# Patient Record
Sex: Female | Born: 1935 | Race: White | Hispanic: No | Smoking: Never smoker
Health system: Southern US, Community
[De-identification: ages and names within clinical notes are randomized; demographics above are authoritative.]

## PROBLEM LIST (undated history)

## (undated) DIAGNOSIS — N3020 Other chronic cystitis without hematuria: Secondary | ICD-10-CM

## (undated) DIAGNOSIS — R339 Retention of urine, unspecified: Secondary | ICD-10-CM

## (undated) DIAGNOSIS — F0390 Unspecified dementia without behavioral disturbance: Secondary | ICD-10-CM

## (undated) DIAGNOSIS — I10 Essential (primary) hypertension: Secondary | ICD-10-CM

## (undated) DIAGNOSIS — N3941 Urge incontinence: Secondary | ICD-10-CM

## (undated) DIAGNOSIS — N952 Postmenopausal atrophic vaginitis: Secondary | ICD-10-CM

## (undated) DIAGNOSIS — L89309 Pressure ulcer of unspecified buttock, unspecified stage: Secondary | ICD-10-CM

## (undated) DIAGNOSIS — I4891 Unspecified atrial fibrillation: Secondary | ICD-10-CM

## (undated) DIAGNOSIS — K219 Gastro-esophageal reflux disease without esophagitis: Secondary | ICD-10-CM

## (undated) DIAGNOSIS — R001 Bradycardia, unspecified: Secondary | ICD-10-CM

## (undated) DIAGNOSIS — E785 Hyperlipidemia, unspecified: Secondary | ICD-10-CM

## (undated) DIAGNOSIS — N390 Urinary tract infection, site not specified: Secondary | ICD-10-CM

## (undated) DIAGNOSIS — R3916 Straining to void: Secondary | ICD-10-CM

## (undated) DIAGNOSIS — Z950 Presence of cardiac pacemaker: Secondary | ICD-10-CM

## (undated) DIAGNOSIS — R3129 Other microscopic hematuria: Secondary | ICD-10-CM

## (undated) HISTORY — PX: PACEMAKER INSERTION: SHX728

---

## 1998-07-15 ENCOUNTER — Inpatient Hospital Stay (HOSPITAL_COMMUNITY): Admission: EM | Admit: 1998-07-15 | Discharge: 1998-07-22 | Payer: Self-pay | Admitting: Internal Medicine

## 2002-04-26 ENCOUNTER — Ambulatory Visit (HOSPITAL_COMMUNITY): Admission: RE | Admit: 2002-04-26 | Discharge: 2002-04-26 | Payer: Self-pay | Admitting: Internal Medicine

## 2002-04-26 ENCOUNTER — Encounter (INDEPENDENT_AMBULATORY_CARE_PROVIDER_SITE_OTHER): Payer: Self-pay | Admitting: Internal Medicine

## 2003-05-28 ENCOUNTER — Ambulatory Visit (HOSPITAL_COMMUNITY): Admission: RE | Admit: 2003-05-28 | Discharge: 2003-05-28 | Payer: Self-pay | Admitting: Family Medicine

## 2003-05-28 ENCOUNTER — Encounter: Payer: Self-pay | Admitting: Family Medicine

## 2003-10-13 ENCOUNTER — Inpatient Hospital Stay (HOSPITAL_COMMUNITY): Admission: EM | Admit: 2003-10-13 | Discharge: 2003-10-22 | Payer: Self-pay | Admitting: Emergency Medicine

## 2003-10-22 ENCOUNTER — Inpatient Hospital Stay: Admission: AD | Admit: 2003-10-22 | Discharge: 2003-11-07 | Payer: Self-pay | Admitting: Internal Medicine

## 2004-04-28 HISTORY — PX: CARDIAC CATHETERIZATION: SHX172

## 2004-04-30 ENCOUNTER — Ambulatory Visit (HOSPITAL_COMMUNITY): Admission: RE | Admit: 2004-04-30 | Discharge: 2004-04-30 | Payer: Self-pay | Admitting: *Deleted

## 2004-05-03 ENCOUNTER — Inpatient Hospital Stay (HOSPITAL_BASED_OUTPATIENT_CLINIC_OR_DEPARTMENT_OTHER): Admission: RE | Admit: 2004-05-03 | Discharge: 2004-05-03 | Payer: Self-pay | Admitting: Cardiology

## 2004-05-20 ENCOUNTER — Ambulatory Visit (HOSPITAL_COMMUNITY): Admission: RE | Admit: 2004-05-20 | Discharge: 2004-05-20 | Payer: Self-pay | Admitting: *Deleted

## 2005-03-09 ENCOUNTER — Ambulatory Visit (HOSPITAL_COMMUNITY): Admission: RE | Admit: 2005-03-09 | Discharge: 2005-03-09 | Payer: Self-pay | Admitting: *Deleted

## 2005-04-12 ENCOUNTER — Ambulatory Visit (HOSPITAL_COMMUNITY): Admission: RE | Admit: 2005-04-12 | Discharge: 2005-04-12 | Payer: Self-pay | Admitting: *Deleted

## 2005-06-14 ENCOUNTER — Ambulatory Visit (HOSPITAL_COMMUNITY): Admission: RE | Admit: 2005-06-14 | Discharge: 2005-06-14 | Payer: Self-pay | Admitting: Cardiovascular Disease

## 2005-08-04 ENCOUNTER — Emergency Department (HOSPITAL_COMMUNITY): Admission: EM | Admit: 2005-08-04 | Discharge: 2005-08-04 | Payer: Self-pay | Admitting: Emergency Medicine

## 2006-04-26 ENCOUNTER — Emergency Department (HOSPITAL_COMMUNITY): Admission: EM | Admit: 2006-04-26 | Discharge: 2006-04-26 | Payer: Self-pay | Admitting: Emergency Medicine

## 2006-04-27 ENCOUNTER — Ambulatory Visit: Payer: Self-pay | Admitting: Orthopedic Surgery

## 2006-06-08 ENCOUNTER — Ambulatory Visit: Payer: Self-pay | Admitting: Orthopedic Surgery

## 2006-09-16 ENCOUNTER — Inpatient Hospital Stay (HOSPITAL_COMMUNITY): Admission: EM | Admit: 2006-09-16 | Discharge: 2006-09-21 | Payer: Self-pay | Admitting: Emergency Medicine

## 2006-09-26 ENCOUNTER — Inpatient Hospital Stay (HOSPITAL_COMMUNITY): Admission: AD | Admit: 2006-09-26 | Discharge: 2006-09-30 | Payer: Self-pay | Admitting: Cardiovascular Disease

## 2007-01-03 ENCOUNTER — Ambulatory Visit (HOSPITAL_COMMUNITY): Admission: RE | Admit: 2007-01-03 | Discharge: 2007-01-03 | Payer: Self-pay | Admitting: Cardiovascular Disease

## 2007-12-13 ENCOUNTER — Ambulatory Visit (HOSPITAL_COMMUNITY): Admission: RE | Admit: 2007-12-13 | Discharge: 2007-12-13 | Payer: Self-pay | Admitting: Internal Medicine

## 2008-07-07 ENCOUNTER — Emergency Department (HOSPITAL_COMMUNITY): Admission: EM | Admit: 2008-07-07 | Discharge: 2008-07-07 | Payer: Self-pay | Admitting: Emergency Medicine

## 2008-12-25 ENCOUNTER — Ambulatory Visit (HOSPITAL_COMMUNITY): Admission: RE | Admit: 2008-12-25 | Discharge: 2008-12-25 | Payer: Self-pay | Admitting: Internal Medicine

## 2010-10-25 ENCOUNTER — Emergency Department (HOSPITAL_COMMUNITY)
Admission: EM | Admit: 2010-10-25 | Discharge: 2010-10-25 | Payer: Self-pay | Source: Home / Self Care | Admitting: Emergency Medicine

## 2011-04-15 NOTE — Discharge Summary (Signed)
[name], [name]                ACCOUNT NO.:  [id number]   MEDICAL RECORD NO.:  [id number]          PATIENT TYPE:  INP   LOCATION:  6524                         FACILITY:  [facility]   PHYSICIAN:  Raymon Mutton, P.A. DATE OF BIRTH:  [birth year]   DATE OF ADMISSION:  09/16/2006  DATE OF DISCHARGE:  09/21/2006                                 DISCHARGE SUMMARY   DISCHARGE DIAGNOSIS:  1. Sick sinus syndrome.  2. Paroxysmal atrial fibrillation.  3. Status post permanent transvenous pacemaker that is a dual-chambered      device implanted by Dr. Jenne Campus on September 19, 2006.  4. Peripheral vascular disease with a history of left internal carotid      artery stenosis.  5. Depression.  6. Anticoagulation therapy with Coumadin.   HOSPITAL COURSE:  This is a 77 year old Caucasian female who was seen in the  office with complaints of weakness and dizziness.  She was wearing an event  monitor revealing 5- to 6-second pauses with intermittent episodes of  tachycardia.  Based on that finding, patient was scheduled for the pacer  implant.  She actually was requested to come to [facility] after  her event monitor showed  a systolic pauses.  Because patient was on  Coumadin, she was admitted for the Coumadin Heparin bridge.  Her INR drifted  to 1.5 on September 18, 2006, and then into the next day, Dr. Jenne Campus  performed a pacer placement.  It was the mid joining device serial number  vagal lead is [id number] and ventricular lead serial number [id number].  Generator was in the rhythm device with serial number [id number].  Patient  tolerated the procedure well.  The next morning, the pacer was interrogated  and showed all normal parameters, no changes were made.  Patient underwent  chest x-ray on the morning of September 20, 2006, and x-ray showed a possible  small pneumothorax in the upper left lobe.  Patient remained asymptomatic.  She did not have any shortness of breath, or any chest pain.   We held her in  the hospital for observation 24 extra hours and the next morning on September 21, 2006, she underwent another x-ray that showed no changes in appearance  of the left apical area.  Patient remained stable.  Her pacer site did not  have any signs of inflammation or infection and Steri-Strips remained  intact.   DISCHARGE MEDICATIONS:  1. Lisinopril 5 mg every day.  2. Lexapro 10 mg every day.  3. Omeprazole 20 mg every day.  4. Cardizem 240 mg every day.  5. Lopressor 25 mg a day.  6. Coumadin as before, home dose.   DISCHARGE DIET:  Low-salt, low-fat diet.   DISCHARGE INSTRUCTIONS:  Patient was instructed to keep incision dry and do  not remove Steri-Strips unless they roll off themselves.  Avoid any overhead  reaching for a week and do not wet incision site until seen in the office.   FOLLOWUP:  Followup appointment for pacer site check up scheduled on September 27, 2006, at 2:30 and she will be seen by  a PA, [facility].   Patient will have her INR checked in a week prior to coming to our office.      Raymon Mutton, P.A.     MK/MEDQ  D:  09/21/2006  T:  09/22/2006  Job:  [id number]   cc:   [facility]  Darlin Priestly, MD  Dani Gobble, MD

## 2011-04-15 NOTE — Cardiovascular Report (Signed)
NAME:  [name], [name]                          ACCOUNT NO.:  [id number]   MEDICAL RECORD NO.:  [id number]                   PATIENT TYPE:  OIB   LOCATION:  6501                                 FACILITY:  [facility]   PHYSICIAN:  Salvadore Farber, M.D. [facility]         DATE OF BIRTH:  07/11/1927   DATE OF PROCEDURE:  05/03/2004  DATE OF DISCHARGE:  05/03/2004                              CARDIAC CATHETERIZATION   PROCEDURE:  Left and right heart catheterization, left ventriculography,  coronary angiography.   INDICATION:  [name] is a 77 year old lady who presented in heart failure  in the setting of paroxysmal atrial fibrillation in November.  Ejection  fraction was demonstrated to be approximately 25% by echocardiography.  She  has continued to suffer profound fatigue.  She has been referred for right  and left heart catheterization to assess pressures and exclude an etiology  to her heart failure.   PROCEDURAL TECHNIQUE:  Informed consent was obtained.  Under 1% lidocaine  local anesthesia, a 4 French sheath was placed in the right common femoral  artery and a 7 French sheath in the right common femoral vein using the  modified Seldinger technique.  Right heart catheterization was performed  using a balloon tipped catheter.  Pressures were measured and cardiac output  was calculated using a thermodilution method.  Coronary angiography was then  performed using JL-4 and JR-4 catheters.  Finally, left heart  catheterization and ventriculography was performed using a pigtail catheter.  The patient tolerated the procedure well and was transferred to the holding  room in stable condition.  Sheaths will be removed there.   COMPLICATIONS:  None.   FINDINGS:   HEMODYNAMICS:  RA 5/3/2, RV 30/0/4, PA 28/7/15, PCW 11/8/8, LV 145/2/12,  aorta 136/46/77, cardiac output/index 3.7/2.4.   VENTRICULOGRAPHY:  EF 50% without regional wall motion abnormality.  No  mitral regurgitation.   CORONARY  ANGIOGRAPHY:  Fluoroscopy demonstrates the proximal portions of the  LAD and circumflex to be heavily calcified.  1. Left main:  Angiographically normal.  2. LAD:  Heavily calcified proximally, but only a 20% stenosis of the mid     vessel.  The LAD is a large vessel wrapping the apex of the heart to     supply much of the inferior wall.  It gives rise to a single diagonal     branch.  3. Circumflex:  Moderate size vessel giving rise to two obtuse marginals.     It is heavily calcified proximally, but angiographically normal.  4. RCA:  Moderate size dominant vessel.  It is angiographically normal.   IMPRESSION/RECOMMENDATIONS:  There is no significant coronary disease.  Left  ventricular systolic function is improved markedly compared with  echocardiogram November 2004.  Her filling pressures are markedly low which  may be contributing to her fatigue.  After discussion with Dr. Dorethea Clan, I  will discontinue her Lasix and K-Dur.  She will follow  up with Dr. Dorethea Clan in  approximately two weeks.                                               Salvadore Farber, M.D. [facility]    WED/MEDQ  D:  05/03/2004  T:  05/03/2004  Job:  [id number]   cc:   Tesfaye D. Felecia Shelling, M.D.  [address]  [place]  [place] [address]  Fax: [phone]   Vida Roller, M.D.  Fax: [phone]

## 2011-04-15 NOTE — Procedures (Signed)
NAME:  [name], [name]                          ACCOUNT NO.:  [id number]   MEDICAL RECORD NO.:  [id number]                   PATIENT TYPE:  INP   LOCATION:  IC04                                 FACILITY:  [facility]   PHYSICIAN:  Vida Roller, M.D.                DATE OF BIRTH:  Dec 25, 1926   DATE OF PROCEDURE:  DATE OF DISCHARGE:                                  ECHOCARDIOGRAM   PRIMARY CARE Breana Litts:  Tesfaye D. Felecia Shelling, M.D.   TAPE NUMBER:  [id number].   TAPE COUNT:  [id number] through [phone].   This is a 77 year old woman with atrial fibrillation and congestive heart  failure.  The technical quality of the study is adequate.  The patient is in  atrial fibrillation.   M-MODE TRACINGS:  The aorta is 31 mm.   The left atrium is 44 mm.   The septum is 10 mm.   The posterior wall is 10 mm.   The left ventricular diastolic dimension is 48 mm.   The left ventricular systolic dimension is 41 mm.   2-D AND DOPPLER IMAGING:  The left ventricle is mildly dilated with evidence  of severely depressed left ventricular ejection fraction.  The overall  ejection fraction is estimated at 25-30%.  There is global hypokinesis with  no areas of akinesis.  Diastolic function is not assessed due to the  severity of the systolic function.   The right ventricle appears to be normal size with normal systolic function.   Both atriae appear to be moderately enlarged.  There is no atrioseptal  defect.   The aortic valve is mildly sclerotic with evidence of mild insufficiency and  no stenosis is seen.   The mitral valve shows mild annular calcification with evidence of mild to  moderate mitral regurgitation.   The tricuspid valve is morphologically unremarkable with moderate  insufficiency.  No stenosis is seen.   The pulmonic valve was not well seen.   The ascending aorta was not well seen.   The pericardial structures appear normal.  There is a very small  circumferential pericardial effusion of no  significance.  There is, however,  a small pericardial effusion and two very large pleural effusions.   The inferior vena cava appears to be dilated and mildly plethoric.      ___________________________________________                                            Vida Roller, M.D.   JH/MEDQ  D:  10/14/2003  T:  10/14/2003  Job:  [id number]

## 2011-04-15 NOTE — H&P (Signed)
NAMEBARBARAANN, AVANS                ACCOUNT NO.:  [id number]   MEDICAL RECORD NO.:  [id number]          PATIENT TYPE:  INP   LOCATION:  2916                         FACILITY:  [facility]   PHYSICIAN:  Nicki Guadalajara, M.D.     DATE OF BIRTH:  05/05/1927   DATE OF ADMISSION:  09/16/2006  DATE OF DISCHARGE:                                HISTORY & PHYSICAL   HISTORY OF PRESENT ILLNESS:  Mrs. [name] is a 77 year old white  widowed female patient who came to the emergency room by our request, after  we were called by [facility] because of a rhythm strip showing PAF with rapid  ventricular response and sick sinus syndrome with 5-6 second pauses.  She  was recently seen by Dr. Domingo Sep in the office on September 04, 2006, at which  time she complained of dizziness, tachy palpitations and pre-syncope.  I  believe her Cardizem at that time was decreased.  She does have a history of  mild CAD and ASPVD, with left internal carotid artery stenosis, 50-70%.  She  was ordered as an outpatient to have an echo and stress Myoview; I do not  believe these have been done yet.  She states she does feel like she may  pass out at times.  She is not specifically having any chest pain or  shortness of breath at this time.  Her strip shows that she had her pauses  during the night, at approximately 12:30 and 3 a.m. in the morning.  Currently here in the emergency room she is in sinus rhythm to sinus brady,  rate of 50-60.   Her labs show her hemoglobin is 12, hematocrit is 36.4 and platelets are 217  with WBC of 6.  Her INR is 1.9.  BNP was 303.  Magnesium was 2.3.  BMET is  not back yet.   PAST MEDICAL HISTORY:  1. ASCVD/atherosclerotic coronary vascular disease with a history of      cardiac catheterization in 2005 with heavily calcified proximal      portions of the left anterior descending and circumflex, where the left      main was angiographically normal.  The LAD had a 20% mid vessel      stenosis.  She  had no significant disease in the circumflex or right      coronary artery.  A 2-D echocardiogram done in April of 2006 showed      mild to moderate LAE, as well as mild aortic sclerosis without      stenosis.  She had a normal left ventricular size and vigorous LV      systolic function.  She had mild aortic insufficiency, mild mitral      regurgitation and tricuspid regurgitation.  2. ASPVD/atherosclerotic peripheral vascular disease with history of left      internal carotid stenosis, 50-70%.  3. History of depression.  4. History of paroxysmal atrial fibrillation.   MEDICATIONS:  Lisinopril 5 mg daily, diltiazem 120 mg daily, benazepril 20  mg daily, Remeron 15 mg at bedtime, warfarin 4 mg daily, and Lexapro 10 mg  daily.   ALLERGIES:  NKDA.   FAMILY HISTORY:  She, herself, has coronary disease and is 77 years old.   SOCIAL HISTORY:  She is widowed.  She has 4 children; 2 children she does  not see.  She lives alone in an apartment complex for the elderly.  She does  do housework.  She cleans and cooks, and drives around [place].   REVIEW OF SYSTEMS:  Positive for exertional dyspnea at times.  Positive for  lightheadedness.  Positive for pre-syncope.  No chest pain.  Positive for  depression.  No fever.  No cough or cold.  No indigestion or heartburn.  No  black, tarry stools.   PHYSICAL EXAMINATION:  VITAL SIGNS:  Blood pressure is 98/50, pulse is 76,  and respirations are 18.  GENERAL:  She has a flat affect.  No chest pain.  HEENT:  Pupils equal, round and reactive to light.  NECK:  Left carotid bruit.  No JVD.  RESPIRATORY:  Respirations are without rales.  HEART:  Sounds are regular.  1/6 systolic ejection murmur without S3.  ABDOMEN:  Bowel sounds are present times four.  No mass.  No tenderness.  EXTREMITIES:  Moves all extremities times four.  NEUROLOGIC:  Alert and oriented times three.  LYMPHATICS:  No lymphadenopathy.   ASSESSMENT:  1. Sick sinus syndrome  with paroxysmal atrial fibrillation, rapid      ventricular response and 5-6 second pauses.  2. Mild coronary artery disease without chest pain at this time.  3. Anticoagulation, on Coumadin with an INR of 1.9.  4. Normal left ventricular function.  5. Depression.  6. History of atherosclerotic peripheral vascular disease with history of      left internal carotid stenosis.   PLAN:  She was seen and evaluated by Dr. Nicki Guadalajara.  She will be  admitted.  She will need to undergo pacemaker implantation; this will be  scheduled for Tuesday.  Her INR now is 1.9; thus, we will place her on IV  heparin and hold her Coumadin for her PTVDP placement.  We will also hold  her Cardizem-CD and will put her on low-dose Cardizem, short-acting, 30 mg  every 8 hours; only hold if her heart rate is less than 60.      Lezlie Octave, N.P.    ______________________________  Nicki Guadalajara, M.D.    BB/MEDQ  D:  09/16/2006  T:  09/16/2006  Job:  [id number]   cc:   Tesfaye D. Felecia Shelling, MD  Dani Gobble, MD

## 2011-04-15 NOTE — Group Therapy Note (Signed)
NAME:  JAHNI, PAUL                          ACCOUNT NO.:  [id number]   MEDICAL RECORD NO.:  [id number]                   PATIENT TYPE:  INP   LOCATION:  IC04                                 FACILITY:  [facility]   PHYSICIAN:  Edward L. Juanetta Gosling, M.D.             DATE OF BIRTH:  1927/06/21   DATE OF PROCEDURE:  10/16/2003  DATE OF DISCHARGE:                                   PROGRESS NOTE   SUMMARY:  Ms. Marmo says she is feeling okay and has no complaints.  She  remains sluggish and just does not feel well but she does not have any  specific complaints at this time.  Her thoracentesis was done last night and  the fluid looks mostly transudative.  This would be consistent with her  congestive heart failure.  Otherwise, she is doing okay and has no new  complaints.  At this point I will plan to sign off unless something comes up  on the pleural fluid that is unexpected.  Thank you for allowing me to see  her with you.      ___________________________________________                                            Oneal Deputy. Juanetta Gosling, M.D.   ELH/MEDQ  D:  10/16/2003  T:  10/16/2003  Job:  [id number]

## 2011-04-15 NOTE — Op Note (Signed)
[name], DOSHIER                ACCOUNT NO.:  [id number]   MEDICAL RECORD NO.:  [id number]          PATIENT TYPE:  INP   LOCATION:  6524                         FACILITY:  [facility]   PHYSICIAN:  Darlin Priestly, MD  DATE OF BIRTH:  December 08, 1926   DATE OF PROCEDURE:  09/19/2006  DATE OF DISCHARGE:                                 OPERATIVE REPORT   PROCEDURE:  Insertion of a Medtronic EnRhythm generator, model M8124565,  serial [id number], with passive atrial and ventricular leads.   ATTENDING SURGEON:  Darlin Priestly, MD.   COMPLICATIONS:  None.   INDICATIONS:  Miss [name] is a 77 year old female patient of Dr. Reita Chard with a history of PVD, history of noncritical CAD, history of  paroxysmal atrial fibrillation, who recently wore an event monitor revealing  5 to 6-second pauses with intermittent episodes of tachycardia.  She is now  referred for a generator placement secondary to sick sinus syndrome.   DESCRIPTION OF PROCEDURE:  After the patient gave informed written consent,  the patient was brought to the cardiac cath lab, where the left chest was  prepped and draped in sterile fashion.  ECG monitoring was established.  Lidocaine 1% was then used to anesthetize the left midsubclavicular area.  Next, approximately a 3-cm midinfraclavicular incision was then carried out  in a horizontal fashion, and hemostasis was obtained with electrocautery.  Blunt dissection was then used to carry this down to the left pectoral  fascia.  Next, approximately a 3 x 4-cm pocket was then created over the  left pectoral fascia, and again hemostasis was confirmed with  electrocautery.  The left subclavian vein was then visualized with contrast,  and using fluoroscopy as well as a Smart needle, the left subclavian vein  was then entered and a guide wire was then easily passed into the SVC and  right atrium.  Over this, a #9-French stay sheath was inserted, and the  dilator and guide wire  were removed.  Through this, a 52-cm passive  Medtronic lead, model [id number], serial # [id number], was then passed into the  right atrium.  The guide wire was retained and the peel-away sheath was  removed.  A second 7-French dilator and sheath were then placed over the  retained guide wire, and the guide wire and dilator were removed.  Through  this, a 45-cm passive Medtronic lead, model #5594, serial [id number], was  then passed into the right atrium and the peel-away sheath was removed.  Two  silk sutures were then placed at the base of each lead and anchored to the  pectoral fascia.  A J curve was then placed on the ventricular lead stylet  and the ventricular lead was then allowed to pass through the tricuspid  valve and was positioned in the RV apex without difficulty.  Thresholds were  determined.  R waves were measured at 9.5 mV.  Impedance was 807 ohms.  Threshold in the ventricle was 0.4 V at 0.5 msec.  Current was 0.5 mA, and  10 V was negative for diaphragmatic stimulation.  The atrial stylet was  then  pulled back and the preformed J was then allowed to form, and this was  positioned in the right atrial appendage and the thresholds were determined.  The P waves were measured at 1.7 mA.  Impedance was 489 ohms.  Threshold in  the atrium was 1.2 V at 0.5 msec.  Current was 2.9 mA, and 10 V was negative  for diaphragmatic stimulation.  The leads were then sutured into place with  two silk sutures per lead.  The pocket was then copiously irrigated with 1%  kanamycin solution, and again hemostasis was confirmed.  The leads were then  connected in a serial fashion to a Medtronic EnRhythm [id number] generator,  serial [id number].  Header screws were tightened and pacing was confirmed.  A single silk suture was then placed in the apex of the pocket.  The  generator and the leads were then delivered into the pocket, and the header  was secured to the silk suture.  The subcutaneous layer was  then closed  using running 2-0 Vicryl.  The skin layers were then closed using running 4-  0 Vicryl.  Steri-Strips were then applied.  The patient returned to the  recovery room in stable condition.   CONCLUSIONS:  Successful implant of a Medtronic EnRhythm [id number] generator,  serial [id number], with passive atrial and ventricular leads.      Darlin Priestly, MD  Electronically Signed     RHM/MEDQ  D:  09/19/2006  T:  09/19/2006  Job:  [id number]   cc:   Dani Gobble, MD  Tesfaye D. Felecia Shelling, MD

## 2011-04-15 NOTE — Consult Note (Signed)
NAME:  [name], [name]                          ACCOUNT NO.:  [id number]   MEDICAL RECORD NO.:  [id number]                   PATIENT TYPE:  INP   LOCATION:  IC04                                 FACILITY:  [facility]   PHYSICIAN:  Vida Roller, M.D.                DATE OF BIRTH:  1927-11-01   DATE OF CONSULTATION:  10/14/2003  DATE OF DISCHARGE:                                   CONSULTATION   PRIMARY CARE PHYSICIAN:  Tesfaye D. Felecia Shelling, M.D.   HISTORY OF PRESENT ILLNESS:  [name] is a 77 year old female who  presented to the emergency department secondary to failure-to-thrive.  She  was found down in her apartment by EMS and taken to the ED at [facility] where she was found to have atrial fibrillation with a rapid  ventricular response.  She was also severely dehydrated and hyponatremic.  Also complains of a pleuritic cough with discomfort in her chest and  shortness of breath, some lower extremity edema.   PAST MEDICAL HISTORY:  Her past medical history is significant for  hysterectomy diverticulitis, and a history of a hiatal hernia but no  congestive heart failure or coronary artery disease.   SOCIAL HISTORY:  She lives alone in [place].  She is widowed.  She has  four children, none of whom are active in her health care.   ALLERGIES:  She is not allergic to any medications.   HOME MEDICATIONS:  She is on Xanax 0.5mg  a day at night to sleep and iron  replacement for an unknown cause.   HABITS:  She does not smoke, does not drink alcohol, does not do any drugs.   FAMILY HISTORY:  Her family history is limited.  She has one son who has  heart disease status post a bypass surgery at age 67 and her mother died of  thyroid and heart disease at the age of 39.  Her father died in his 22s -  she is not certain of the cause.   HOSPITAL MEDICATIONS:  1. Rocephin 1 gram q.24h.  2. Lovenox 60 mg subcu b.i.d.  3. Remeron 15 mg at night for sleep.  4. Potassium chloride 40  mEq p.o. q.4h.  5. Cardizem drip which is on a p.r.n. status for heart rate greater than     100; she is currently not receiving any of that medication.  6. She is also on Tylenol and Xanax p.r.n.   REVIEW OF SYSTEMS:  Her review of systems is really not particularly  illustrative.  She does complain of significant weakness, generalized, and a  relatively severe amount of depression although she has no suicidal  ideations.  She also complains of some mild back pain and constipation.   PHYSICAL EXAMINATION:  GENERAL:  She is a cachectic, elderly female lying in  bed in the intensive care unit.  She is able to give a reasonably  good  history although she does not remember specific issues regarding her health  care in the past.  VITAL SIGNS:  She is afebrile.  Her pulse is 99 and irregular in atrial  fibrillation.  Her respiratory rate is 24, blood pressure is 101/47, and her  weight is 124 pounds.  She is saturating at 92% on 2 liters nasal cannula.  HEENT:  Unremarkable.  She does have some temporal wasting.  NECK:  Supple.  She has no jugular venous distention or carotid bruits.  CHEST:  Diminished breath sounds bilaterally in the lower lung fields but no  obvious rales.  HEART:  Irregularly irregular heart with no murmurs, rubs, or gallops.  ABDOMEN:  Soft, nontender, with normal active bowel sounds.  No obvious  hepatosplenomegaly, no masses are noted.  GENITOURINARY AND RECTAL:  Deferred.  EXTREMITIES:  She has 1-2+ edema in her lower extremities; no upper  extremity edema.  MUSCULOSKELETAL:  Not revealing.  NEUROLOGIC:  Generally nonfocal.   LABORATORY DATA:  Head CT shows mild atrophy but no obvious fracture or  bleed.  Her chest x-ray is pending.  Electrocardiogram reveals atrial  fibrillation at a rate of 154 with normal axes.  Her QRS duration is 73  milliseconds, her QT duration is 429 milliseconds.  She has nonspecific ST-T  wave changes and poor R wave progression but no  obvious Q waves concerning  for a myocardial infarction and no ST-T wave changes significant for  ischemia.  Laboratories:  White blood cell count is 8.5; H&H are 12 and 37  with a platelet count of 280,000.  Her sodium when she presented was 115; it  is now 125.  Her potassium when she presented was 2.9; it is now 3.3.  Chloride is 92, her bicarbonate is 25, BUN and creatinine are 13 and 0.9,  with a blood sugar of 85.  She has mildly abnormal transaminases.  Her first  set of cardiac enzymes is not consistent with acute myocardial infarction.  Her total protein 6.0, albumin is 2.8, and her B-type natriuretic peptide  when she presented was 574.   ASSESSMENT AND RECOMMENDATIONS:  So this is an elderly woman who essentially  has failure-to-thrive, relatively ill.  She has atrial fibrillation with a  rapid ventricular response.  Her heart rates are now in the low 90s without  any significant medications and she is doing better with mild hydration.  Her electrolyte abnormality is also significant and probably needs to have  aggressive rehydration and supplementation and also check a magnesium level  on her.  With regard to her congestive heart failure this is likely  secondary to her rapid ventricular response.  Will check an echocardiogram  to make sure she does not have any systolic dysfunction but I anticipate  that she will have a normal left ventricular function and really aggressive  treatment of her heart rate would be, I think, reasonable at this point.  With regard to anticoagulation status with her, I think what we probably  need to do is aggressively address placement issues and once this has been  addressed we can determine whether or not to anticoagulate her.  If she is  going to remain at home - which I would strongly recommend against - then  probably Coumadin therapy is not reasonable as she is a significant fall risk.  If, however, she is placed in a nursing facility - which  is optimal  for her - then I think oral anticoagulation  with warfarin would be a  reasonable thing to do.      ___________________________________________                                            Vida Roller, M.D.   JH/MEDQ  D:  10/14/2003  T:  10/14/2003  Job:  [id number]

## 2011-04-15 NOTE — H&P (Signed)
NAME:  [name], [name]                          ACCOUNT NO.:  [id number]   MEDICAL RECORD NO.:  [id number]                   PATIENT TYPE:  INP   LOCATION:  IC04                                 FACILITY:  [facility]   PHYSICIAN:  Kingsley Callander. Ouida Sills, M.D.                  DATE OF BIRTH:  1926-12-01   DATE OF ADMISSION:  10/13/2003  DATE OF DISCHARGE:                                HISTORY & PHYSICAL   CHIEF COMPLAINT:  Weakness.   HISTORY OF PRESENT ILLNESS:  This patient is a 77 year old white female who  presented to the emergency room with generalized weakness.  The patient  lives by herself and has been unable to care for herself.  She has developed  swelling in her legs.  Her chief complaint besides weakness has been cough.  She denies fever or sputum production.  Upon evaluation in the emergency  room, she was found to be in rapid atrial fibrillation with a heart rate in  the 150s.  She has no prior history of MI or congestive heart failure.  She  was also found to be severely hyponatremic at 115.  Her son reports that she  does drinks a lot of water.   PAST MEDICAL HISTORY:  1. Hysterectomy.  2. Diverticulitis in 1999.  She underwent a colonoscopy then.  3. Hiatal hernia.   MEDICATIONS:  1. Xanax 0.5 q.h.s.  2. Iron.   ALLERGIES:  None.   SOCIAL HISTORY:  She does not smoke cigarettes, use alcohol, or use illicit  drugs.   FAMILY HISTORY:  Her son has had bypass surgery.   REVIEW OF SYMPTOMS:  She has had orthopnea and PND.  She has not experienced  chest pain or abdominal pain.  She did have a fall approximately six months  ago and has not been the same since then.  She has also fallen once within  the past week.   PHYSICAL EXAMINATION:  VITAL SIGNS:  Temperature 94.3, blood pressure 99/81,  heart rate initially 154, respirations 22.  Pulse ox 96%.  GENERAL:  A shallow-appearing, ill-appearing elderly white female with slow  speech.  HEENT:  She has conjunctival edema.   Pharynx is moist.  NECK:  Moderate JVD but no thyromegaly.  LUNGS:  Basilar rales.  HEART:  Tachycardic and irregularly irregular.  ABDOMEN:  Soft and nontender with no hepatosplenomegaly.  EXTREMITIES:  Pitting edema 4+ in the lower legs and feet.  No clubbing or  cyanosis.  NEURO:  She is very weak and unable to sit up on her own.  Her speech is  slow.  SKIN:  Intact.   LABORATORY DATA:  White count 8.5, hemoglobin 12.1, platelets 280, 75 segs,  16 lymphs, 4 monos, 5 bands.  Sodium 115, potassium 5.0, chloride 85, bicarb  21, glucose 117, BUN 19, creatinine 1.0, calcium 8.4, CK 212 with an MB of  11.6.  Troponin I 0.03.  SGOT 87, SGPT 60, albumin 2.8, bilirubin 1.0.   EKG reveals atrial fibrillation with a rapid ventricular response of 154  initially.  Poor R wave progression and nonspecific T wave changes.   Chest x-ray reveals bibasilar effusions.   A CT scan of the head reveals mild atrophy but no fracture or bleed.   IMPRESSION:  1. Rapid atrial fibrillation:  She has been treated in the ER with IV     Diltiazem.  She will be hospitalized in the CCU and will be continued on     an IV Diltiazem drip.  An echocardiogram and TSH will be obtained.  2. Bilateral pleural effusions.  3. Hyponatremia:  Will restrict free water.  Check a urine sodium.  Treat     her gently with IV of normal saline and treat her with a dose of IV Lasix     now.  A BNP will be checked as well.  4. Elevated transaminases:  Likely due to passive congestion.  5. Altered mental status/weakness:  Will further assess with improvement in     her serum sodium.  6. Cough:  Will treat empirically with IV Rocephin.     ___________________________________________                                         Kingsley Callander. Ouida Sills, M.D.   ROF/MEDQ  D:  10/13/2003  T:  10/13/2003  Job:  [id number]   cc:   Tesfaye D. Felecia Shelling, M.D.  [address]  [place]  [place] [address]  Fax: [phone]

## 2011-04-15 NOTE — Consult Note (Signed)
NAME:  [name], [name]                          ACCOUNT NO.:  [id number]   MEDICAL RECORD NO.:  [id number]                   PATIENT TYPE:  INP   LOCATION:  IC04                                 FACILITY:  [facility]   PHYSICIAN:  Edward L. Juanetta Gosling, M.D.             DATE OF BIRTH:  12/30/1926   DATE OF CONSULTATION:  10/15/2003  DATE OF DISCHARGE:                                   CONSULTATION   HISTORY OF PRESENT ILLNESS:  Ms. Clermont is a 77 year old who came to the  emergency room simply because she was not doing well.  She was not able to  get up out of bed and actually was found by EMS in her apartment on the  floor.  She was noted to have atrial fibrillation with a rapid ventricular  response.  She was dehydrated, hyponatremic.  She says she has had a cough  for about a month, but feels a little bit better today.  She has a previous  history of diverticulitis and a hiatal hernia.  No known history of  coronary disease, congestive heart failure, lung problems, and surgically,  she has had a hysterectomy.   FAMILY HISTORY:  Positive for fairly early coronary disease.  Her mother  died of thyroid problems and heart disease in her 33's.  Father also died in  his 83's, she is unsure of the cause.   SOCIAL HISTORY:  She is widowed.  She lives at home alone.  She does not  smoke or drink any alcohol.   ALLERGIES:  She says that she is allergic to something, but she is not quite  sure what.   REVIEW OF SYSTEMS:  Negative.   PHYSICAL EXAMINATION:  GENERAL:  A thin, almost cachectic female.  VITAL SIGNS:  Pulse 100 to 120.  She is in atrial fibrillation.  Respirations are around 20, blood pressure 110/70.  O2 saturation in the  90's.  HEENT:  Pupils are reactive to light and accommodation.  Nose and throat are  clear.  NECK:  Supple without masses.  CHEST:  Decreased breath sounds.  HEART:  Irregular without murmur.  ABDOMEN:  Soft, without masses.  EXTREMITIES:  No edema.  CNS:   Grossly intact.   STUDIES:  CT of the brain which showed atrophy but no acute changes.  Electrocardiogram shows atrial fibrillation.  On the EKG, heart rate was  154.  Dr. Dorethea Clan has done an echocardiogram which shows what appears to be  bilateral pleural effusion and a portable chest also shows bibasilar  infiltrates, bilateral pleural effusion, mild cardiomegaly.  Bilateral  decubitus films have been ordered to make sure this is free flowing pleural  fluid.  These have not been done yet.   LABORATORY DATA:  White count 8500, hemoglobin 12, platelets 280.  Sodium  115 on admission, it has come up.  Potassium on admission was 2.9, it is  also better.  Chloride 92, CO2 25, BUN 13, creatinine 0.9.  Blood sugar 85.  Cardiac enzymes not consistent with acute myocardial infarction.  BNP 574.   ASSESSMENT:  My assessment is that she probably does have pleural effusions  that I suspect are related to congestive heart failure, considering her  overall situation.  Dr. Dorethea Clan has requested that she have a thoracentesis  and I will be glad to perform that, but I would like to see what the  decubitus films look like first.      ___________________________________________                                            Oneal Deputy. Juanetta Gosling, M.D.   ELH/MEDQ  D:  10/15/2003  T:  10/16/2003  Job:  [id number]

## 2011-04-15 NOTE — Discharge Summary (Signed)
[name], [name]                ACCOUNT NO.:  [id number]   MEDICAL RECORD NO.:  [id number]           PATIENT TYPE:   LOCATION:                                 FACILITY:   PHYSICIAN:  Abelino Derrick, P.A.   DATE OF BIRTH:  09-Jun-1927   DATE OF ADMISSION:  09/26/2006  DATE OF DISCHARGE:  09/29/2006                               DISCHARGE SUMMARY   DISCHARGE DIAGNOSES:  1. Left upper extremity deep venous thrombosis after a pacemaker,      Coumadin instituted this admission.  2. Paroxysmal atrial fibrillation and sick sinus syndrome status post      recent implant of __________.  3. Coronary disease.  Catheterization in 2005 showing moderate      coronary disease we treated medically with normal left ventricular      function.  4. Peripheral vascular disease with a 50-70% left internal carotid      artery stenosis.  5. History of depression.  6. Dyslipidemia.   HOSPITAL COURSE:  The patient is a 77 year old female followed by Dr.  Domingo Sep and Dr. Felecia Shelling.  Recently she was admitted and had a [employer]  pacemaker implanted for PAF and sick sinus syndrome.  She had been on  Coumadin.  She was discharged September 21, 2006.  Her home dose of  Coumadin was resumed.  She was seen in the office September 26, 2006 with  discomfort and shortness of breath, and had increased venous markings on  her left chest.  She was sent for Doppler in the office which showed a  left axilla DVT.  Her INR was subtherapeutic at 1.14.  Her pacer site  looked good.  Interestingly, her pacemaker was interrogated in the  office, and she did have one episode of PAF since discharge.  She was  admitted to telemetry, started on IV heparin and Coumadin.  Cardizem was  increased for recurrent PAF episodes.  Her rhythm stabilized.  We felt  she could be discharged September 30, 2006 with an INR 2.1.   MEDICATIONS AT DISCHARGE:  1. Coumadin 5 mg a day or as directed.  2. Lisinopril 5 mg a day.  3. Lexapro 10 mg a  day.  4. Omeprazole 20 mg a day.  5. Diltiazem 300 mg daily.  6. Vytorin 10/20 daily.  7. Metoprolol 25 mg twice a day.  8. Remeron 15 mg h.s.   CT of the chest showed no evidence of pulmonary embolism.  There was a 1  cm pulmonary nodule in the right upper lung zone consistent with  postinflammatory etiology.  There were some left upper quadrant  abdominal varices and small gallstones noted as well.  Chest was stable.  There was no pneumothorax.   LABORATORY DATA:  Labs at discharge:  White count 6.3, hemoglobin 10.7,  hematocrit 31.1, platelets 215.  INR 2.1.  Sodium 135, potassium 3.9,  BUN 20, creatinine 0.8.  AST 44, ALT 40.   DISPOSITION:  The patient is discharged in stable condition, and will  follow up with Dr. Felecia Shelling and Dr. Domingo Sep.  She will need a protime  by  Dr. Letitia Neri office with an INR goal of 2-3.  This will be arranged for a  couple days after discharge.      Abelino Derrick, P.ALenard Lance  D:  02/12/2008  T:  02/12/2008  Job:  [id number]   cc:   Tesfaye D. Felecia Shelling, MD

## 2011-04-15 NOTE — Procedures (Signed)
NAME:  [name], [name]                    ACCOUNT NO.:  [id number]   MEDICAL RECORD NO.:  [id number]          PATIENT TYPE:  OUT   LOCATION:  RAD                           FACILITY:  [facility]   PHYSICIAN:  Dani Gobble, MD       DATE OF BIRTH:  February 08, 1927   DATE OF PROCEDURE:  03/09/2005  DATE OF DISCHARGE:                                  ECHOCARDIOGRAM   INDICATIONS:  Congestive heart failure.   STUDY:  The technical quality of the study is adequate.   FINDINGS:  Aorta is within normal limits at 3 cm.   The left atrium is mild to moderately dilated at 4.5 cm.  No obvious clots  or masses were appreciated.  The patient appeared to be in atrial  fibrillation with a slow ventricular response in the 40's.   The interventricular septum and posterior wall within normal limits at 1.1  cm and 1 cm respectively.   The aortic valve appeared to be trileaflet and minimally thickened but with  normal leaflet excursion.  Peak velocity was 1.8 meters per second  corresponding to a peak gradient of 12 mmHg and a mean gradient of 8 mmHg  consistent with mild aortic sclerosis without stenosis.  There was  eccentric, mild nodular thickening of the right coronary cusp. She had mild  to moderate aortic insufficiency noted (more on the mild side).   The mitral valve appeared mildly thickened with mild doming morphology.  However, leaflet excursion was normal and there was no true suggestion of  mitral stenosis.  There was no mitral valve prolapse.  There was mild mitral  regurgitation noted.  Doppler interrogation of the mitral valve was within  normal limits.   Pulmonic valve was not well visualized.   The tricuspid valve appeared grossly structurally normal with mild tricuspid  regurgitation noted and estimated RVSP 40 mmHg.   The left ventricle was normal in size with the LVIDD measured at 5.1 cm and  LVISD measured at 3.2 cm.  Overall left ventricular systolic function was  normal to vigorous and no  regional abnormalities were noted.   The right atrium and right ventricle appeared normal in size and right  ventricular systolic function was normal.   IMPRESSION:  1.  Mild to moderate left atrial enlargement.  2.  Mild aortic sclerosis without stenosis.  3.  Mild eccentric jet of aortic insufficiency coursing along the anterior      leaflet of the mitral valve.  4.  Mild mitral regurgitation.  5.  Mild tricuspid regurgitation.  6.  Estimated right ventricular systolic pressure  of approximately 40 mmHg.  7.  Normal left ventricular size and vigorous left ventricular systolic      function without regional wall motion abnormality noted.      AB/MEDQ  D:  03/09/2005  T:  03/10/2005  Job:  [id number]

## 2011-04-15 NOTE — Discharge Summary (Signed)
NAME:  [name], [name]                          ACCOUNT NO.:  [id number]   MEDICAL RECORD NO.:  [id number]                   PATIENT TYPE:  INP   LOCATION:  A210                                 FACILITY:  [facility]   PHYSICIAN:  Tesfaye D. Felecia Shelling, M.D.              DATE OF BIRTH:  29-May-1927   DATE OF ADMISSION:  10/13/2003  DATE OF DISCHARGE:  10/22/2003                                 DISCHARGE SUMMARY   DISCHARGE DIAGNOSES:  1. Atrial fibrillation with rapid ventricular response.  2. Congestive heart failure with low ejection fraction.  3. Malnutrition.  4. Depression disorder.  5. Hyponatremia.   DISCHARGE MEDICATIONS:  1. Kay Ciel 40 mEq p.o. q.d.  2. Remeron 50 mg p.o. q.h.s.  3. Protonix 40 mg p.o. q.d.  4. Pilocarpine 1% eye drop to the right eye.  5. Digoxin 0.125 mg daily.  6. Lasix 40 mg p.o. q.d.  7. Toprol XL 50 mg p.o. q.d.  8. MiraLax 70 mg p.o. q.d.  9. Coumadin 5 mg p.o. q.d.  10.      Lexapro 10 mg p.o. q.d.  11.      Lisinopril 30 mg p.o. q.d.  12.      Cardizem 100 mg p.o. q.d.  13.      Tylenol 500 mg p.o. q.6h. p.r.n.  14.      Lanoxin 0.25 mg one tablet p.o. t.i.d.  15.      Senokot S one tablet p.o. q.d. p.r.n. for constipation.   DISPOSITION:  The patient will be transferred to the nursing home.   HOSPITAL COURSE:  This is a 77 year old female patient who was admitted to  [facility] on October 13, 2003, due to generalized weakness.  On  admission, the patient was found to have atrial fibrillation with rapid  ventricular response.  She had also bilateral pleural effusion and leg  edema.  Further evaluation revealed that the patient had congestive heart  failure with very low ejection fraction.  The patient also had hyponatremia  which was later corrected.  A cardiology consult was done and participated  in the patient's treatment.  Echocardiogram showed ejection fraction of 20-25%.  The patient was later  started on anticoagulation.  Her  ventricular rate was controlled.  The  patient is currently fully anticoagulated.  She will be transferred to the  nursing home.  The patient's PT/INR to be repeated on October 25, 2003.     ___________________________________________                                         Eustaquio Maize. Felecia Shelling, M.D.   TDF/MEDQ  D:  10/22/2003  T:  10/22/2003  Job:  [id number]

## 2011-04-28 ENCOUNTER — Other Ambulatory Visit (HOSPITAL_COMMUNITY): Payer: Self-pay | Admitting: Urology

## 2011-04-28 DIAGNOSIS — R3129 Other microscopic hematuria: Secondary | ICD-10-CM

## 2011-05-03 ENCOUNTER — Encounter (HOSPITAL_COMMUNITY): Payer: Self-pay

## 2011-05-03 ENCOUNTER — Ambulatory Visit (HOSPITAL_COMMUNITY)
Admission: RE | Admit: 2011-05-03 | Discharge: 2011-05-03 | Disposition: A | Discharge status: Home / Self Care | Payer: Medicare HMO | Source: Ambulatory Visit | Attending: Urology | Admitting: Urology

## 2011-05-03 DIAGNOSIS — K869 Disease of pancreas, unspecified: Secondary | ICD-10-CM | POA: Insufficient documentation

## 2011-05-03 DIAGNOSIS — R3911 Hesitancy of micturition: Secondary | ICD-10-CM | POA: Insufficient documentation

## 2011-05-03 DIAGNOSIS — R3129 Other microscopic hematuria: Secondary | ICD-10-CM | POA: Insufficient documentation

## 2011-05-03 DIAGNOSIS — Q619 Cystic kidney disease, unspecified: Secondary | ICD-10-CM | POA: Insufficient documentation

## 2011-05-03 DIAGNOSIS — N200 Calculus of kidney: Secondary | ICD-10-CM | POA: Insufficient documentation

## 2011-05-03 MED ORDER — IOHEXOL 300 MG/ML  SOLN
125.0000 mL | Freq: Once | INTRAMUSCULAR | Status: AC | PRN
  Administered 2011-05-03: 125 mL via INTRAVENOUS

## 2011-08-04 ENCOUNTER — Emergency Department (HOSPITAL_COMMUNITY): Payer: Medicare HMO

## 2011-08-04 ENCOUNTER — Other Ambulatory Visit: Payer: Self-pay

## 2011-08-04 ENCOUNTER — Inpatient Hospital Stay (HOSPITAL_COMMUNITY)
Admission: EM | Admit: 2011-08-04 | Discharge: 2011-08-11 | DRG: 690 | Disposition: A | Discharge status: Home Health | Payer: Medicare HMO | Attending: Internal Medicine | Admitting: Internal Medicine

## 2011-08-04 ENCOUNTER — Encounter (HOSPITAL_COMMUNITY): Payer: Self-pay

## 2011-08-04 DIAGNOSIS — N390 Urinary tract infection, site not specified: Principal | ICD-10-CM

## 2011-08-04 DIAGNOSIS — I4891 Unspecified atrial fibrillation: Secondary | ICD-10-CM | POA: Diagnosis present

## 2011-08-04 DIAGNOSIS — B9689 Other specified bacterial agents as the cause of diseases classified elsewhere: Secondary | ICD-10-CM | POA: Diagnosis present

## 2011-08-04 DIAGNOSIS — I509 Heart failure, unspecified: Secondary | ICD-10-CM | POA: Diagnosis present

## 2011-08-04 DIAGNOSIS — F0390 Unspecified dementia without behavioral disturbance: Secondary | ICD-10-CM | POA: Diagnosis present

## 2011-08-04 DIAGNOSIS — Z950 Presence of cardiac pacemaker: Secondary | ICD-10-CM

## 2011-08-04 DIAGNOSIS — Z9181 History of falling: Secondary | ICD-10-CM

## 2011-08-04 DIAGNOSIS — Z7901 Long term (current) use of anticoagulants: Secondary | ICD-10-CM

## 2011-08-04 DIAGNOSIS — F329 Major depressive disorder, single episode, unspecified: Secondary | ICD-10-CM | POA: Diagnosis present

## 2011-08-04 DIAGNOSIS — F3289 Other specified depressive episodes: Secondary | ICD-10-CM | POA: Diagnosis present

## 2011-08-04 HISTORY — DX: Unspecified dementia, unspecified severity, without behavioral disturbance, psychotic disturbance, mood disturbance, and anxiety: F03.90

## 2011-08-04 HISTORY — DX: Essential (primary) hypertension: I10

## 2011-08-04 HISTORY — DX: Presence of cardiac pacemaker: Z95.0

## 2011-08-04 LAB — COMPREHENSIVE METABOLIC PANEL
ALT: 7 U/L (ref 0–35)
Albumin: 3.2 g/dL — ABNORMAL LOW (ref 3.5–5.2)
Alkaline Phosphatase: 92 U/L (ref 39–117)
BUN: 16 mg/dL (ref 6–23)
Potassium: 4.3 mEq/L (ref 3.5–5.1)
Total Bilirubin: 0.4 mg/dL (ref 0.3–1.2)
Total Protein: 7.2 g/dL (ref 6.0–8.3)

## 2011-08-04 LAB — URINE MICROSCOPIC-ADD ON

## 2011-08-04 LAB — URINALYSIS, ROUTINE W REFLEX MICROSCOPIC
Bilirubin Urine: NEGATIVE
Glucose, UA: NEGATIVE mg/dL
Nitrite: POSITIVE — AB
Protein, ur: NEGATIVE mg/dL
Specific Gravity, Urine: 1.01 (ref 1.005–1.030)
Urobilinogen, UA: 0.2 mg/dL (ref 0.0–1.0)
pH: 6.5 (ref 5.0–8.0)

## 2011-08-04 LAB — PROTIME-INR
INR: 1.71 — ABNORMAL HIGH (ref 0.00–1.49)
Prothrombin Time: 20.4 seconds — ABNORMAL HIGH (ref 11.6–15.2)

## 2011-08-04 LAB — CBC
MCV: 90.1 fL (ref 78.0–100.0)
Platelets: 243 10*3/uL (ref 150–400)
RDW: 15.5 % (ref 11.5–15.5)
WBC: 7.3 10*3/uL (ref 4.0–10.5)

## 2011-08-04 MED ORDER — DILTIAZEM HCL ER COATED BEADS 180 MG PO CP24
180.0000 mg | ORAL_CAPSULE | Freq: Every day | ORAL | Status: DC
  Administered 2011-08-05 – 2011-08-11 (×7): 180 mg via ORAL
  Filled 2011-08-04 (×7): qty 1

## 2011-08-04 MED ORDER — SODIUM CHLORIDE 0.9 % IV SOLN
INTRAVENOUS | Status: DC
  Administered 2011-08-04: 75 mL via INTRAVENOUS
  Administered 2011-08-05: 06:00:00 via INTRAVENOUS

## 2011-08-04 MED ORDER — SIMVASTATIN 10 MG PO TABS
10.0000 mg | ORAL_TABLET | Freq: Every day | ORAL | Status: DC
  Administered 2011-08-04 – 2011-08-10 (×8): 10 mg via ORAL
  Filled 2011-08-04 (×7): qty 1

## 2011-08-04 MED ORDER — DEXTROSE 5 % IV SOLN
1.0000 g | INTRAVENOUS | Status: DC
  Administered 2011-08-05 – 2011-08-10 (×6): 1 g via INTRAVENOUS
  Filled 2011-08-04 (×7): qty 1

## 2011-08-04 MED ORDER — DEXTROSE 5 % IV SOLN
1.0000 g | Freq: Once | INTRAVENOUS | Status: AC
  Administered 2011-08-04: 1 g via INTRAVENOUS
  Filled 2011-08-04: qty 1

## 2011-08-04 MED ORDER — ENOXAPARIN SODIUM 30 MG/0.3ML ~~LOC~~ SOLN
30.0000 mg | SUBCUTANEOUS | Status: DC
  Administered 2011-08-04: 30 mg via SUBCUTANEOUS
  Filled 2011-08-04: qty 0.3

## 2011-08-04 MED ORDER — DILTIAZEM HCL 60 MG PO TABS: 180.0000 mg | ORAL_TABLET | Freq: Every day | ORAL | Status: DC

## 2011-08-04 MED ORDER — TRAVOPROST (BAK FREE) 0.004 % OP SOLN
1.0000 [drp] | Freq: Every day | OPHTHALMIC | Status: DC
  Administered 2011-08-04 – 2011-08-10 (×7): 1 [drp] via OPHTHALMIC
  Filled 2011-08-04: qty 2.5

## 2011-08-04 MED ORDER — METOPROLOL SUCCINATE ER 25 MG PO TB24
25.0000 mg | ORAL_TABLET | Freq: Every day | ORAL | Status: DC
  Administered 2011-08-05 – 2011-08-11 (×7): 25 mg via ORAL
  Filled 2011-08-04 (×7): qty 1

## 2011-08-04 MED ORDER — THERA M PLUS PO TABS
1.0000 | ORAL_TABLET | Freq: Every day | ORAL | Status: DC
  Administered 2011-08-05 – 2011-08-11 (×7): 1 via ORAL
  Filled 2011-08-04 (×7): qty 1

## 2011-08-04 MED ORDER — MIRTAZAPINE 30 MG PO TABS
15.0000 mg | ORAL_TABLET | Freq: Every day | ORAL | Status: DC
  Administered 2011-08-04 – 2011-08-10 (×7): 15 mg via ORAL
  Filled 2011-08-04 (×7): qty 1

## 2011-08-04 MED ORDER — SODIUM CHLORIDE 0.9 % IV SOLN
Freq: Once | INTRAVENOUS | Status: AC
  Administered 2011-08-04: 16:00:00 via INTRAVENOUS

## 2011-08-04 MED ORDER — PNEUMOCOCCAL VAC POLYVALENT 25 MCG/0.5ML IJ INJ
0.5000 mL | INJECTION | INTRAMUSCULAR | Status: AC
  Filled 2011-08-04: qty 0.5

## 2011-08-04 MED ORDER — PANTOPRAZOLE SODIUM 40 MG PO TBEC
40.0000 mg | DELAYED_RELEASE_TABLET | Freq: Every day | ORAL | Status: DC
  Administered 2011-08-05 – 2011-08-10 (×8): 40 mg via ORAL
  Filled 2011-08-04 (×6): qty 1

## 2011-08-04 MED ORDER — WARFARIN SODIUM 2 MG PO TABS
3.0000 mg | ORAL_TABLET | Freq: Every day | ORAL | Status: DC
  Administered 2011-08-04 – 2011-08-07 (×5): 3 mg via ORAL
  Filled 2011-08-04 (×4): qty 1

## 2011-08-04 MED ORDER — DARIFENACIN HYDROBROMIDE ER 7.5 MG PO TB24
7.5000 mg | ORAL_TABLET | Freq: Every day | ORAL | Status: DC
  Administered 2011-08-05 – 2011-08-11 (×7): 7.5 mg via ORAL
  Filled 2011-08-04 (×10): qty 1

## 2011-08-04 NOTE — ED Notes (Signed)
Pharmacy tech spoke with pharmacist and he stated that [facility] would need to be called to complete proper paper work. Daughter is not currently at bedside. Will call [facility] when daughter arrives back in room.

## 2011-08-04 NOTE — ED Notes (Signed)
Spoke with Dr. Preston Fleeting about removing pt from backboard per family request .

## 2011-08-04 NOTE — ED Notes (Signed)
Pt back from x-ray. NAD.

## 2011-08-04 NOTE — ED Notes (Signed)
Received in signout to admit for frequent falls/uti Pt stable, no new complaints D/w dr Renard Matter, will admit for dr Felecia Shelling  Joya Gaskins, MD 08/04/11 1729

## 2011-08-04 NOTE — ED Notes (Signed)
Attempted report. Shanda Bumps, RN to call back.

## 2011-08-04 NOTE — ED Notes (Signed)
Daughter states she asked previous EMD if she could give her mother her daily medications and did not receive a clear answer, per daughter. Daughter asked again about daily meds. I spoke with Dr. Bebe Shaggy and he stated it was fine if it is fine with pharmacy. Spoke with pharmacy tech and she will speak with pharmacist re: home meds and get back with me after speaking with him. Pt resting quietly at this time.

## 2011-08-04 NOTE — ED Provider Notes (Cosign Needed)
History   Chart scribed for Ward Givens, MD by Enos Fling; the patient was seen in room APA07/APA07; this patient's care was started at 12:59 PM.    CSN: [id number] Arrival date & time: 08/04/2011 12:13 PM  No chief complaint on file.  The history is provided by the patient and a relative. History Limited By: pt is poor historian and answers very slowly if at all.   - Level 5 caveat for dementia [name] is a 77 y.o. female brought in by ambulance, who presents to the Emergency Department s/p fall. Per EMS report, pt fell at home from a standing position while in a dark room. Pt does not remember falling or what happened. LOC unknown. Time of fall unknown. Pt found on floor by neighbor just pta. Pt c/o pain "everywhere", specifically her head and left knee. Family states pt has chronic knee pain and needs  knee replacement. Family also states pt has been "different" for the past 6  weeks since procedure with dye for evaluation of bladder and a fall shortly after when she was found out in the yard by a neighbor, which was about a month ago.  Pt states she has felt weak lately, but is eating normally. Denies nausea, vomiting, chest pain, sob, or fever. Family states pt has not taken any of her meds today. She is on coumadin for pacemaker. Family does not know if pt has h/o stents.  PCP Dr. Felecia Shelling Cardiologist Dr Alanda Amass  Past Medical History  Diagnosis Date  . Dementia   . Pacemaker   . Hypertension    Sees Dr Alanda Amass, [facility] for her pacemaker.  History reviewed. No pertinent past surgical history. Pacemaker  History reviewed. No pertinent family history.  History  Substance Use Topics  . Smoking status: Never Smoker   . Smokeless tobacco: Not on file  . Alcohol Use: No  Lives alone, neighbor and daughter take her food.  OB History    Grav Para Term Preterm Abortions TAB SAB Ect Mult Living                 Patient's Medications  New Prescriptions   No medications on  file  Previous Medications   DILTIAZEM (CARDIZEM CD) 180 MG 24 HR CAPSULE    Take 180 mg by mouth daily.     LOVASTATIN (MEVACOR) 10 MG TABLET    Take 10 mg by mouth at bedtime.     METOPROLOL TARTRATE (LOPRESSOR) 25 MG TABLET    Take 25 mg by mouth daily.     MIRTAZAPINE (REMERON) 15 MG TABLET    Take 15 mg by mouth at bedtime.     MULTIPLE VITAMINS-MINERALS (CENTRUM SILVER PO)    Take 1 tablet by mouth daily.     OMEPRAZOLE (PRILOSEC) 20 MG CAPSULE    Take 20 mg by mouth daily.     SOLIFENACIN (VESICARE) 5 MG TABLET    Take 10 mg by mouth daily.     TRAVOPROST, BAK FREE, (TRAVATAMN) 0.004 % SOLN OPHTHALMIC SOLUTION    Place 1 drop into the right eye at bedtime.     WARFARIN (COUMADIN) 3 MG TABLET    Take 3 mg by mouth daily. Patient usually takes one tablet daily but she was told to take 2 tablets yesterday and today per DR.  Modified Medications   No medications on file  Discontinued Medications   No medications on file     Allergies as of 08/04/2011  . (  No Known Allergies)      Review of Systems  All other systems reviewed and are negative.   Level 5 caveat for dementia  Physical Exam  BP 143/57  Pulse 74  Temp(Src) 97.9 F (36.6 C) (Oral)  Resp 20  SpO2 97%  Physical Exam  Constitutional:       Frail immobilized on a back board with C collar  HENT:  Head: Normocephalic and atraumatic.  Eyes: Conjunctivae and EOM are normal. Pupils are equal, round, and reactive to light.  Neck:       Pt in c collar  Cardiovascular: Normal rate, regular rhythm and normal heart sounds.   Pulmonary/Chest: Effort normal and breath sounds normal.  Abdominal: Soft. Bowel sounds are normal.  Musculoskeletal: She exhibits no edema.       Good ROM of all extremities, c/o pain in knees which daughter states is an old complaint. No abrasions to knees.   Neurological: She is alert.       Slow to respond, daughter denies hearing problems and states she has been this way since her IV dye test  6 weeks ago.  Skin: Skin is warm and dry.  Psychiatric:       Very flat affect     Procedures - none  OTHER DATA REVIEWED: Nursing notes and vital signs reviewed. Prior records reviewed.  DIAGNOSTIC STUDIES: Oxygen Saturation is 97% on room air, normal by my Interpretation.  EKG:   Date: 08/04/2011  Rate: 101  Rhythm: atrial fibrillation  QRS Axis: normal  Intervals: normal  ST/T Wave abnormalities: nonspecific T wave changes and ST depressions inferiorly  Conduction Disutrbances:none  Narrative Interpretation:   Old EKG Reviewed: changes noted from 09/20/2006 when she was in NSR with some paced beats.    LABS / RADIOLOGY: Results for orders placed during the hospital encounter of 08/04/11  CBC      Component Value Range   WBC 7.3  4.0 - 10.5 (K/uL)   RBC 4.06  3.87 - 5.11 (MIL/uL)   Hemoglobin 11.9 (*) 12.0 - 15.0 (g/dL)   HCT 40.9  81.1 - 91.4 (%)   MCV 90.1  78.0 - 100.0 (fL)   MCH 29.3  26.0 - 34.0 (pg)   MCHC 32.5  30.0 - 36.0 (g/dL)   RDW 78.2  95.6 - 21.3 (%)   Platelets 243  150 - 400 (K/uL)  COMPREHENSIVE METABOLIC PANEL      Component Value Range   Sodium 138  135 - 145 (mEq/L)   Potassium 4.3  3.5 - 5.1 (mEq/L)   Chloride 101  96 - 112 (mEq/L)   CO2 25  19 - 32 (mEq/L)   Glucose, Bld 102 (*) 70 - 99 (mg/dL)   BUN 16  6 - 23 (mg/dL)   Creatinine, Ser 0.86  0.50 - 1.10 (mg/dL)   Calcium 9.2  8.4 - 57.8 (mg/dL)   Total Protein 7.2  6.0 - 8.3 (g/dL)   Albumin 3.2 (*) 3.5 - 5.2 (g/dL)   AST 15  0 - 37 (U/L)   ALT 7  0 - 35 (U/L)   Alkaline Phosphatase 92  39 - 117 (U/L)   Total Bilirubin 0.4  0.3 - 1.2 (mg/dL)   GFR calc non Af Amer >60  >60 (mL/min)   GFR calc Af Amer >60  >60 (mL/min)  APTT      Component Value Range   aPTT 37  24 - 37 (seconds)  PROTIME-INR  Component Value Range   Prothrombin Time 20.4 (*) 11.6 - 15.2 (seconds)   INR 1.71 (*) 0.00 - 1.49   URINALYSIS, ROUTINE W REFLEX MICROSCOPIC      Component Value Range   Color,  Urine YELLOW  YELLOW    Appearance CLEAR  CLEAR    Specific Gravity, Urine 1.010  1.005 - 1.030    pH 6.5  5.0 - 8.0    Glucose, UA NEGATIVE  NEGATIVE (mg/dL)   Hgb urine dipstick MODERATE (*) NEGATIVE    Bilirubin Urine NEGATIVE  NEGATIVE    Ketones, ur NEGATIVE  NEGATIVE (mg/dL)   Protein, ur NEGATIVE  NEGATIVE (mg/dL)   Urobilinogen, UA 0.2  0.0 - 1.0 (mg/dL)   Nitrite POSITIVE (*) NEGATIVE    Leukocytes, UA MODERATE (*) NEGATIVE   CK      Component Value Range   Total CK 47  7 - 177 (U/L)  URINE MICROSCOPIC-ADD ON      Component Value Range   Squamous Epithelial / LPF FEW (*) RARE    WBC, UA 11-20  <3 (WBC/hpf)   RBC / HPF 3-6  <3 (RBC/hpf)   Bacteria, UA MANY (*) RARE    Labs normal except for UTI  Dg Pelvis 1-2 Views  08/04/2011  *RADIOLOGY REPORT*  Clinical Data: Pain secondary to a fall.  PELVIS - 1-2 VIEW  Comparison: CT scan dated 05/03/2011  Findings: No fracture or dislocation or other acute abnormality. Extensive calcification in the abdominal aorta.  IMPRESSION: No acute abnormalities.  Original Report Authenticated By: Gwynn Burly, M.D.   Ct Head Wo Contrast  08/04/2011  *RADIOLOGY REPORT*  Clinical Data:  Larey Seat.  Hit head.  CT HEAD WITHOUT CONTRAST CT CERVICAL SPINE WITHOUT CONTRAST  Technique:  Multidetector CT imaging of the head and cervical spine was performed following the standard protocol without intravenous contrast.  Multiplanar CT image reconstructions of the cervical spine were also generated.  Comparison:  Head CT 06/14/2005.  CT HEAD  Findings: Age related cerebral atrophy, ventriculomegaly and periventricular white matter disease.  No extra-axial fluid collections.  No CT findings for acute hemispheric infarction or intracranial hemorrhage.  No mass lesions.  The brainstem and cerebellum are grossly normal.  The calvarium is intact.  Remote post-traumatic or surgical changes involving the left maxillary sinus.  The mastoid air cells are clear.  IMPRESSION:   1.  Age related cerebral atrophy, ventriculomegaly and periventricular white matter disease. 2.  No acute intracranial findings or skull fracture.  CT CERVICAL SPINE  Findings: The sagittal reformatted images demonstrate mild degenerative cervical spondylosis with disc disease and facet disease.  Moderate osteoporosis.  No acute fracture or abnormal prevertebral soft tissue swelling.  The C1-2 articulations are maintained.  The dens is intact. Carotid artery calcifications are noted.  The lung apices are clear.  IMPRESSION: Degenerative cervical spondylosis and osteoporosis but no acute findings.  Original Report Authenticated By: P. Loralie Champagne, M.D.   Ct Cervical Spine Wo Contrast  08/04/2011  *RADIOLOGY REPORT*  Clinical Data:  Larey Seat.  Hit head.  CT HEAD WITHOUT CONTRAST CT CERVICAL SPINE WITHOUT CONTRAST  Technique:  Multidetector CT imaging of the head and cervical spine was performed following the standard protocol without intravenous contrast.  Multiplanar CT image reconstructions of the cervical spine were also generated.  Comparison:  Head CT 06/14/2005.  CT HEAD  Findings: Age related cerebral atrophy, ventriculomegaly and periventricular white matter disease.  No extra-axial fluid collections.  No CT findings for  acute hemispheric infarction or intracranial hemorrhage.  No mass lesions.  The brainstem and cerebellum are grossly normal.  The calvarium is intact.  Remote post-traumatic or surgical changes involving the left maxillary sinus.  The mastoid air cells are clear.  IMPRESSION:  1.  Age related cerebral atrophy, ventriculomegaly and periventricular white matter disease. 2.  No acute intracranial findings or skull fracture.  CT CERVICAL SPINE  Findings: The sagittal reformatted images demonstrate mild degenerative cervical spondylosis with disc disease and facet disease.  Moderate osteoporosis.  No acute fracture or abnormal prevertebral soft tissue swelling.  The C1-2 articulations are  maintained.  The dens is intact. Carotid artery calcifications are noted.  The lung apices are clear.  IMPRESSION: Degenerative cervical spondylosis and osteoporosis but no acute findings.  Original Report Authenticated By: P. Loralie Champagne, M.D.    ED COURSE: 3:42 PM - Results discussed with family, they agree with admission for abx for UTI.   MDM:   IMPRESSION: Fall Urinary tract infection Weakness    MEDS GIVEN IN ED: IV abx Rocephin started  I personally performed the services described in this documentation, which was scribed in my presence. The recorded information has been reviewed and considered. Devoria Albe, MD, Armando Gang    Ward Givens, MD 08/04/11 [phone]

## 2011-08-04 NOTE — ED Notes (Signed)
Received admission orders from Dr. Renard Matter at [phone]. Awaiting bed assignment.

## 2011-08-04 NOTE — ED Notes (Signed)
URC added on. Antibiotics initiated. Family at bedside. NAD.

## 2011-08-04 NOTE — ED Notes (Signed)
Per EMS, pt fell from a a standing position. Complain of pain in low back, left shoulder and left knee. Pt states it was dark in her room

## 2011-08-04 NOTE — ED Notes (Signed)
Report given to Shanda Bumps, Charity fundraiser. Pt to floor.

## 2011-08-04 NOTE — ED Notes (Signed)
Pt requesting C-Collar removed. C-spine results are back. Dr. Lynelle Doctor is aware. NAD Family at bedside.

## 2011-08-05 LAB — PROTIME-INR: INR: 1.93 — ABNORMAL HIGH (ref 0.00–1.49)

## 2011-08-05 NOTE — Progress Notes (Signed)
NAME:  ,                                 ACCOUNT NO.:  MEDICAL RECORD NO.:  [id number]  LOCATION:                                 FACILITY:  PHYSICIAN:  Hyacinth Marcelli D. Felecia Shelling, MD        DATE OF BIRTH:  DATE OF PROCEDURE: DATE OF DISCHARGE:                                PROGRESS NOTE   The patient was admitted last night due to fall and weakness.  She was found to have urinary tract infection.  The patient is currently on IV antibiotics.  SUBJECTIVE:  The patient feels slightly better.  No fever or chills.  OBJECTIVE:  GENERAL:  The patient is alert, awake, and chronically sick looking. VITAL SIGNS:  Blood pressure 118/70, pulse 73, respiratory rate 16, temperature 97.5 degrees Fahrenheit. CHEST:  Decreased air entry, few rhonchi. CARDIOVASCULAR SYSTEM:  First and second heart sound heard.  No murmur. No gallop. ABDOMEN:  Soft and lax.  Bowel sounds positive.  No masses or organomegaly. EXTREMITIES:  No leg edema.  ASSESSMENT: 1. Urinary tract infection. 2. Change in mental status and history of falls, again probably     secondary to the above. 3. Atrial fibrillation. 4. Congestive heart failure. 5. Hypertension.  PLAN:  We will continue the patient on IV antibiotics.  We will continue anticoagulation.  We will continue to monitor her PT/INR.     Lige Lakeman D. Felecia Shelling, MD     TDF/MEDQ  D:  08/05/2011  T:  08/05/2011  Job:  [id number]

## 2011-08-05 NOTE — Progress Notes (Addendum)
ANTICOAGULATION CONSULT NOTE - Initial Consult  Pharmacy Consult for  Warfarin Indication: atrial fibrillation (continuation of home dose).  No Known Allergies  Patient Measurements: Height: 5\' 4"  (162.6 cm) Weight: 132 lb 6.4 oz (60.056 kg) IBW/kg (Calculated) : 54.7  Adjusted Body Weight: n/a  Vital Signs: Temp: 97.5 F (36.4 C) (09/07 0459) Temp src: Oral (09/07 0459) BP: 138/56 mmHg (09/07 0459) Pulse Rate: 107  (09/07 0459)  Labs:  Basename 08/05/11 0607 08/04/11 1315  HGB -- 11.9*  HCT -- 36.6  PLT -- 243  APTT -- 37  LABPROT 22.4* 20.4*  INR 1.93* 1.71*  HEPARINUNFRC -- --  CREATININE -- 0.84  CRCLEARANCE -- --  CKTOTAL -- 47  CKMB -- --  TROPONINI -- --    Medical History: Past Medical History  Diagnosis Date  . Dementia   . Pacemaker   . Hypertension     Medications:   Home dose is 3mg  daily, but took 2mg  on 08/04/11.  Assessment:  Subtherapeutic INR  Goal of Therapy:  INR 2-3   Plan:   Warfarin 3mg  daily. Daily INR for now  Caryl Asp 08/05/2011,8:37 AM

## 2011-08-05 NOTE — H&P (Signed)
[name], ORD                ACCOUNT NO.:  [id number]  MEDICAL RECORD NO.:  [id number]  LOCATION:                                 FACILITY:  PHYSICIAN:  Brittnye Josephs G. Renard Matter, MD   DATE OF BIRTH:  March 02, 1927  DATE OF ADMISSION: DATE OF DISCHARGE:  LH                             HISTORY & PHYSICAL   An 77 year old white female who presented to the ED after having been brought in by ambulance.  Apparently, the patient had fallen while in dark room.  Apparently, the patient had generalized pain.  She apparently has a history of atrial fibrillation, is on Coumadin.  She was found down in the yard by neighbor about a month prior to this.  The patient does have a pacemaker, is on Coumadin for this reason.  Her cardiologist is Dr. Susa Griffins.  She was evaluated by ED physician.  CT of the head was performed, which showed degenerative cervical spondylosis and osteoporosis with no acute findings.  This was also seen on CT of cervical spine.  She did have evidence of cerebral atrophy, periventricular white matter disease.  No evidence of infarction.  No hemorrhage.  No mass lesions.  Brain stem cerebellum grossly normal.  The patient did have evidence of urinary tract infection with 11-20 wbc's and bacteria.  CBC, WBC 7300 with hemoglobin 11.9, hematocrit 36.6.  Chemistries, sodium 138, potassium 4.3, chloride 101, CO2 of 25, glucose 102, BUN 16, creatinine 0.84, calcium 9.2, total protein 7.2, albumin 3.2, AST 15, ALT 7.  Electrocardiogram revealed evidence of atrial fibrillation, nonspecific T-wave and ST depressions inferiorly.  The patient was subsequently admitted to be having been started on IV Rocephin.  SOCIAL HISTORY:  The patient does not smoke or use alcohol.  FAMILY HISTORY:  Not available.  PAST MEDICAL AND SURGICAL HISTORY:  The patient has a history of dementia, pacemaker implantation, atrial fibrillation, and hypertension. No pertinent surgical history except for  a pacemaker implantation.  REVIEW OF SYSTEMS:  HEENT:  Negative.  CARDIOPULMONARY:  No dyspnea.  No cough.  GI:  No bowel irregularity or bleeding.  GU:  No dysuria, hematuria.  MEDICATION LIST: 1. Diltiazem 180 mg daily. 2. Lovastatin 10 mg daily. 3. Metoprolol 25 mg daily. 4. Remeron 15 mg at bedtime. 5. Multivitamins 1 tablet daily. 6. Prilosec 20 mg daily. 7. Vesicare 10 mg daily. 8. Travoprost 0.004% 1 drop into the right eye at bedtime. 9. Warfarin 3 mg daily.  PHYSICAL EXAMINATION:  VITAL SIGNS:  Alert female with blood pressure 143/57, pulse 74, temperature 97.9. HEENT:  Eyes, PERRLA.  TMs negative.  Oropharynx benign. NECK:  Supple.  No JVD or thyroid abnormalities. HEART:  Irregular rhythm.  No murmurs. LUNGS:  Clear to P and A. ABDOMEN:  No palpable organs or masses. EXTREMITIES:  No abrasions. SKIN:  Warm and dry. NEUROLOGIC:  No focal deficit.  No motor or sensory disturbance.  ASSESSMENT:  The patient was admitted with urinary tract infection, impaired sensorium, history of pacemaker implantation, atrial fibrillation, hypertension.  PLAN:  To continue to monitor the patient.  We will continue her IV Rocephin.  We will obtain UA for culture and sensitivity.  [name] G. Renard Matter, MD     AGM/MEDQ  D:  08/04/2011  T:  08/04/2011  Job:  [id number]

## 2011-08-05 NOTE — Progress Notes (Signed)
UR Chart Review Completed  

## 2011-08-06 LAB — URINE CULTURE
Colony Count: >=100000
Culture  Setup Time: 201209061840

## 2011-08-06 NOTE — Progress Notes (Signed)
[name], [name]                ACCOUNT NO.:  [id number]  MEDICAL RECORD NO.:  [id number]  LOCATION:  A332                          FACILITY:  [facility]  PHYSICIAN:  Dinero Chavira D. Felecia Shelling, MD   DATE OF BIRTH:  10-29-1927  DATE OF PROCEDURE:  08/06/2011 DATE OF DISCHARGE:                                PROGRESS NOTE   SUBJECTIVE:  The patient feels slightly better.  No fever or chills.  OBJECTIVE:  GENERAL:  The patient is alert, awake, and deconditioned. VITAL SIGNS:  Blood pressure 170/64, pulse 72, respiratory rate 16, and temperature 98 degrees Fahrenheit. CHEST:  Clear lung fields, good air entry. CARDIOVASCULAR SYSTEM:  First and second heart sound heard.  No murmur. No gallop. ABDOMEN:  Soft and lax.  Bowel sound is positive.  No mass or organomegaly. EXTREMITIES:  No leg edema.  ASSESSMENT: 1. Urinary tract infection. 2. Change in mental status secondary to the above. 3. Atrial fibrillation. 4. History of congestive heart failure.  PLAN:  We will continue the patient on IV antibiotics.  We will do Physical Therapy consult.  We will continue anticoagulation.  We will continue her current treatment and supportive care.     Jehan Bonano D. Felecia Shelling, MD     TDF/MEDQ  D:  08/06/2011  T:  08/06/2011  Job:  [id number]

## 2011-08-06 NOTE — Progress Notes (Signed)
ANTICOAGULATION CONSULT NOTE - Initial Consult  Pharmacy Consult for  Warfarin Indication: atrial fibrillation (continuation of home dose).  No Known Allergies  Patient Measurements: Height: 5\' 4"  (162.6 cm) Weight: 132 lb 6.4 oz (60.056 kg) IBW/kg (Calculated) : 54.7  Adjusted Body Weight: n/a  Vital Signs: Temp: 98 F (36.7 C) (09/08 0614) Temp src: Oral (09/08 0614) BP: 132/63 mmHg (09/08 0614) Pulse Rate: 74  (09/08 0614)  Labs:  Basename 08/06/11 0600 08/05/11 0607 08/04/11 1315  HGB -- -- 11.9*  HCT -- -- 36.6  PLT -- -- 243  APTT -- -- 37  LABPROT 22.6* 22.4* 20.4*  INR 1.95* 1.93* 1.71*  HEPARINUNFRC -- -- --  CREATININE -- -- 0.84  CRCLEARANCE -- -- --  CKTOTAL -- -- 47  CKMB -- -- --  TROPONINI -- -- --    Medical History: Past Medical History  Diagnosis Date  . Dementia   . Pacemaker   . Hypertension     Medications:   Home dose is 3mg  daily, but took 2mg  on 08/04/11.  Assessment:  Subtherapeutic INR, trending upward  Goal of Therapy:  INR 2-3   Plan:   Warfarin 3mg  daily. Daily INR for now  Josephine Igo 08/06/2011,8:29 AM

## 2011-08-07 LAB — PROTIME-INR
INR: 2.11 — ABNORMAL HIGH (ref 0.00–1.49)
Prothrombin Time: 24 seconds — ABNORMAL HIGH (ref 11.6–15.2)

## 2011-08-07 NOTE — Progress Notes (Signed)
ANTICOAGULATION CONSULT NOTE - Initial Consult  Pharmacy Consult for  Warfarin Indication: atrial fibrillation (continuation of home dose).  No Known Allergies  Patient Measurements: Height: 5\' 4"  (162.6 cm) Weight: 132 lb 6.4 oz (60.056 kg) IBW/kg (Calculated) : 54.7  Adjusted Body Weight: n/a  Vital Signs: Temp: 97.8 F (36.6 C) (09/09 0600) Temp src: Oral (09/09 0600) BP: 115/61 mmHg (09/09 0600) Pulse Rate: 78  (09/09 0600)  Labs:  Basename 08/07/11 0429 08/06/11 0600 08/05/11 0607 08/04/11 1315  HGB -- -- -- 11.9*  HCT -- -- -- 36.6  PLT -- -- -- 243  APTT -- -- -- 37  LABPROT 24.0* 22.6* 22.4* --  INR 2.11* 1.95* 1.93* --  HEPARINUNFRC -- -- -- --  CREATININE -- -- -- 0.84  CRCLEARANCE -- -- -- --  CKTOTAL -- -- -- 47  CKMB -- -- -- --  TROPONINI -- -- -- --    Medical History: Past Medical History  Diagnosis Date  . Dementia   . Pacemaker   . Hypertension     Medications:   Home dose is 3mg  daily, but took 2mg  on 08/04/11.  Assessment:  Therapeutic INR  Goal of Therapy:  INR 2-3   Plan:   Warfarin 3mg  daily. Daily INR for now  Josephine Igo 08/07/2011,8:27 AM

## 2011-08-07 NOTE — Progress Notes (Signed)
[name], MALLY                ACCOUNT NO.:  [id number]  MEDICAL RECORD NO.:  [id number]  LOCATION:  A332                          FACILITY:  [facility]  PHYSICIAN:  Gelena Klosinski D. Felecia Shelling, MD   DATE OF BIRTH:  Jan 30, 1927  DATE OF PROCEDURE:  08/07/2011 DATE OF DISCHARGE:                                PROGRESS NOTE   SUBJECTIVE:  The patient feels slightly better.  She is still weak. Trying to work with physical therapy.  OBJECTIVE:  GENERAL:  The patient is awake, but chronically sick looking and fragile. VITAL SIGNS:  Blood pressure 105/55, pulse 70, respiratory rate 18, temperature 97.7 degrees Fahrenheit. CHEST:  Clear lung fields.  Good air entry. CARDIOVASCULAR:  First and second heart sound heard.  No murmur.  No gallop. ABDOMEN:  Soft and lax.  Bowel sounds positive.  No mass or organomegaly. EXTREMITIES:  No leg edema.  LABORATORY DATA:  The patient is growing Enterobacter aerogenes.  Her PT is 24.0.  INR 1.10.  ASSESSMENT: 1. Urinary tract infection. 2. Change in mental status secondary to the above. 3. Atrial fibrillation. 4. Anticoagulation. 5. Status post pacemaker insertion. 6. Congestive heart failure.  PLAN:  Continue the patient on anticoagulation.  Continue IV antibiotics.  Continue physical therapy and occupational therapy.     Joandy Burget D. Felecia Shelling, MD     TDF/MEDQ  D:  08/07/2011  T:  08/07/2011  Job:  [id number]

## 2011-08-08 LAB — PROTIME-INR
INR: 2.04 — ABNORMAL HIGH (ref 0.00–1.49)
Prothrombin Time: 23.4 seconds — ABNORMAL HIGH (ref 11.6–15.2)

## 2011-08-08 MED ORDER — WARFARIN SODIUM 2 MG PO TABS
4.0000 mg | ORAL_TABLET | Freq: Every day | ORAL | Status: DC
  Administered 2011-08-08: 4 mg via ORAL
  Filled 2011-08-08: qty 2

## 2011-08-08 NOTE — Progress Notes (Signed)
ANTICOAGULATION CONSULT NOTE   Pharmacy Consult for  Warfarin Indication: atrial fibrillation   No Known Allergies  Patient Measurements: Height: 5\' 4"  (162.6 cm) Weight: 132 lb 6.4 oz (60.056 kg) IBW/kg (Calculated) : 54.7   Vital Signs: Temp: 97.9 F (36.6 C) (09/10 0600) Temp src: Oral (09/10 0600) BP: 146/69 mmHg (09/10 0600) Pulse Rate: 71  (09/10 0600)  Labs:  Basename 08/08/11 0456 08/07/11 0429 08/06/11 0600  HGB -- -- --  HCT -- -- --  PLT -- -- --  APTT -- -- --  LABPROT 23.4* 24.0* 22.6*  INR 2.04* 2.11* 1.95*  HEPARINUNFRC -- -- --  CREATININE -- -- --  CRCLEARANCE -- -- --  CKTOTAL -- -- --  CKMB -- -- --  TROPONINI -- -- --   Medical History: Past Medical History  Diagnosis Date  . Dementia   . Pacemaker   . Hypertension    Medications:   Home dose is 3mg  daily  Assessment:  Therapeutic INR but on low end and trending down  Goal of Therapy:  INR 2-3   Plan: Increase Coumadin to 4mg  daily INR daily until therapeutic and stable.  [name], [name] 08/08/2011,9:55 AM

## 2011-08-08 NOTE — Progress Notes (Signed)
Physical Therapy Evaluation Patient Name: [name] Date: 08/08/2011 Problem List: There is no problem list on file for this patient.  Past Medical History:  Past Medical History  Diagnosis Date  . Dementia   . Pacemaker   . Hypertension    Past Surgical History: History reviewed. No pertinent past surgical history.  Precautions/Restrictions  Precautions Precautions: Fall Required Braces or Orthoses: No Restrictions Weight Bearing Restrictions: No Prior Functioning  Home Living Type of Home: Apartment Lives With: Alone Receives Help From: Family Home Layout: One level Home Access: Level entry Bathroom Shower/Tub: Engineer, manufacturing systems: Standard Bathroom Accessibility: Yes How Accessible: Accessible via walker Home Adaptive Equipment: Walker - rolling Prior Function Level of Independence: Independent with basic ADLs;Independent with homemaking with ambulation;Independent with gait;Independent with transfers Driving: Yes Vocation: Retired Financial risk analyst Arousal/Alertness: Lethargic Overall Cognitive Status: Appears within functional limits for tasks assessed Sensation/Coordination Sensation Light Touch: Appears Intact Proprioception: Appears Intact Extremity Assessment RUE Assessment RUE Assessment: Within Functional Limits LUE Assessment LUE Assessment: Within Functional Limits RLE Assessment RLE Assessment: Within Functional Limits LLE Assessment LLE Assessment: Within Functional Limits Mobility (including Balance) Bed Mobility Bed Mobility: Yes Right Sidelying to Sit: 7: Independent Sitting - Scoot to Edge of Bed: 6: Modified independent (Device/Increase time) Transfers Transfers: Yes Sit to Stand: 6: Modified independent (Device/Increase time) Stand to Sit: 6: Modified independent (Device/Increase time) Stand Pivot Transfers: 5: Supervision Stand Pivot Transfer Details (indicate cue type and reason): very slow, labored-took  several minutes toplan and complete transfer Ambulation/Gait Ambulation/Gait: Yes Ambulation/Gait Assistance: 4: Min assist Ambulation Distance (Feet): 90 Feet Assistive device: Rolling walker Gait Pattern: Trunk flexed;Lateral trunk lean to right Gait velocity: very slow, labored, bilateral knee instability due to DJD Stairs: No Wheelchair Mobility Wheelchair Mobility: No  Posture/Postural Control Posture/Postural Control: Postural limitations Postural Limitations: appears to have a significant kyphoscoliosis which causes obliquity of pelvis and lateral lean of trunk Balance Balance Assessed: Yes Dynamic Sitting Balance Dynamic Sitting - Level of Assistance: 7: Independent Static Standing Balance Static Standing - Level of Assistance: 5: Stand by assistance Exercise     End of Session PT - End of Session Equipment Utilized During Treatment: Gait belt Activity Tolerance: Patient limited by fatigue Patient left: in bed;with call bell in reach Nurse Communication: Mobility status for transfers;Mobility status for ambulation General Behavior During Session: [facility] for tasks performed Cognition: [facility] for tasks performed PT Assessment/Plan/Recommendation PT Assessment Clinical Impression Statement: pt appears to be severely deconditioned and now needs a walker to stabilize gait-recommend SNF as she lives alone, but if this doesn't work out, will need maximal home health services PT Recommendation/Assessment: Patient will need skilled PT in the acute care venue PT Problem List: Decreased strength;Decreased activity tolerance;Decreased mobility;Decreased knowledge of precautions;Decreased knowledge of use of DME Barriers to Discharge: Decreased caregiver support PT Therapy Diagnosis : Difficulty walking;Generalized weakness PT Plan PT Frequency: Min 3X/week PT Treatment/Interventions: DME instruction;Gait training;Functional mobility training;Therapeutic exercise;Patient/family  education PT Recommendation Recommendations for Other Services: OT consult Follow Up Recommendations: Home health PT;Skilled nursing facility Equipment Recommended: Defer to next venue PT Goals  Acute Rehab PT Goals PT Goal Formulation: With patient Time For Goal Achievement: 7 days Pt will Ambulate: 51 - 150 feet;with modified independence Konrad Penta 08/08/2011, 11:41 AM

## 2011-08-08 NOTE — Progress Notes (Signed)
[name], [name]                ACCOUNT NO.:  [id number]  MEDICAL RECORD NO.:  [id number]  LOCATION:  A332                          FACILITY:  [facility]  PHYSICIAN:  Koden Hunzeker D. Felecia Shelling, MD   DATE OF BIRTH:  1927/01/05  DATE OF PROCEDURE:  08/08/2011 DATE OF DISCHARGE:                                PROGRESS NOTE   SUBJECTIVE:  The patient feels weak and tired.  No fever or chills.  OBJECTIVE:  GENERAL:  The patient is awake, but chronically sick looking and fragile. VITAL SIGNS:  Blood pressure 110/72, pulse 69, respiratory rate 19, and temperature 99.7 degrees Fahrenheit. CHEST:  Decreased air entry, few rhonchi. CARDIOVASCULAR:  First and second heart sounds heard.  No murmur.  No gallop. ABDOMEN:  Soft and lax.  Bowel sounds positive.  No mass or organomegaly. EXTREMITIES:  No leg edema.  LABORATORY DATA:  PT 23.4, INR 2.04.  ASSESSMENT: 1. Urinary tract infection. 2. Atrial fibrillation. 3. Severe deconditioning. 4. History of recurrent fall.  PLAN:  We will continue IV antibiotics.  We will do social worker consult for possible placement and we will continue physical therapy.     Chaselyn Nanney D. Felecia Shelling, MD     TDF/MEDQ  D:  08/08/2011  T:  08/08/2011  Job:  [id number]

## 2011-08-09 LAB — PROTIME-INR
INR: 1.95 — ABNORMAL HIGH (ref 0.00–1.49)
Prothrombin Time: 22.6 seconds — ABNORMAL HIGH (ref 11.6–15.2)

## 2011-08-09 MED ORDER — SODIUM CHLORIDE 0.9 % IJ SOLN
INTRAMUSCULAR | Status: AC
  Administered 2011-08-09: 19:00:00
  Filled 2011-08-09: qty 10

## 2011-08-09 MED ORDER — WARFARIN SODIUM 5 MG PO TABS
5.0000 mg | ORAL_TABLET | Freq: Once | ORAL | Status: AC
  Administered 2011-08-09: 5 mg via ORAL
  Filled 2011-08-09: qty 1

## 2011-08-09 NOTE — Progress Notes (Signed)
ANTICOAGULATION CONSULT NOTE   Pharmacy Consult for  Warfarin Indication: atrial fibrillation   No Known Allergies  Patient Measurements: Height: 5\' 4"  (162.6 cm) Weight: 132 lb 6.4 oz (60.056 kg) IBW/kg (Calculated) : 54.7   Vital Signs: Temp: 97.3 F (36.3 C) (09/11 0526) Temp src: Oral (09/11 0212) BP: 131/67 mmHg (09/11 0526) Pulse Rate: 71  (09/11 0526)  Labs:  Basename 08/09/11 0526 08/08/11 0456 08/07/11 0429  HGB -- -- --  HCT -- -- --  PLT -- -- --  APTT -- -- --  LABPROT 22.6* 23.4* 24.0*  INR 1.95* 2.04* 2.11*  HEPARINUNFRC -- -- --  CREATININE -- -- --  CRCLEARANCE -- -- --  CKTOTAL -- -- --  CKMB -- -- --  TROPONINI -- -- --   Medical History: Past Medical History  Diagnosis Date  . Dementia   . Pacemaker   . Hypertension    Medications:   Home dose is 3mg  daily  Assessment:  Therapeutic INR but on low end and trending down  Goal of Therapy:  INR 2-3   Plan: Coumadin 5mg  today INR daily until therapeutic and stable.  Gilman Buttner, [place] J 08/09/2011,10:27 AM

## 2011-08-09 NOTE — Progress Notes (Signed)
[name], SOUTAR                ACCOUNT NO.:  [id number]  MEDICAL RECORD NO.:  [id number]  LOCATION:                                 FACILITY:  PHYSICIAN:  Beatrix Breece D. Felecia Shelling, MD   DATE OF BIRTH:  Feb 02, 1927  DATE OF PROCEDURE:  08/09/2011 DATE OF DISCHARGE:                                PROGRESS NOTE   SUBJECTIVE:  The patient feels better.  She is trying to work with physical therapy.  OBJECTIVE:  GENERAL:  The patient is alert, awake, and resting. VITAL SIGNS:  Blood pressure 140/75, pulse 69, respiratory rate 20, temperature 97.3 degrees Fahrenheit. CHEST:  Clear lung fields, good air entry. CARDIOVASCULAR SYSTEM:  First and second heart sounds heard.  No murmur. No gallop. ABDOMEN:  Soft and lax.  Bowel sounds positive.  No mass or organomegaly. EXTREMITIES:  No leg edema.  LABORATORY DATA:  PT 22.6, INR 1.96.  ASSESSMENT: 1. Urinary tract infection. 2. Change in mental status secondary to the above. 3. Atrial fibrillation on anticoagulation. 4. Severe deconditioning.  PLAN:  Continue the patient on physical therapy, occupational therapy. Continue IV antibiotics.  The patient may need placement for rehab.     [name] D. Felecia Shelling, MD     TDF/MEDQ  D:  08/09/2011  T:  08/09/2011  Job:  [id number]

## 2011-08-10 LAB — BASIC METABOLIC PANEL
BUN: 19 mg/dL (ref 6–23)
Calcium: 9 mg/dL (ref 8.4–10.5)
Chloride: 102 mEq/L (ref 96–112)
Creatinine, Ser: 0.64 mg/dL (ref 0.50–1.10)
GFR calc Af Amer: >60 mL/min (ref >60)
GFR calc non Af Amer: >60 mL/min (ref >60)

## 2011-08-10 LAB — CBC
HCT: 35.2 % — ABNORMAL LOW (ref 36.0–46.0)
MCH: 28.8 pg (ref 26.0–34.0)
MCHC: 31.8 g/dL (ref 30.0–36.0)
MCV: 90.5 fL (ref 78.0–100.0)
Platelets: 233 10*3/uL (ref 150–400)
RDW: 15.7 % — ABNORMAL HIGH (ref 11.5–15.5)
WBC: 6.7 10*3/uL (ref 4.0–10.5)

## 2011-08-10 MED ORDER — BISACODYL 10 MG RE SUPP
10.0000 mg | Freq: Every day | RECTAL | Status: DC | PRN
  Administered 2011-08-10: 10 mg via RECTAL
  Filled 2011-08-10: qty 1

## 2011-08-10 MED ORDER — FLEET ENEMA 7-19 GM/118ML RE ENEM: 1.0000 | ENEMA | Freq: Every day | RECTAL | Status: DC | PRN

## 2011-08-10 MED ORDER — WARFARIN SODIUM 2 MG PO TABS
4.0000 mg | ORAL_TABLET | Freq: Once | ORAL | Status: AC
  Administered 2011-08-10: 4 mg via ORAL
  Filled 2011-08-10: qty 1

## 2011-08-10 NOTE — Progress Notes (Signed)
[name], [name]                ACCOUNT NO.:  [id number]  MEDICAL RECORD NO.:  [id number]  LOCATION:  A332                          FACILITY:  [facility]  PHYSICIAN:  Leasa Kincannon D. Felecia Shelling, MD   DATE OF BIRTH:  May 02, 1927  DATE OF PROCEDURE:  08/10/2011 DATE OF DISCHARGE:                                PROGRESS NOTE   SUBJECTIVE:  The patient is feeling better.  She is trying to work with physical therapy.  The patient remained deconditioned and weak.  OBJECTIVE:  VITAL SIGNS:  Blood pressure 111/67, pulse 69, respiratory rate 18, temperature 98.6 degrees Fahrenheit. CHEST:  Clear lung fields.  Good air entry. CARDIOVASCULAR SYSTEM:  First and second heart sounds heard.  No murmur. No gallop. ABDOMEN:  Soft and lax.  Bowel sound is positive.  No mass or organomegaly.  EXTREMITIES:  No leg edema.  LABORATORY DATA:  CBC:  WBC 6.7, hemoglobin 11.2, hematocrit 35.2 and platelets 233.  PT 23.5, INR 2.05.  BMP:  Sodium 135, potassium 4.0, chloride 102, carbon dioxide 25, glucose 98, BUN 19, creatinine 0.64, calcium 9.0.  ASSESSMENT: 1. Urinary tract infection. 2. Change in mental status secondary to the above. 3. Deconditioning. 4. Atrial fibrillation on anticoagulation.  PLAN:  We will continue the patient on physical therapy, occupational therapy.  We will continue IV antibiotics.  We will continue anticoagulation.  The patient is waiting for placement.     Shaun Zuccaro D. Felecia Shelling, MD     TDF/MEDQ  D:  08/10/2011  T:  08/10/2011  Job:  [id number]

## 2011-08-10 NOTE — Progress Notes (Signed)
Physical Therapy Treatment Patient Name: HIMANI CORONA ZOXWR'U Date: 08/10/2011 TIME: [id number] Problem List: There is no problem list on file for this patient.  Past Medical History:  Past Medical History  Diagnosis Date  . Dementia   . Pacemaker   . Hypertension    Past Surgical History: History reviewed. No pertinent past surgical history. Precautions/Restrictions  Precautions Precautions: Fall Required Braces or Orthoses: No Restrictions Weight Bearing Restrictions: No Mobility (including Balance) Bed Mobility Sitting - Scoot to Edge of Bed: 6: Modified independent (Device/Increase time) Transfers Sit to Stand: 6: Modified independent (Device/Increase time) Stand to Sit: 6: Modified independent (Device/Increase time) Stand Pivot Transfers: 5: Supervision Stand Pivot Transfer Details (indicate cue type and reason): Bed<>BSC Ambulation/Gait Ambulation/Gait Assistance: 5: Supervision Ambulation/Gait Assistance Details (indicate cue type and reason): vc's needed for keeping RW close and to stand erect Ambulation Distance (Feet): 250 Feet Assistive device: Rolling walker Gait Pattern: Trunk flexed Gait velocity: very slow    Exercise     End of Session   PT Assessment/Plan  PT - Assessment/Plan Comments on Treatment Session: Pt tolerted gait well today;needed multiple vc's for keeping RW close;pt moves very slowly;deconditioned PT Goals  Acute Rehab PT Goals PT Goal: Ambulate - Progress: Progressing toward goal  Latanya Hemmer ATKINSO 08/10/2011, 1:02 PM

## 2011-08-10 NOTE — Progress Notes (Signed)
ANTICOAGULATION CONSULT NOTE   Pharmacy Consult for  Warfarin Indication: atrial fibrillation   No Known Allergies  Patient Measurements: Height: 5\' 4"  (162.6 cm) Weight: 132 lb 6.4 oz (60.056 kg) IBW/kg (Calculated) : 54.7   Vital Signs: Temp: 97.8 F (36.6 C) (09/12 0553) BP: 131/70 mmHg (09/12 0553) Pulse Rate: 71  (09/12 0553)  Labs:  Basename 08/10/11 0522 08/09/11 0526 08/08/11 0456  HGB 11.2* -- --  HCT 35.2* -- --  PLT 233 -- --  APTT -- -- --  LABPROT 23.5* 22.6* 23.4*  INR 2.05* 1.95* 2.04*  HEPARINUNFRC -- -- --  CREATININE 0.64 -- --  CRCLEARANCE -- -- --  CKTOTAL -- -- --  CKMB -- -- --  TROPONINI -- -- --   Medical History: Past Medical History  Diagnosis Date  . Dementia   . Pacemaker   . Hypertension    Medications:   Home dose is 3mg  daily  Assessment:  Therapeutic INR but on low end and trending back up.  Goal of Therapy:  INR 2-3   Plan: Coumadin 4mg  today INR daily until therapeutic and stable.  Lamonte Richer R 08/10/2011,6:48 AM

## 2011-08-11 LAB — PROTIME-INR
INR: 2.3 — ABNORMAL HIGH (ref 0.00–1.49)
Prothrombin Time: 25.7 seconds — ABNORMAL HIGH (ref 11.6–15.2)

## 2011-08-11 MED ORDER — WARFARIN SODIUM 2 MG PO TABS: 4.0000 mg | ORAL_TABLET | Freq: Every day | ORAL | Status: DC

## 2011-08-11 NOTE — Progress Notes (Signed)
Discharge summary: a/o.vss. Up with assistance. Saline lock removed. Discharge instructions given. Pt and family verbalized understanding of instructions. Left floor via wheelchair with nursing staff and family members.

## 2011-08-11 NOTE — Progress Notes (Signed)
ANTICOAGULATION CONSULT NOTE   Pharmacy Consult for  Warfarin Indication: atrial fibrillation   No Known Allergies  Patient Measurements: Height: 5\' 4"  (162.6 cm) Weight: 132 lb 6.4 oz (60.056 kg) IBW/kg (Calculated) : 54.7   Vital Signs: Temp: 97.5 F (36.4 C) (09/13 0651) Temp src: Oral (09/13 0651) BP: 128/66 mmHg (09/13 0651) Pulse Rate: 80  (09/13 0651)  Labs:  Basename 08/11/11 0505 08/10/11 0522 08/09/11 0526  HGB -- 11.2* --  HCT -- 35.2* --  PLT -- 233 --  APTT -- -- --  LABPROT 25.7* 23.5* 22.6*  INR 2.30* 2.05* 1.95*  HEPARINUNFRC -- -- --  CREATININE -- 0.64 --  CRCLEARANCE -- -- --  CKTOTAL -- -- --  CKMB -- -- --  TROPONINI -- -- --   Medical History: Past Medical History  Diagnosis Date  . Dementia   . Pacemaker   . Hypertension    Medications:   Home dose is 3mg  daily  Assessment:  Therapeutic INR but on low end and trending back up.  Goal of Therapy:  INR 2-3   Plan: Coumadin 4mg  daily for now. INR daily until therapeutic and stable.  [name], Lachae Hohler A 08/11/2011,7:49 AM

## 2011-08-11 NOTE — Discharge Summary (Signed)
NAMEREWA, WEISSBERG                ACCOUNT NO.:  [id number]  MEDICAL RECORD NO.:  [id number]  LOCATION:  A332                          FACILITY:  [facility]  PHYSICIAN:  Sahily Biddle D. Felecia Shelling, MD   DATE OF BIRTH:  Apr 03, 1927  DATE OF ADMISSION:  08/04/2011 DATE OF DISCHARGE:  09/13/2012LH                              DISCHARGE SUMMARY   DISCHARGE DIAGNOSES: 1. Urinary tract infection. 2. Change in mental status secondary to the urinary tract infection. 3. Severe deconditioning. 4. History of recurrent fall. 5. Atrial fibrillation. 6. Status post pacemaker insertion. 7. Hypertension. 8. Depression disorder.  DISCHARGE MEDICATIONS: 1. Diltiazem 180 mg p.o. daily. 2. Lovastatin 10 mg p.o. daily. 3. Lopressor 25 mg daily. 4. Remeron 15 mg at bedtime. 5. Multivitamin 1 tablet p.o. daily. 6. Omeprazole 20 mg daily. 7. Vesicare 5 mg daily. 8. Coumadin according to PT/INR.  DISPOSITION:  The patient will be discharged home in stable condition with home health.  DISCHARGE INSTRUCTIONS:  The patient will follow up with [facility] to monitor her PT/INR.  The patient will be followed in the office in 1- week duration.  HOSPITAL COURSE:  This is an 77 year old female patient with history of multiple medical illnesses who was brought to emergency room due to recurrent fall and change in her mental status.  The patient used to live at home.  Her family reported that she was very confused and had an episode of fall.  She was brought to emergency room and was evaluated. Her urinalysis showed sign of urinary tract infection.  She later grew Enterobacter aeruginosa in her urine culture.  She received IV antibiotics.  Her symptoms improved. Her mental status has improved.  The patient has been working with physical therapy.  She is still weak, however, able to ambulate.  She will be discharged home with home health and physical therapy to be continued in outpatient.     Yechiel Erny D. Felecia Shelling,  MD     TDF/MEDQ  D:  08/11/2011  T:  08/11/2011  Job:  [id number]

## 2011-09-27 ENCOUNTER — Other Ambulatory Visit: Payer: Self-pay | Admitting: *Deleted

## 2012-03-13 ENCOUNTER — Ambulatory Visit (HOSPITAL_COMMUNITY)
Admission: RE | Admit: 2012-03-13 | Discharge: 2012-03-13 | Disposition: A | Discharge status: Home / Self Care | Payer: Medicare Other | Source: Ambulatory Visit | Attending: Internal Medicine | Admitting: Internal Medicine

## 2012-03-13 ENCOUNTER — Other Ambulatory Visit (HOSPITAL_COMMUNITY): Payer: Self-pay | Admitting: Internal Medicine

## 2012-03-13 DIAGNOSIS — J40 Bronchitis, not specified as acute or chronic: Secondary | ICD-10-CM

## 2012-03-13 DIAGNOSIS — R05 Cough: Secondary | ICD-10-CM | POA: Insufficient documentation

## 2012-03-13 DIAGNOSIS — R059 Cough, unspecified: Secondary | ICD-10-CM | POA: Insufficient documentation

## 2012-03-13 DIAGNOSIS — I517 Cardiomegaly: Secondary | ICD-10-CM | POA: Insufficient documentation

## 2012-03-13 DIAGNOSIS — R0989 Other specified symptoms and signs involving the circulatory and respiratory systems: Secondary | ICD-10-CM | POA: Insufficient documentation

## 2012-03-30 ENCOUNTER — Ambulatory Visit (INDEPENDENT_AMBULATORY_CARE_PROVIDER_SITE_OTHER): Payer: Medicare Other | Admitting: Urology

## 2012-03-30 DIAGNOSIS — B373 Candidiasis of vulva and vagina: Secondary | ICD-10-CM

## 2012-03-30 DIAGNOSIS — N3020 Other chronic cystitis without hematuria: Secondary | ICD-10-CM

## 2012-03-30 DIAGNOSIS — N3941 Urge incontinence: Secondary | ICD-10-CM

## 2012-06-15 ENCOUNTER — Ambulatory Visit (INDEPENDENT_AMBULATORY_CARE_PROVIDER_SITE_OTHER): Payer: Medicare Other | Admitting: Urology

## 2012-06-15 DIAGNOSIS — N3941 Urge incontinence: Secondary | ICD-10-CM

## 2012-06-15 DIAGNOSIS — N952 Postmenopausal atrophic vaginitis: Secondary | ICD-10-CM

## 2012-06-15 DIAGNOSIS — N3020 Other chronic cystitis without hematuria: Secondary | ICD-10-CM

## 2012-08-29 ENCOUNTER — Encounter (HOSPITAL_COMMUNITY): Payer: Self-pay | Admitting: *Deleted

## 2012-08-29 ENCOUNTER — Inpatient Hospital Stay (HOSPITAL_COMMUNITY)
Admission: EM | Admit: 2012-08-29 | Discharge: 2012-09-03 | DRG: 690 | Disposition: A | Discharge status: Home Health | Payer: Medicare Other | Attending: Internal Medicine | Admitting: Internal Medicine

## 2012-08-29 ENCOUNTER — Emergency Department (HOSPITAL_COMMUNITY): Payer: Medicare Other

## 2012-08-29 DIAGNOSIS — I509 Heart failure, unspecified: Secondary | ICD-10-CM | POA: Diagnosis present

## 2012-08-29 DIAGNOSIS — R5381 Other malaise: Secondary | ICD-10-CM | POA: Diagnosis present

## 2012-08-29 DIAGNOSIS — Z880 Allergy status to penicillin: Secondary | ICD-10-CM

## 2012-08-29 DIAGNOSIS — Z950 Presence of cardiac pacemaker: Secondary | ICD-10-CM

## 2012-08-29 DIAGNOSIS — F0390 Unspecified dementia without behavioral disturbance: Secondary | ICD-10-CM | POA: Diagnosis present

## 2012-08-29 DIAGNOSIS — I5022 Chronic systolic (congestive) heart failure: Secondary | ICD-10-CM | POA: Diagnosis present

## 2012-08-29 DIAGNOSIS — R5383 Other fatigue: Secondary | ICD-10-CM

## 2012-08-29 DIAGNOSIS — B961 Klebsiella pneumoniae [K. pneumoniae] as the cause of diseases classified elsewhere: Secondary | ICD-10-CM | POA: Diagnosis present

## 2012-08-29 DIAGNOSIS — Z79899 Other long term (current) drug therapy: Secondary | ICD-10-CM

## 2012-08-29 DIAGNOSIS — Z7901 Long term (current) use of anticoagulants: Secondary | ICD-10-CM

## 2012-08-29 DIAGNOSIS — I10 Essential (primary) hypertension: Secondary | ICD-10-CM | POA: Diagnosis present

## 2012-08-29 DIAGNOSIS — I4891 Unspecified atrial fibrillation: Secondary | ICD-10-CM | POA: Diagnosis present

## 2012-08-29 DIAGNOSIS — N390 Urinary tract infection, site not specified: Principal | ICD-10-CM | POA: Diagnosis present

## 2012-08-29 DIAGNOSIS — K219 Gastro-esophageal reflux disease without esophagitis: Secondary | ICD-10-CM | POA: Diagnosis present

## 2012-08-29 HISTORY — DX: Gastro-esophageal reflux disease without esophagitis: K21.9

## 2012-08-29 LAB — CBC WITH DIFFERENTIAL/PLATELET
Basophils Relative: 0 % (ref 0–1)
HCT: 37.1 % (ref 36.0–46.0)
Hemoglobin: 12.3 g/dL (ref 12.0–15.0)
Lymphocytes Relative: 10 % — ABNORMAL LOW (ref 12–46)
Lymphs Abs: 1.1 10*3/uL (ref 0.7–4.0)
MCV: 90.3 fL (ref 78.0–100.0)
Monocytes Relative: 10 % (ref 3–12)
Neutro Abs: 9 10*3/uL — ABNORMAL HIGH (ref 1.7–7.7)
WBC: 11.2 10*3/uL — ABNORMAL HIGH (ref 4.0–10.5)

## 2012-08-29 LAB — URINE MICROSCOPIC-ADD ON

## 2012-08-29 LAB — COMPREHENSIVE METABOLIC PANEL
ALT: 10 U/L (ref 0–35)
Albumin: 3.4 g/dL — ABNORMAL LOW (ref 3.5–5.2)
BUN: 19 mg/dL (ref 6–23)
Calcium: 9.3 mg/dL (ref 8.4–10.5)
GFR calc Af Amer: 64 mL/min — ABNORMAL LOW (ref >90)
Glucose, Bld: 118 mg/dL — ABNORMAL HIGH (ref 70–99)
Potassium: 4.6 mEq/L (ref 3.5–5.1)
Sodium: 133 mEq/L — ABNORMAL LOW (ref 135–145)
Total Protein: 6.9 g/dL (ref 6.0–8.3)

## 2012-08-29 LAB — URINALYSIS, ROUTINE W REFLEX MICROSCOPIC
Nitrite: POSITIVE — AB
Protein, ur: 100 mg/dL — AB
Specific Gravity, Urine: 1.01 (ref 1.005–1.030)
Urobilinogen, UA: 1 mg/dL (ref 0.0–1.0)

## 2012-08-29 LAB — TROPONIN I: Troponin I: <0.3 ng/mL (ref <0.30)

## 2012-08-29 LAB — PROTIME-INR: INR: 1.08 (ref 0.00–1.49)

## 2012-08-29 MED ORDER — SIMVASTATIN 10 MG PO TABS
10.0000 mg | ORAL_TABLET | Freq: Every evening | ORAL | Status: DC
  Administered 2012-08-29 – 2012-09-02 (×5): 10 mg via ORAL
  Filled 2012-08-29 (×5): qty 1

## 2012-08-29 MED ORDER — WARFARIN SODIUM 2.5 MG PO TABS
4.5000 mg | ORAL_TABLET | Freq: Once | ORAL | Status: AC
  Administered 2012-08-29: 4.5 mg via ORAL
  Filled 2012-08-29: qty 1

## 2012-08-29 MED ORDER — TRAZODONE HCL 50 MG PO TABS
50.0000 mg | ORAL_TABLET | Freq: Every day | ORAL | Status: DC
  Administered 2012-08-29 – 2012-09-02 (×5): 50 mg via ORAL
  Filled 2012-08-29 (×5): qty 1

## 2012-08-29 MED ORDER — WARFARIN - PHARMACIST DOSING INPATIENT: Freq: Every day | Status: DC

## 2012-08-29 MED ORDER — CIPROFLOXACIN IN D5W 400 MG/200ML IV SOLN
400.0000 mg | Freq: Two times a day (BID) | INTRAVENOUS | Status: DC
  Administered 2012-08-30 – 2012-08-31 (×4): 400 mg via INTRAVENOUS
  Filled 2012-08-29 (×9): qty 200

## 2012-08-29 MED ORDER — ACETAMINOPHEN 160 MG/5ML PO SOLN
600.0000 mg | Freq: Once | ORAL | Status: AC
  Administered 2012-08-29: 600 mg via ORAL
  Filled 2012-08-29: qty 20.3

## 2012-08-29 MED ORDER — CIPROFLOXACIN IN D5W 400 MG/200ML IV SOLN
INTRAVENOUS | Status: AC
  Filled 2012-08-29: qty 200

## 2012-08-29 MED ORDER — NAPHAZOLINE HCL 0.1 % OP SOLN
1.0000 [drp] | Freq: Four times a day (QID) | OPHTHALMIC | Status: DC | PRN
  Filled 2012-08-29: qty 15

## 2012-08-29 MED ORDER — SODIUM CHLORIDE 0.9 % IV BOLUS (SEPSIS)
250.0000 mL | Freq: Once | INTRAVENOUS | Status: AC
  Administered 2012-08-29: 250 mL via INTRAVENOUS

## 2012-08-29 MED ORDER — CIPROFLOXACIN IN D5W 400 MG/200ML IV SOLN
400.0000 mg | Freq: Once | INTRAVENOUS | Status: AC
  Administered 2012-08-29: 400 mg via INTRAVENOUS
  Filled 2012-08-29: qty 200

## 2012-08-29 MED ORDER — PANTOPRAZOLE SODIUM 40 MG PO TBEC
40.0000 mg | DELAYED_RELEASE_TABLET | Freq: Every day | ORAL | Status: DC
  Administered 2012-08-30 – 2012-09-03 (×5): 40 mg via ORAL
  Filled 2012-08-29 (×5): qty 1

## 2012-08-29 MED ORDER — METOPROLOL TARTRATE 25 MG PO TABS
25.0000 mg | ORAL_TABLET | Freq: Every evening | ORAL | Status: DC
  Administered 2012-08-29 – 2012-09-02 (×4): 25 mg via ORAL
  Filled 2012-08-29 (×5): qty 1

## 2012-08-29 MED ORDER — TRAVOPROST (BAK FREE) 0.004 % OP SOLN
1.0000 [drp] | Freq: Every day | OPHTHALMIC | Status: DC
  Administered 2012-08-30 – 2012-09-02 (×4): 1 [drp] via OPHTHALMIC
  Filled 2012-08-29: qty 2.5

## 2012-08-29 MED ORDER — WARFARIN SODIUM 2 MG PO TABS: 3.0000 mg | ORAL_TABLET | Freq: Every day | ORAL | Status: DC

## 2012-08-29 MED ORDER — ACETAMINOPHEN 500 MG PO TABS: 500.0000 mg | ORAL_TABLET | ORAL | Status: DC | PRN

## 2012-08-29 MED ORDER — POTASSIUM CHLORIDE CRYS ER 20 MEQ PO TBCR
20.0000 meq | EXTENDED_RELEASE_TABLET | Freq: Every day | ORAL | Status: DC
  Administered 2012-08-30 – 2012-09-03 (×5): 20 meq via ORAL
  Filled 2012-08-29 (×5): qty 1

## 2012-08-29 MED ORDER — SODIUM CHLORIDE 0.9 % IV SOLN
INTRAVENOUS | Status: DC
  Administered 2012-08-30: 08:00:00 via INTRAVENOUS

## 2012-08-29 MED ORDER — FUROSEMIDE 20 MG PO TABS
20.0000 mg | ORAL_TABLET | Freq: Every day | ORAL | Status: DC
  Administered 2012-08-30 – 2012-09-03 (×5): 20 mg via ORAL
  Filled 2012-08-29 (×5): qty 1

## 2012-08-29 NOTE — ED Notes (Signed)
Per EMS - generalized weakness, body aches, and malaise x 2 days.

## 2012-08-29 NOTE — Progress Notes (Signed)
ANTICOAGULATION CONSULT NOTE - Initial Consult  Pharmacy Consult for  warfarin Indication: afib (with occasional PVC's - hx of pacemaker)  Allergies  Allergen Reactions  . Penicillins     REACTION: Unknown    Patient Measurements: Height: 5\' 1"  (154.9 cm) Weight: 132 lb 3.2 oz (59.966 kg) IBW/kg (Calculated) : 47.8    Vital Signs: Temp: 98.1 F (36.7 C) (10/02 2154) Temp src: Oral (10/02 2154) BP: 114/61 mmHg (10/02 2154) Pulse Rate: 84  (10/02 2154)  Labs:  Basename 08/29/12 1702  HGB 12.3  HCT 37.1  PLT 144*  APTT --  LABPROT 13.9  INR 1.08  HEPARINUNFRC --  CREATININE 0.93  CKTOTAL --  CKMB --  TROPONINI <0.30  INR= 1.08 at 1800 tonight  Estimated Creatinine Clearance: 38.1 ml/min (by C-G formula based on Cr of 0.93).   Medical History: Past Medical History  Diagnosis Date  . Dementia   . Pacemaker   . Hypertension   . Acid reflux     Medications:  Scheduled:    . acetaminophen (TYLENOL) oral liquid 160 mg/5 mL  600 mg Oral Once  . ciprofloxacin  400 mg Intravenous Once  . ciprofloxacin  400 mg Intravenous Q12H  . furosemide  20 mg Oral Daily  . metoprolol tartrate  25 mg Oral QPM  . pantoprazole  40 mg Oral Q1200  . potassium chloride SA  20 mEq Oral Daily  . simvastatin  10 mg Oral QPM  . sodium chloride  250 mL Intravenous Once  . Travoprost (BAK Free)  1 drop Right Eye QHS  . traZODone  50 mg Oral QHS  . warfarin  4.5 mg Oral Once  . Warfarin - Pharmacist Dosing Inpatient   Does not apply q1800  . DISCONTD: warfarin  3 mg Oral Daily   PRN: acetaminophen, naphazoline  Assessment:   Patient with routine warfarin schedule of 4.5mg  Mon & Fri / 3mg  on all other days.  Warfarin tx interrupted for oral surgery; restarted last night with presumably 3mg  dose.  Sub therapeutic INR tonight = 1.08.  78yr old will have delayed response to oral warfarin tx.  Plan one extra "bump" dose of 4.5mg  tonight, and will probably resume previous dosing  schedule.  No bleeding problems noted at this time.  Goal of Therapy:  Maintain INR 2-3    Plan:  1.  Warfarin 4.5mg  tonight 2.  Daily INR 3.  Clinical pharmacist to review for further orders in am  Jahlisa Rossitto, Shon Baton 08/29/2012,10:41 PM

## 2012-08-29 NOTE — ED Notes (Signed)
Called to give report, was told they would call me back shortly

## 2012-08-29 NOTE — ED Provider Notes (Signed)
History  This chart was scribed for Shelda Jakes, MD by Bennett Scrape. This patient was seen in room [facility]03[facility] and the patient's care was started at 4:08PM.  CSN: [id number]  Arrival date & time 08/29/12  1558   First MD Initiated Contact with Patient 08/29/12 1608     Level 5 Caveat-Dementia   Chief Complaint  Patient presents with  . Fatigue  . Generalized Body Aches     The history is provided by a relative (daughter). No language interpreter was used.    [name] is a 78 y.o. female with a h/o dementia brought in by ambulance, who presents to the Emergency Department for generalized weakness and chills. Daughter reports that the pt's caretaker stated that the pt seemed weak and had severe chills. She is concerned that the symptoms are from a possible infection that has occurred after the pt had her upper teeth pulled one week ago and her lower teeth pulled 2 days ago. She reports that the pt was not given antibiotics and states that the pt was off of Coumadin for the procedure and started back up yesterday. Daughter visited pt today and states that she appeared at baseline and reports that the pt appears at her baseline currently. Pt denies having any pain. She also has a h/o HTN and GERD. She denies smoking and alcohol use.  Past Medical History  Diagnosis Date  . Dementia   . Pacemaker   . Hypertension   . Acid reflux     History reviewed. No pertinent past surgical history.  No family history on file.  History  Substance Use Topics  . Smoking status: Never Smoker   . Smokeless tobacco: Not on file  . Alcohol Use: No    No OB history provided.  Review of Systems  Unable to perform ROS: Dementia    Allergies  Penicillins  Home Medications   Current Outpatient Rx  Name Route Sig Dispense Refill  . ACETAMINOPHEN 500 MG PO TABS Oral Take 500 mg by mouth once as needed. For post dental procedure pain    . CLOTRIMAZOLE-BETAMETHASONE 1-0.05 % EX  CREA Topical Apply 1 application topically 2 (two) times daily.    Marland Kitchen DIPHENHYDRAMINE HCL 25 MG PO CAPS Oral Take 25 mg by mouth daily as needed. For allergies    . FUROSEMIDE 20 MG PO TABS Oral Take 20 mg by mouth daily.    Marland Kitchen METOPROLOL TARTRATE 25 MG PO TABS Oral Take 25 mg by mouth every evening.     . CENTRUM SILVER PO Oral Take 1 tablet by mouth daily.      Marland Kitchen OMEPRAZOLE 20 MG PO CPDR Oral Take 20 mg by mouth daily.      Marland Kitchen POTASSIUM CHLORIDE CRYS ER 20 MEQ PO TBCR Oral Take 20 mEq by mouth daily.    Marland Kitchen SIMVASTATIN 20 MG PO TABS Oral Take 10 mg by mouth every evening.    . TETRAHYDROZOLINE HCL 0.05 % OP SOLN Both Eyes Place 1 drop into both eyes daily as needed. For dry eye relief    . TRAVOPROST (BAK FREE) 0.004 % OP SOLN Right Eye Place 1 drop into the right eye at bedtime.      . TRAZODONE HCL 50 MG PO TABS Oral Take 50 mg by mouth at bedtime.    . WARFARIN SODIUM 3 MG PO TABS Oral Take 3 mg by mouth daily. *Take one tablet by mouth every evening. Take one & one-half tablet (4.5mg   total) on Mondays and Fridays only*      Triage Vitals: BP 129/46  Pulse 81  Temp 97.6 F (36.4 C) (Oral)  Resp 20  Wt 132 lb (59.875 kg)  SpO2 95%  Physical Exam  Nursing note and vitals reviewed. Constitutional: She appears well-developed and well-nourished. No distress.  HENT:  Head: Normocephalic and atraumatic.  Mouth/Throat: Oropharynx is clear and moist.       Upper gums appear well-healed, lower gums appear to have old blood present on them  Eyes: Conjunctivae normal and EOM are normal. Pupils are equal, round, and reactive to light. No scleral icterus.  Neck: Neck supple. No tracheal deviation present.  Cardiovascular: Normal rate and normal heart sounds.  An irregular rhythm present.  No murmur heard.      Pacemaker fires occasionally    Pulmonary/Chest: Effort normal and breath sounds normal. No respiratory distress.  Abdominal: Soft. Bowel sounds are normal. There is no tenderness.    Musculoskeletal: Normal range of motion. She exhibits no edema (no ankle swelling).       1 second cap refill in both feet, able to move both set of toes  Lymphadenopathy:    She has no cervical adenopathy.  Neurological: She is alert.  Skin: Skin is warm and dry.       Ecchymosis on left chin  Psychiatric: She has a normal mood and affect. Her behavior is normal.    ED Course  Procedures (including critical care time)  DIAGNOSTIC STUDIES: Oxygen Saturation is 95% on [place], adequate by my interpretation.    COORDINATION OF CARE: 4:25PM-Discussed treatment plan which includes a CT scan of the head and a CXR with pt's daughter at bedside and she agreed to plan.  4:49PM-Ordered CT head, CXR, CBC panel, UA, and metabolic panel.  1:61WR-[id number] 250 mL of Bolus.  5:45PM-Ordered 600 mg of acetaminophen.  8:15PM-Ordered 400 mg of Cipro.  8:45PM-Consult complete with Dr. Felecia Shelling. Patient case explained and discussed. Dr. Felecia Shelling agrees to admit patient for further evaluation and treatment to [facility]. Call ended 8:50PM.  Labs Reviewed  URINALYSIS, ROUTINE W REFLEX MICROSCOPIC - Abnormal; Notable for the following:    APPearance HAZY (*)     Hgb urine dipstick LARGE (*)     Protein, ur 100 (*)     Nitrite POSITIVE (*)     Leukocytes, UA LARGE (*)     All other components within normal limits  COMPREHENSIVE METABOLIC PANEL - Abnormal; Notable for the following:    Sodium 133 (*)     Glucose, Bld 118 (*)     Albumin 3.4 (*)     GFR calc non Af Amer 55 (*)     GFR calc Af Amer 64 (*)     All other components within normal limits  CBC WITH DIFFERENTIAL - Abnormal; Notable for the following:    WBC 11.2 (*)     RDW 15.7 (*)     Platelets 144 (*)     Neutrophils Relative 80 (*)     Lymphocytes Relative 10 (*)     Neutro Abs 9.0 (*)     Monocytes Absolute 1.1 (*)     All other components within normal limits  URINE MICROSCOPIC-ADD ON - Abnormal; Notable for the following:     Bacteria, UA MANY (*)     All other components within normal limits  PROTIME-INR  TROPONIN I  URINE CULTURE   Ct Head Wo Contrast  08/29/2012  *RADIOLOGY REPORT*  Clinical Data: Weakness and fatigue  CT HEAD WITHOUT CONTRAST  Technique:  Contiguous axial images were obtained from the base of the skull through the vertex without contrast.  Comparison: 08/04/2011  Findings: There is no evidence for acute hemorrhage, hydrocephalus, mass lesion, or abnormal extra-axial fluid collection.  No definite CT evidence for acute infarction.  Diffuse loss of parenchymal volume is consistent with atrophy. Patchy low attenuation in the deep hemispheric and periventricular white matter is nonspecific, but likely reflects chronic microvascular ischemic demyelination.  The visualized paranasal sinuses and mastoid air cells are clear. The left maxillary sinus is hypoplastic.  IMPRESSION: No acute intracranial abnormality.  Stable exam.  Atrophy with chronic small vessel white matter disease.   Original Report Authenticated By: ERIC A. MANSELL, M.D.    Dg Chest Port 1 View  08/29/2012  *RADIOLOGY REPORT*  Clinical Data: Generalized body aches.  PORTABLE CHEST - 1 VIEW  Comparison: 03/13/2012  Findings: Right lung is clear.  There is atelectasis or infiltrate at the left base. Interstitial markings are diffusely coarsened with chronic features. The cardiopericardial silhouette is enlarged.  Left-sided dual lead pacer remains in place.  Bones are diffusely demineralized.  IMPRESSION: Cardiomegaly with diffuse chronic interstitial changes in left base atelectasis or infiltrate.   Original Report Authenticated By: ERIC A. MANSELL, M.D.      Date: 08/29/2012  Rate: 100  Rhythm: atrial fibrillation and premature ventricular contractions (PVC)  QRS Axis: normal  Intervals: normal  ST/T Wave abnormalities: nonspecific ST/T changes  Conduction Disutrbances:none  Narrative Interpretation:   Old EKG Reviewed:  unchanged Patient with ventricular pacemaker. The PVCs are actually ventricular paced beats. No significant change in EKG from 08/04/2011.  Results for orders placed during the hospital encounter of 08/29/12  URINALYSIS, ROUTINE W REFLEX MICROSCOPIC      Component Value Range   Color, Urine YELLOW  YELLOW   APPearance HAZY (*) CLEAR   Specific Gravity, Urine 1.010  1.005 - 1.030   pH 6.5  5.0 - 8.0   Glucose, UA NEGATIVE  NEGATIVE mg/dL   Hgb urine dipstick LARGE (*) NEGATIVE   Bilirubin Urine NEGATIVE  NEGATIVE   Ketones, ur NEGATIVE  NEGATIVE mg/dL   Protein, ur 098 (*) NEGATIVE mg/dL   Urobilinogen, UA 1.0  0.0 - 1.0 mg/dL   Nitrite POSITIVE (*) NEGATIVE   Leukocytes, UA LARGE (*) NEGATIVE  COMPREHENSIVE METABOLIC PANEL      Component Value Range   Sodium 133 (*) 135 - 145 mEq/L   Potassium 4.6  3.5 - 5.1 mEq/L   Chloride 99  96 - 112 mEq/L   CO2 27  19 - 32 mEq/L   Glucose, Bld 118 (*) 70 - 99 mg/dL   BUN 19  6 - 23 mg/dL   Creatinine, Ser 1.19  0.50 - 1.10 mg/dL   Calcium 9.3  8.4 - 14.7 mg/dL   Total Protein 6.9  6.0 - 8.3 g/dL   Albumin 3.4 (*) 3.5 - 5.2 g/dL   AST 17  0 - 37 U/L   ALT 10  0 - 35 U/L   Alkaline Phosphatase 72  39 - 117 U/L   Total Bilirubin 1.0  0.3 - 1.2 mg/dL   GFR calc non Af Amer 55 (*) >90 mL/min   GFR calc Af Amer 64 (*) >90 mL/min  CBC WITH DIFFERENTIAL      Component Value Range   WBC 11.2 (*) 4.0 - 10.5 K/uL   RBC 4.11  3.87 -  5.11 MIL/uL   Hemoglobin 12.3  12.0 - 15.0 g/dL   HCT 16.1  09.6 - 04.5 %   MCV 90.3  78.0 - 100.0 fL   MCH 29.9  26.0 - 34.0 pg   MCHC 33.2  30.0 - 36.0 g/dL   RDW 40.9 (*) 81.1 - 91.4 %   Platelets 144 (*) 150 - 400 K/uL   Neutrophils Relative 80 (*) 43 - 77 %   Lymphocytes Relative 10 (*) 12 - 46 %   Monocytes Relative 10  3 - 12 %   Eosinophils Relative 0  0 - 5 %   Basophils Relative 0  0 - 1 %   Neutro Abs 9.0 (*) 1.7 - 7.7 K/uL   Lymphs Abs 1.1  0.7 - 4.0 K/uL   Monocytes Absolute 1.1 (*) 0.1 - 1.0  K/uL   Eosinophils Absolute 0.0  0.0 - 0.7 K/uL   Basophils Absolute 0.0  0.0 - 0.1 K/uL   WBC Morphology ATYPICAL LYMPHOCYTES    PROTIME-INR      Component Value Range   Prothrombin Time 13.9  11.6 - 15.2 seconds   INR 1.08  0.00 - 1.49  TROPONIN I      Component Value Range   Troponin I <0.30  <0.30 ng/mL  URINE MICROSCOPIC-ADD ON      Component Value Range   WBC, UA TOO NUMEROUS TO COUNT  <3 WBC/hpf   RBC / HPF 7-10  <3 RBC/hpf   Bacteria, UA MANY (*) RARE     1. Fatigue   2. Urinary tract infection       MDM   Patient with distinct recurrent urinary tract infection was recently treated for same proximally 1-2 weeks ago. Patient also with confusion fatigue over this. Patient will be admitted by Dr. Felecia Shelling to MedSurg floor. Temporary middle orders done. Patient urine culture sent and treated with Cipro IV piggyback in the emergency department for the urinary tract infection.     I personally performed the services described in this documentation, which was scribed in my presence. The recorded information has been reviewed and considered.     Shelda Jakes, MD 08/29/12 [phone]

## 2012-08-29 NOTE — ED Notes (Signed)
Pts family member states generalized weakness, malaise and fever x2 days. States she lives at home with assistance and one of her caretakers noted she was acting differently. Pt resting in bed at this time, was able to talk with me some. NAD noted at this time.

## 2012-08-30 LAB — BASIC METABOLIC PANEL
BUN: 15 mg/dL (ref 6–23)
Calcium: 8.9 mg/dL (ref 8.4–10.5)
Creatinine, Ser: 0.72 mg/dL (ref 0.50–1.10)
GFR calc Af Amer: 90 mL/min — ABNORMAL LOW (ref >90)
GFR calc non Af Amer: 77 mL/min — ABNORMAL LOW (ref >90)

## 2012-08-30 LAB — CBC
HCT: 34.9 % — ABNORMAL LOW (ref 36.0–46.0)
MCHC: 32.7 g/dL (ref 30.0–36.0)
MCV: 90.6 fL (ref 78.0–100.0)
RDW: 15.9 % — ABNORMAL HIGH (ref 11.5–15.5)

## 2012-08-30 MED ORDER — CHLORHEXIDINE GLUCONATE 0.12 % MT SOLN
15.0000 mL | Freq: Two times a day (BID) | OROMUCOSAL | Status: DC
  Administered 2012-08-30 – 2012-09-03 (×9): 15 mL via OROMUCOSAL
  Filled 2012-08-30 (×9): qty 15

## 2012-08-30 MED ORDER — BIOTENE DRY MOUTH MT LIQD
15.0000 mL | Freq: Two times a day (BID) | OROMUCOSAL | Status: DC
  Administered 2012-08-30 – 2012-09-02 (×8): 15 mL via OROMUCOSAL

## 2012-08-30 MED ORDER — WARFARIN SODIUM 5 MG PO TABS
5.0000 mg | ORAL_TABLET | Freq: Once | ORAL | Status: AC
  Administered 2012-08-30: 5 mg via ORAL
  Filled 2012-08-30: qty 1

## 2012-08-30 NOTE — Evaluation (Signed)
Physical Therapy Evaluation Patient Details Name: [name] MRN: [id number] DOB: Jun 20, 1927 Today's Date: 08/30/2012 Time: 7829-5621 PT Time Calculation (min): 39 min  PT Assessment / Plan / Recommendation Clinical Impression               Pt.  was seen for eval.  She has dementia, but is able to converse normally today and is very appropriate.  She iis mildly deconditioned at baseline and currentl.y is most stable using a walker for gait.  She has an aide 3 days a week, but with her dementia, she would probably be best suited in ACLF.  I am going to recommend HHPT at d/c.  Will transition gait to nursing service while in house and have left a walker in her room to be used.          PT Assessment  All further PT needs can be met in the next venue of care    Follow Up Recommendations  Home health PT    Barriers to Discharge        Equipment Recommendations  None recommended by PT    Recommendations for Other Services     Frequency      Precautions / Restrictions Precautions Precautions: Fall Restrictions Weight Bearing Restrictions: No   Pertinent Vitals/Pain       Mobility  Bed Mobility Bed Mobility: Sit to Supine;Supine to Sit Supine to Sit: 6: Modified independent (Device/Increase time) Sit to Supine: 6: Modified independent (Device/Increase time) Transfers Transfers: Sit to Stand;Stand to Sit Sit to Stand: 6: Modified independent (Device/Increase time) Stand to Sit: 6: Modified independent (Device/Increase time) Ambulation/Gait Ambulation/Gait Assistance: 5: Supervision Ambulation Distance (Feet): 600 Feet Assistive device: Rolling walker Ambulation/Gait Assistance Details: pt ususually ambulates with no assist device or "furniture walks" at home, inside.  She wates that she will use her walker when outside of the home.  Currently, a walker should be used at all times due to mild instability with gait.  It is certainly possible that she should have been using her  walker at all times PTA.   Gait Pattern: Within Functional Limits General Gait Details: o fatigue from distance walked Stairs: No    Shoulder Instructions     Exercises     PT Diagnosis: Generalized weakness  PT Problem List: Decreased cognition;Decreased safety awareness (HHPT for home safety eval) PT Treatment Interventions:     PT Goals    Visit Information  Last PT Received On: 08/30/12    Subjective Data  Subjective: I feel better today Patient Stated Goal: none stated   Prior Functioning  Home Living Lives With: Alone Available Help at Discharge: Personal care attendant;Friend(s);Family Type of Home: Apartment Home Access: Level entry Home Layout: One level Bathroom Shower/Tub: Engineer, manufacturing systems: Standard Home Adaptive Equipment: Environmental consultant - rolling;Straight cane Prior Function Level of Independence: Needs assistance Needs Assistance: Bathing;Light Housekeeping Able to Take Stairs?: No Driving: No Vocation: Retired Musician: No difficulties    Cognition  Overall Cognitive Status: History of cognitive impairments - at baseline Arousal/Alertness: Awake/alert Orientation Level: Appears intact for tasks assessed Behavior During Session: [facility] for tasks performed    Extremity/Trunk Assessment Right Lower Extremity Assessment RLE ROM/Strength/Tone: WFL for tasks assessed RLE Sensation: WFL - Light Touch RLE Coordination: WFL - gross motor Left Lower Extremity Assessment LLE ROM/Strength/Tone: WFL for tasks assessed LLE Sensation: WFL - Light Touch LLE Coordination: WFL - gross motor Trunk Assessment Trunk Assessment: Normal   Balance Balance Balance Assessed: No  End of Session PT - End of Session Equipment Utilized During Treatment: Gait belt Activity Tolerance: Patient tolerated treatment well Patient left: in bed;with call bell/phone within reach;with bed alarm set Nurse Communication: Mobility status  GP     Konrad Penta 08/30/2012, 11:52 AM

## 2012-08-30 NOTE — H&P (Signed)
[name] MRN: [id number] DOB/AGE: 76/03/1927 76 y.o. Primary Care Physician:Abbygale Lapid, MD Admit date: 08/29/2012 Chief Complaint:  Generalized weakness and bodyaching HPI:  This is an 78 years old female patient with history of multiple medical illness was brought to Er by her daughter with the above complaint. She was treated for UTI several times. Her general condition was progressively declining. Patient was evaluated in ER and she was found to have UTI. She was started on IV antibiotics and was admitted for further treatment. Patient very weak and demented unable to give history for system review.   Past Medical History  Diagnosis Date  . Dementia   . Pacemaker   . Hypertension   . Acid reflux    History reviewed. No pertinent past surgical history.      History reviewed. No pertinent family history.  Social History:  reports that she has never smoked. She does not have any smokeless tobacco history on file. She reports that she does not drink alcohol or use illicit drugs.   Allergies:  Allergies  Allergen Reactions  . Penicillins     REACTION: Unknown    Medications Prior to Admission  Medication Sig Dispense Refill  . acetaminophen (TYLENOL) 500 MG tablet Take 500 mg by mouth once as needed. For post dental procedure pain      . clotrimazole-betamethasone (LOTRISONE) cream Apply 1 application topically 2 (two) times daily.      . diphenhydrAMINE (ALLERGY MEDICATION) 25 mg capsule Take 25 mg by mouth daily as needed. For allergies      . furosemide (LASIX) 20 MG tablet Take 20 mg by mouth daily.      . metoprolol tartrate (LOPRESSOR) 25 MG tablet Take 25 mg by mouth every evening.       . Multiple Vitamins-Minerals (CENTRUM SILVER PO) Take 1 tablet by mouth daily.        Marland Kitchen omeprazole (PRILOSEC) 20 MG capsule Take 20 mg by mouth daily.        . potassium chloride SA (K-DUR,KLOR-CON) 20 MEQ tablet Take 20 mEq by mouth daily.      . simvastatin (ZOCOR) 20 MG  tablet Take 10 mg by mouth every evening.      Marland Kitchen tetrahydrozoline (EYE DROPS) 0.05 % ophthalmic solution Place 1 drop into both eyes daily as needed. For dry eye relief      . Travoprost, BAK Free, (TRAVATAMN) 0.004 % SOLN ophthalmic solution Place 1 drop into the right eye at bedtime.        . traZODone (DESYREL) 50 MG tablet Take 50 mg by mouth at bedtime.      Marland Kitchen warfarin (COUMADIN) 3 MG tablet Take 3 mg by mouth daily. *Take one tablet by mouth every evening. Take one & one-half tablet (4.5mg  total) on Mondays and Fridays only*           WUJ:WJXBJ from the symptoms mentioned above,there are no other symptoms referable to all systems reviewed.  Physical Exam: Blood pressure 133/47, pulse 89, temperature 97.9 F (36.6 C), temperature source Oral, resp. rate 16, height 5\' 1"  (1.549 m), weight 59.966 kg (132 lb 3.2 oz), SpO2 97.00%. General condition-- chronically sick looking and fragile HE ENT- pupils are equal and reactive, neck supple Chest- clear lung field CVS- S1 and S2 heard no murmur or gallop ABD- soft and lax, bowel sound is + EXT- no leg edema     Basename 08/29/12 1702  WBC 11.2*  NEUTROABS 9.0*  HGB 12.3  HCT  37.1  MCV 90.3  PLT 144*    Basename 08/29/12 1702  NA 133*  K 4.6  CL 99  CO2 27  GLUCOSE 118*  BUN 19  CREATININE 0.93  CALCIUM 9.3  MG --  lablast2(ast:2,ALT:2,alkphos:2,bilitot:2,prot:2,albumin:2)@    No results found for this or any previous visit (from the past 240 hour(s)).   Ct Head Wo Contrast  08/29/2012  *RADIOLOGY REPORT*  Clinical Data: Weakness and fatigue  CT HEAD WITHOUT CONTRAST  Technique:  Contiguous axial images were obtained from the base of the skull through the vertex without contrast.  Comparison: 08/04/2011  Findings: There is no evidence for acute hemorrhage, hydrocephalus, mass lesion, or abnormal extra-axial fluid collection.  No definite CT evidence for acute infarction.  Diffuse loss of parenchymal volume is consistent  with atrophy. Patchy low attenuation in the deep hemispheric and periventricular white matter is nonspecific, but likely reflects chronic microvascular ischemic demyelination.  The visualized paranasal sinuses and mastoid air cells are clear. The left maxillary sinus is hypoplastic.  IMPRESSION: No acute intracranial abnormality.  Stable exam.  Atrophy with chronic small vessel white matter disease.   Original Report Authenticated By: ERIC A. MANSELL, M.D.    Dg Chest Port 1 View  08/29/2012  *RADIOLOGY REPORT*  Clinical Data: Generalized body aches.  PORTABLE CHEST - 1 VIEW  Comparison: 03/13/2012  Findings: Right lung is clear.  There is atelectasis or infiltrate at the left base. Interstitial markings are diffusely coarsened with chronic features. The cardiopericardial silhouette is enlarged.  Left-sided dual lead pacer remains in place.  Bones are diffusely demineralized.  IMPRESSION: Cardiomegaly with diffuse chronic interstitial changes in left base atelectasis or infiltrate.   Original Report Authenticated By: ERIC A. MANSELL, M.D.    Impression:  1. UTI  2. Atrial fibrillation on anticoagulation  3. Chronic systolic CHF  4. Dementia  5.hypertension  6. S/P pace maker insertion  7. Generalized weaknesss  Active Problems:  * No active hospital problems. *      Plan: 1. Continue IV antibiotics  2. Continue anticoagulation  3. PT consult  4. Supportive care.   Crysten Kaman   08/30/2012, 7:59 AM

## 2012-08-30 NOTE — Evaluation (Signed)
Occupational Therapy Evaluation Patient Details Name: [name] MRN: [id number] DOB: August 22, 1927 Today's Date: 08/30/2012 Time: 4098-1191 OT Time Calculation (min): 16 min  OT Assessment / Plan / Recommendation Clinical Impression  Patient is a 78 y/o female s/p UTI and Fatigue presenting to acute OT with deficits below. Patient states that an aide helps her from 1-3pm Mon, Wed, and Fri with showers and house cleaning. Patient receives Meals on Wheels and sometimes meals from her daughter. Patient states that her daughter comes by once in a while when she has the day off from work and they go out and do things. Other than that, patient is alone by herself and states that some days she doesn't get out of bed because she has nothing to do and she is weak some days. Patient states she remembers to eat 3 meals a day. Due to her cognitive deficits/dementia I do not recommend that Deloria stays alone for long periods of time. I recommend Laurina move into an ALF or have someone with her 24 hours a day for safety.    OT Assessment  Patient does not need any further OT services    Follow Up Recommendations  Other (comment) (ALF)       Equipment Recommendations  None recommended by OT          Precautions / Restrictions Precautions Precautions: Fall   Pertinent Vitals/Pain States her stomach is bugging her but she declines pain meds.    ADL  Lower Body Dressing: Performed;Supervision/safety Where Assessed - Lower Body Dressing: Unsupported sit to stand      Visit Information  Last OT Received On: 08/30/12 Assistance Needed: +1    Subjective Data  Subjective: "Sometimes I feel real weak and I don't do anything during the day." Patient Stated Goal: None stated.   Prior Functioning     Home Living Lives With: Alone Available Help at Discharge: Personal care attendant;Family;Available PRN/intermittently (Care attendant 3 days/week) Type of Home: Apartment Home Access: Level  entry Home Layout: One level Bathroom Shower/Tub: Engineer, manufacturing systems: Standard Home Adaptive Equipment: Shower chair with back Prior Function Level of Independence: Needs assistance Needs Assistance: Bathing;Light Housekeeping Bath: Moderate Light Housekeeping: Total Able to Take Stairs?: No Driving: No Vocation: Retired Musician: No difficulties Dominant Hand: Right            Cognition  Overall Cognitive Status: History of cognitive impairments - at baseline Arousal/Alertness: Awake/alert Orientation Level: Appears intact for tasks assessed Behavior During Session: [facility] for tasks performed    Extremity/Trunk Assessment Right Upper Extremity Assessment RUE ROM/Strength/Tone: [facility] for tasks assessed Left Upper Extremity Assessment LUE ROM/Strength/Tone: WFL for tasks assessed                 End of Session OT - End of Session Activity Tolerance: Patient tolerated treatment well Patient left: in bed;with call bell/phone within reach;with bed alarm set    Limmie Patricia, OTR/L 08/30/2012, 3:45 PM

## 2012-08-30 NOTE — Progress Notes (Signed)
ANTICOAGULATION CONSULT NOTE   Pharmacy Consult for  warfarin Indication: afib (with occasional PVC's - hx of pacemaker)  Allergies  Allergen Reactions  . Penicillins     REACTION: Unknown   Patient Measurements: Height: 5\' 1"  (154.9 cm) Weight: 132 lb 3.2 oz (59.966 kg) IBW/kg (Calculated) : 47.8   Vital Signs: Temp: 97.9 F (36.6 C) (10/03 0557) Temp src: Oral (10/03 0557) BP: 133/47 mmHg (10/03 0557) Pulse Rate: 89  (10/03 0557)  Labs:  Basename 08/30/12 0523 08/29/12 1702  HGB 11.4* 12.3  HCT 34.9* 37.1  PLT 142* 144*  APTT -- --  LABPROT 14.8 13.9  INR 1.18 1.08  HEPARINUNFRC -- --  CREATININE 0.72 0.93  CKTOTAL -- --  CKMB -- --  TROPONINI -- <0.30   Estimated Creatinine Clearance: 44.3 ml/min (by C-G formula based on Cr of 0.72).  Medical History: Past Medical History  Diagnosis Date  . Dementia   . Pacemaker   . Hypertension   . Acid reflux    Medications:  Scheduled:     . acetaminophen (TYLENOL) oral liquid 160 mg/5 mL  600 mg Oral Once  . antiseptic oral rinse  15 mL Mouth Rinse q12n4p  . chlorhexidine  15 mL Mouth Rinse BID  . ciprofloxacin  400 mg Intravenous Once  . ciprofloxacin  400 mg Intravenous Q12H  . furosemide  20 mg Oral Daily  . metoprolol tartrate  25 mg Oral QPM  . pantoprazole  40 mg Oral Q1200  . potassium chloride SA  20 mEq Oral Daily  . simvastatin  10 mg Oral QPM  . sodium chloride  250 mL Intravenous Once  . Travoprost (BAK Free)  1 drop Right Eye QHS  . traZODone  50 mg Oral QHS  . warfarin  4.5 mg Oral Once  . warfarin  5 mg Oral ONCE-1800  . Warfarin - Pharmacist Dosing Inpatient   Does not apply q1800  . DISCONTD: warfarin  3 mg Oral Daily   PRN: acetaminophen, naphazoline  Assessment:  Patient with routine warfarin schedule of 4.5mg  Mon & Fri / 3mg  on all other days.  Warfarin tx interrupted for oral surgery;  Sub therapeutic INR 78yr old will have delayed response to oral warfarin tx.  Plan one extra "bump"  dose of 5mg  today then anticipate will probably resume previous dosing schedule.  No bleeding problems noted at this time.  Goal of Therapy:  Maintain INR 2-3  Plan:  1.  Warfarin 5mg  today x 1 2.  Daily INR 3.  CBC per protocol  Valrie Hart A 08/30/2012,9:09 AM

## 2012-08-30 NOTE — Progress Notes (Signed)
UR chart review completed.  

## 2012-08-30 NOTE — Progress Notes (Signed)
Subjective: Patient was admitted last night as case of UTI on IV antibiotics. Patient recently become very weak and unable to ambulate.  Objective: Vital signs in last 24 hours: Temp:  [97.6 F (36.4 C)-102.1 F (38.9 C)] 97.9 F (36.6 C) (10/03 0557) Pulse Rate:  [81-91] 89  (10/03 0557) Resp:  [16-20] 16  (10/03 0557) BP: (105-133)/(46-67) 133/47 mmHg (10/03 0557) SpO2:  [94 %-97 %] 97 % (10/03 0557) Weight:  [59.875 kg (132 lb)-59.966 kg (132 lb 3.2 oz)] 59.966 kg (132 lb 3.2 oz) (10/02 2154) Weight change:  Last BM Date: 08/29/12  Intake/Output from previous day: 10/02 0701 - 10/03 0700 In: 120 [I.V.:120] Out: -   PHYSICAL EXAM General appearance: alert and slowed mentation Resp: clear to auscultation bilaterally Cardio: regular rate and rhythm GI: soft, non-tender; bowel sounds normal; no masses,  no organomegaly Extremities: extremities normal, atraumatic, no cyanosis or edema  Lab Results:    @labtest @ [facility] No results found for this basename: PHART,PCO2,PO2ART,TCO2,HCO3 in the last 72 hours CULTURES No results found for this or any previous visit (from the past 240 hour(s)). Studies/Results: Ct Head Wo Contrast  08/29/2012  *RADIOLOGY REPORT*  Clinical Data: Weakness and fatigue  CT HEAD WITHOUT CONTRAST  Technique:  Contiguous axial images were obtained from the base of the skull through the vertex without contrast.  Comparison: 08/04/2011  Findings: There is no evidence for acute hemorrhage, hydrocephalus, mass lesion, or abnormal extra-axial fluid collection.  No definite CT evidence for acute infarction.  Diffuse loss of parenchymal volume is consistent with atrophy. Patchy low attenuation in the deep hemispheric and periventricular white matter is nonspecific, but likely reflects chronic microvascular ischemic demyelination.  The visualized paranasal sinuses and mastoid air cells are clear. The left maxillary sinus is hypoplastic.  IMPRESSION: No acute intracranial  abnormality.  Stable exam.  Atrophy with chronic small vessel white matter disease.   Original Report Authenticated By: ERIC A. MANSELL, M.D.    Dg Chest Port 1 View  08/29/2012  *RADIOLOGY REPORT*  Clinical Data: Generalized body aches.  PORTABLE CHEST - 1 VIEW  Comparison: 03/13/2012  Findings: Right lung is clear.  There is atelectasis or infiltrate at the left base. Interstitial markings are diffusely coarsened with chronic features. The cardiopericardial silhouette is enlarged.  Left-sided dual lead pacer remains in place.  Bones are diffusely demineralized.  IMPRESSION: Cardiomegaly with diffuse chronic interstitial changes in left base atelectasis or infiltrate.   Original Report Authenticated By: ERIC A. MANSELL, M.D.     Medications: I have reviewed the patient's current medications.  Assesment: 1. UTI 2. Atrial fibrillation on anticoagulation 3. Chronic systolic CHF 4. Dementia 5.hypertension 6. S/P pace maker insertion 7. Generalized weaknesss  Active Problems:  * No active hospital problems. *     Plan: 1. Continue IV antibiotics 2. Continue anticoagulation 3. PT consult 4. Supportive care.    LOS: 1 day   [name] 08/30/2012, 7:51 AM

## 2012-08-31 LAB — URINE CULTURE: Colony Count: >=100000

## 2012-08-31 MED ORDER — WARFARIN SODIUM 5 MG PO TABS
5.0000 mg | ORAL_TABLET | Freq: Once | ORAL | Status: AC
  Administered 2012-08-31: 5 mg via ORAL
  Filled 2012-08-31: qty 1

## 2012-08-31 NOTE — Progress Notes (Signed)
ANTICOAGULATION CONSULT NOTE   Pharmacy Consult for  warfarin Indication: afib (with occasional PVC's - hx of pacemaker)  Allergies  Allergen Reactions  . Penicillins     REACTION: Unknown   Patient Measurements: Height: 5\' 1"  (154.9 cm) Weight: 132 lb 3.2 oz (59.966 kg) IBW/kg (Calculated) : 47.8   Vital Signs: Temp: 97.9 F (36.6 C) (10/04 0604) Temp src: Oral (10/04 0604) BP: 116/64 mmHg (10/04 0604) Pulse Rate: 97  (10/04 0604)  Labs:  Basename 08/31/12 4098 08/30/12 0523 08/29/12 1702  HGB -- 11.4* 12.3  HCT -- 34.9* 37.1  PLT -- 142* 144*  APTT -- -- --  LABPROT 15.6* 14.8 13.9  INR 1.27 1.18 1.08  HEPARINUNFRC -- -- --  CREATININE -- 0.72 0.93  CKTOTAL -- -- --  CKMB -- -- --  TROPONINI -- -- <0.30   Estimated Creatinine Clearance: 44.3 ml/min (by C-G formula based on Cr of 0.72).  Medical History: Past Medical History  Diagnosis Date  . Dementia   . Pacemaker   . Hypertension   . Acid reflux    Medications:  Scheduled:     . antiseptic oral rinse  15 mL Mouth Rinse q12n4p  . chlorhexidine  15 mL Mouth Rinse BID  . ciprofloxacin  400 mg Intravenous Q12H  . furosemide  20 mg Oral Daily  . metoprolol tartrate  25 mg Oral QPM  . pantoprazole  40 mg Oral Q1200  . potassium chloride SA  20 mEq Oral Daily  . simvastatin  10 mg Oral QPM  . Travoprost (BAK Free)  1 drop Right Eye QHS  . traZODone  50 mg Oral QHS  . warfarin  5 mg Oral ONCE-1800  . warfarin  5 mg Oral ONCE-1800  . Warfarin - Pharmacist Dosing Inpatient   Does not apply q1800   PRN: acetaminophen, naphazoline  Assessment: Patient with routine warfarin schedule of 4.5mg  Mon & Fri / 3mg  on all other days.  Warfarin tx interrupted for oral surgery;  Sub therapeutic INR, pt on Cipro which can interact with Warfarin and cause increased INR.  No bleeding problems noted at this time.  Goal of Therapy:  Maintain INR 2-3  Plan:  1.  Repeat Warfarin 5mg  today x 1 2.  Daily INR 3.  CBC  per protocol  Valrie Hart A 08/31/2012,8:32 AM

## 2012-08-31 NOTE — Care Management Note (Signed)
    Page 1 of 2   09/03/2012     10:45:29 AM   CARE MANAGEMENT NOTE 09/03/2012  Patient:  [name], [name]   Account Number:  [id number]  Date Initiated:  08/31/2012  Documentation initiated by:  Sharrie Rothman  Subjective/Objective Assessment:   Pt admitted from home with UTI. Pt lives alone and has a PCS/CAP aide 3 times a week. Pt also has a daughter who is very active in the care of the pt. Pt wants to return home at discharge.     Action/Plan:   Pt has an aide with [employer] that come on M-W-F 1-3 to help with bathing and light housework. Pts daughter sees her daily. Pt would need PT and RN at discharge.   Anticipated DC Date:  09/03/2012   Anticipated DC Plan:  HOME W HOME HEALTH SERVICES      DC Planning Services  CM consult      [facility] Choice  HOME HEALTH   Choice offered to / List presented to:  C-4 Adult Children        HH arranged  HH-1 RN  HH-2 PT      [facility] agency  [facility]   Status of service:  Completed, signed off Medicare Important Message given?  YES (If response is "NO", the following Medicare IM given date fields will be blank) Date Medicare IM given:  08/31/2012 Date Additional Medicare IM given:  09/03/2012  Discharge Disposition:  HOME W HOME HEALTH SERVICES  Per UR Regulation:    If discussed at Long Length of Stay Meetings, dates discussed:    Comments:  09/03/12 1043 Arlyss Queen, RN BSN CM Pt discharged home today with [facility] RN and PT. Alroy Bailiff of [facility] is aware and will collect the pts information from the chart. HH services to start within 24-48 hours. No DME needs noted. Pt and pts nurse aware of discharge arrangements.  08/31/12 1521 Arlyss Queen, RN BSN CM

## 2012-08-31 NOTE — Progress Notes (Signed)
Subjective: Patient feels better. She is getting IV anabiotics. No fever or chills.   Objective: Vital signs in last 24 hours: Temp:  [97.9 F (36.6 C)-99.3 F (37.4 C)] 97.9 F (36.6 C) (10/04 0604) Pulse Rate:  [92-97] 97  (10/04 0604) Resp:  [16-18] 16  (10/04 0604) BP: (116-141)/(45-64) 116/64 mmHg (10/04 0604) SpO2:  [95 %-96 %] 95 % (10/04 0604) Weight change:  Last BM Date: 08/29/12  Intake/Output from previous day: 10/03 0701 - 10/04 0700 In: 720 [P.O.:720] Out: -   PHYSICAL EXAM General appearance: alert and slowed mentation Resp: clear to auscultation bilaterally Cardio: regular rate and rhythm GI: soft, non-tender; bowel sounds normal; no masses,  no organomegaly Extremities: extremities normal, atraumatic, no cyanosis or edema  Lab Results:    @labtest @ [facility] No results found for this basename: PHART,PCO2,PO2ART,TCO2,HCO3 in the last 72 hours CULTURES Recent Results (from the past 240 hour(s))  URINE CULTURE     Status: Normal (Preliminary result)   Collection Time   08/29/12  5:28 PM      Component Value Range Status Comment   Specimen Description URINE, CATHETERIZED   Final    Special Requests NONE   Final    Culture  Setup Time 08/30/2012 17:07   Final    Colony Count >=100,000 COLONIES/ML   Final    Culture GRAM NEGATIVE RODS   Final    Report Status PENDING   Incomplete    Studies/Results: Ct Head Wo Contrast  08/29/2012  *RADIOLOGY REPORT*  Clinical Data: Weakness and fatigue  CT HEAD WITHOUT CONTRAST  Technique:  Contiguous axial images were obtained from the base of the skull through the vertex without contrast.  Comparison: 08/04/2011  Findings: There is no evidence for acute hemorrhage, hydrocephalus, mass lesion, or abnormal extra-axial fluid collection.  No definite CT evidence for acute infarction.  Diffuse loss of parenchymal volume is consistent with atrophy. Patchy low attenuation in the deep hemispheric and periventricular white matter is  nonspecific, but likely reflects chronic microvascular ischemic demyelination.  The visualized paranasal sinuses and mastoid air cells are clear. The left maxillary sinus is hypoplastic.  IMPRESSION: No acute intracranial abnormality.  Stable exam.  Atrophy with chronic small vessel white matter disease.   Original Report Authenticated By: ERIC A. MANSELL, M.D.    Dg Chest Port 1 View  08/29/2012  *RADIOLOGY REPORT*  Clinical Data: Generalized body aches.  PORTABLE CHEST - 1 VIEW  Comparison: 03/13/2012  Findings: Right lung is clear.  There is atelectasis or infiltrate at the left base. Interstitial markings are diffusely coarsened with chronic features. The cardiopericardial silhouette is enlarged.  Left-sided dual lead pacer remains in place.  Bones are diffusely demineralized.  IMPRESSION: Cardiomegaly with diffuse chronic interstitial changes in left base atelectasis or infiltrate.   Original Report Authenticated By: ERIC A. MANSELL, M.D.     Medications: I have reviewed the patient's current medications.  Assesment: 1. UTI 2. Atrial fibrillation on anticoagulation 3. Chronic systolic CHF 4. Dementia 5.hypertension 6. S/P pace maker insertion 7. Generalized weaknesss  Active Problems:  * No active hospital problems. *     Plan: Continue current IV antibiotic pending sensitivity result We will follow C/S result Continue anticoagulation.  LOS: 2 days   Henretta Quist 08/31/2012, 7:49 AM

## 2012-09-01 MED ORDER — CIPROFLOXACIN HCL 250 MG PO TABS
250.0000 mg | ORAL_TABLET | Freq: Two times a day (BID) | ORAL | Status: DC
  Administered 2012-09-01 – 2012-09-03 (×5): 250 mg via ORAL
  Filled 2012-09-01 (×5): qty 1

## 2012-09-01 MED ORDER — WARFARIN SODIUM 2 MG PO TABS
3.0000 mg | ORAL_TABLET | Freq: Once | ORAL | Status: AC
  Administered 2012-09-01: 3 mg via ORAL
  Filled 2012-09-01: qty 3

## 2012-09-01 NOTE — Progress Notes (Signed)
Subjective: Patient feeling better. She is growing klebsiella which sensitive for current antibiotics.  Objective: Vital signs in last 24 hours: Temp:  [97.4 F (36.3 C)-98 F (36.7 C)] 97.4 F (36.3 C) (10/05 0500) Pulse Rate:  [88-105] 105  (10/05 0500) Resp:  [18] 18  (10/05 0500) BP: (101-144)/(58-85) 142/76 mmHg (10/05 0500) SpO2:  [97 %-98 %] 98 % (10/05 0500) Weight change:  Last BM Date: 08/31/12  Intake/Output from previous day: 10/04 0701 - 10/05 0700 In: 1871 [P.O.:100; I.V.:971; IV Piggyback:800] Out: -   PHYSICAL EXAM General appearance: alert and slowed mentation Resp: clear to auscultation bilaterally Cardio: regular rate and rhythm GI: soft, non-tender; bowel sounds normal; no masses,  no organomegaly Extremities: extremities normal, atraumatic, no cyanosis or edema  Lab Results:    @labtest @ [facility] No results found for this basename: PHART,PCO2,PO2ART,TCO2,HCO3 in the last 72 hours CULTURES Recent Results (from the past 240 hour(s))  URINE CULTURE     Status: Normal   Collection Time   08/29/12  5:28 PM      Component Value Range Status Comment   Specimen Description URINE, CATHETERIZED   Final    Special Requests NONE   Final    Culture  Setup Time 08/30/2012 17:07   Final    Colony Count >=100,000 COLONIES/ML   Final    Culture KLEBSIELLA PNEUMONIAE   Final    Report Status 08/31/2012 FINAL   Final    Organism ID, Bacteria KLEBSIELLA PNEUMONIAE   Final    Studies/Results: No results found.  Medications: I have reviewed the patient's current medications.  Assesment: 1. UTI 2. Atrial fibrillation on anticoagulation 3. Chronic systolic CHF 4. Dementia 5.hypertension 6. S/P pace maker insertion 7. Generalized weaknesss  Active Problems:  * No active hospital problems. *     Plan: Continue current antibiotics Continue anticoagulation. Continue supportive care  LOS: 3 days   Syd Newsome 09/01/2012, 12:24 PM

## 2012-09-01 NOTE — Progress Notes (Signed)
ANTICOAGULATION CONSULT NOTE   Pharmacy Consult for  warfarin Indication: afib (with occasional PVC's - hx of pacemaker)  Allergies  Allergen Reactions  . Penicillins     REACTION: Unknown   Patient Measurements: Height: 5\' 1"  (154.9 cm) Weight: 132 lb 3.2 oz (59.966 kg) IBW/kg (Calculated) : 47.8   Vital Signs: Temp: 97.4 F (36.3 C) (10/05 0500) Temp src: Oral (10/05 0500) BP: 142/76 mmHg (10/05 0500) Pulse Rate: 105  (10/05 0500)  Labs:  Basename 09/01/12 0437 08/31/12 0611 08/30/12 0523 08/29/12 1702  HGB -- -- 11.4* 12.3  HCT -- -- 34.9* 37.1  PLT -- -- 142* 144*  APTT -- -- -- --  LABPROT 18.5* 15.6* 14.8 --  INR 1.59* 1.27 1.18 --  HEPARINUNFRC -- -- -- --  CREATININE -- -- 0.72 0.93  CKTOTAL -- -- -- --  CKMB -- -- -- --  TROPONINI -- -- -- <0.30   Estimated Creatinine Clearance: 44.3 ml/min (by C-G formula based on Cr of 0.72).  Medical History: Past Medical History  Diagnosis Date  . Dementia   . Pacemaker   . Hypertension   . Acid reflux    Medications:  Scheduled:     . antiseptic oral rinse  15 mL Mouth Rinse q12n4p  . chlorhexidine  15 mL Mouth Rinse BID  . ciprofloxacin  400 mg Intravenous Q12H  . furosemide  20 mg Oral Daily  . metoprolol tartrate  25 mg Oral QPM  . pantoprazole  40 mg Oral Q1200  . potassium chloride SA  20 mEq Oral Daily  . simvastatin  10 mg Oral QPM  . Travoprost (BAK Free)  1 drop Right Eye QHS  . traZODone  50 mg Oral QHS  . warfarin  5 mg Oral ONCE-1800  . Warfarin - Pharmacist Dosing Inpatient   Does not apply q1800   PRN: acetaminophen, naphazoline  Assessment: INR large increase 1.27 to 1.59 Patient with routine warfarin schedule of 4.5mg  Mon & Fri / 3mg  on all other days.  Warfarin tx interrupted for oral surgery;  Sub therapeutic INR, pt on Cipro which can interact with Warfarin and cause increased INR.  No bleeding problems noted at this time.  Goal of Therapy:  Maintain INR 2-3  Plan:  1.   Warfarin 3 mg today x 1 2.  Daily INR 3.  CBC per protocol  [name], [name] 09/01/2012,10:01 AM

## 2012-09-02 LAB — PROTIME-INR
INR: 1.84 — ABNORMAL HIGH (ref 0.00–1.49)
Prothrombin Time: 20.6 seconds — ABNORMAL HIGH (ref 11.6–15.2)

## 2012-09-02 MED ORDER — WARFARIN SODIUM 2 MG PO TABS
3.0000 mg | ORAL_TABLET | Freq: Once | ORAL | Status: AC
  Administered 2012-09-02: 3 mg via ORAL
  Filled 2012-09-02: qty 1

## 2012-09-02 NOTE — Progress Notes (Signed)
Subjective: Patient is improving. Her antibiotics changed to po. Her coumadin is being adjusted. Objective: Vital signs in last 24 hours: Temp:  [97.6 F (36.4 C)-97.9 F (36.6 C)] 97.6 F (36.4 C) (10/05 2157) Pulse Rate:  [90-112] 90  (10/05 2157) Resp:  [18] 18  (10/05 2157) BP: (100-126)/(57-70) 126/57 mmHg (10/05 2157) SpO2:  [95 %-96 %] 96 % (10/05 2157) Weight change:  Last BM Date: 08/31/12  Intake/Output from previous day: 10/05 0701 - 10/06 0700 In: 100 [P.O.:100] Out: -   PHYSICAL EXAM General appearance: alert and slowed mentation Resp: clear to auscultation bilaterally Cardio: regular rate and rhythm GI: soft, non-tender; bowel sounds normal; no masses,  no organomegaly Extremities: extremities normal, atraumatic, no cyanosis or edema  Lab Results:    @labtest @ [facility] No results found for this basename: PHART,PCO2,PO2ART,TCO2,HCO3 in the last 72 hours CULTURES Recent Results (from the past 240 hour(s))  URINE CULTURE     Status: Normal   Collection Time   08/29/12  5:28 PM      Component Value Range Status Comment   Specimen Description URINE, CATHETERIZED   Final    Special Requests NONE   Final    Culture  Setup Time 08/30/2012 17:07   Final    Colony Count >=100,000 COLONIES/ML   Final    Culture KLEBSIELLA PNEUMONIAE   Final    Report Status 08/31/2012 FINAL   Final    Organism ID, Bacteria KLEBSIELLA PNEUMONIAE   Final    Studies/Results: No results found.  Medications: I have reviewed the patient's current medications.  Assesment: 1. UTI 2. Atrial fibrillation on anticoagulation 3. Chronic systolic CHF 4. Dementia 5.hypertension 6. S/P pace maker insertion 7. Generalized weaknesss  Active Problems:  * No active hospital problems. *     Plan: Continue po cipro Continue anticoagulation and adjust according to PT/INR Continue supportive care  LOS: 4 days   Daylin Eads 09/02/2012, 10:46 AM

## 2012-09-02 NOTE — Progress Notes (Signed)
ANTICOAGULATION CONSULT NOTE   Pharmacy Consult for  warfarin Indication: afib (with occasional PVC's - hx of pacemaker)  Allergies  Allergen Reactions  . Penicillins     REACTION: Unknown   Patient Measurements: Height: 5\' 1"  (154.9 cm) Weight: 132 lb 3.2 oz (59.966 kg) IBW/kg (Calculated) : 47.8   Vital Signs: Temp: 97.6 F (36.4 C) (10/05 2157) Temp src: Oral (10/05 2157) BP: 126/57 mmHg (10/05 2157) Pulse Rate: 90  (10/05 2157)  Labs:  Alvira Philips 09/02/12 0456 09/01/12 0437 08/31/12 0611  HGB -- -- --  HCT -- -- --  PLT -- -- --  APTT -- -- --  LABPROT 20.6* 18.5* 15.6*  INR 1.84* 1.59* 1.27  HEPARINUNFRC -- -- --  CREATININE -- -- --  CKTOTAL -- -- --  CKMB -- -- --  TROPONINI -- -- --   Estimated Creatinine Clearance: 44.3 ml/min (by C-G formula based on Cr of 0.72).  Medical History: Past Medical History  Diagnosis Date  . Dementia   . Pacemaker   . Hypertension   . Acid reflux    Medications:  Scheduled:     . antiseptic oral rinse  15 mL Mouth Rinse q12n4p  . chlorhexidine  15 mL Mouth Rinse BID  . ciprofloxacin  250 mg Oral BID  . furosemide  20 mg Oral Daily  . metoprolol tartrate  25 mg Oral QPM  . pantoprazole  40 mg Oral Q1200  . potassium chloride SA  20 mEq Oral Daily  . simvastatin  10 mg Oral QPM  . Travoprost (BAK Free)  1 drop Right Eye QHS  . traZODone  50 mg Oral QHS  . warfarin  3 mg Oral ONCE-1800  . Warfarin - Pharmacist Dosing Inpatient   Does not apply q1800  . DISCONTD: ciprofloxacin  400 mg Intravenous Q12H   PRN: acetaminophen, naphazoline  Assessment: INR 1.84, trending upward Patient with routine warfarin schedule of 4.5mg  Mon & Fri / 3mg  on all other days.  Warfarin tx interrupted for oral surgery;  Sub therapeutic INR, pt on Cipro which can interact with Warfarin and cause increased INR.  No bleeding problems noted at this time.  Goal of Therapy:  Maintain INR 2-3  Plan:  1.  Warfarin 3 mg today x 1 (home  dose) 2.  Daily INR 3.  CBC per protocol  [name], Rozena Fierro Bennett 09/02/2012,9:00 AM

## 2012-09-03 LAB — PROTIME-INR: INR: 1.87 — ABNORMAL HIGH (ref 0.00–1.49)

## 2012-09-03 NOTE — Discharge Summary (Signed)
Physician Discharge Summary  Patient ID: [name] MRN: [id number] DOB/AGE: 01-Jun-1927 78 y.o. Primary Care Physician:Morrie Daywalt, MD Admit date: 08/29/2012 Discharge date: 09/03/2012    Discharge Diagnoses:  1. UTI (Klebesellia Pneumoniae)  2. Atrial fibrillation on anticoagulation  3. Chronic systolic CHF  4. Dementia  5.hypertension  6. S/P pace maker insertion  7. Generalized weaknesss  Active Problems:  * No active hospital problems. *      Medication List     As of 09/03/2012  8:13 AM    TAKE these medications         acetaminophen 500 MG tablet   Commonly known as: TYLENOL   Take 500 mg by mouth once as needed. For post dental procedure pain      ALLERGY MEDICATION 25 mg capsule   Generic drug: diphenhydrAMINE   Take 25 mg by mouth daily as needed. For allergies      CENTRUM SILVER PO   Take 1 tablet by mouth daily.      clotrimazole-betamethasone cream   Commonly known as: LOTRISONE   Apply 1 application topically 2 (two) times daily.      EYE DROPS 0.05 % ophthalmic solution   Generic drug: tetrahydrozoline   Place 1 drop into both eyes daily as needed. For dry eye relief      furosemide 20 MG tablet   Commonly known as: LASIX   Take 20 mg by mouth daily.      metoprolol tartrate 25 MG tablet   Commonly known as: LOPRESSOR   Take 25 mg by mouth every evening.      omeprazole 20 MG capsule   Commonly known as: PRILOSEC   Take 20 mg by mouth daily.      potassium chloride SA 20 MEQ tablet   Commonly known as: K-DUR,KLOR-CON   Take 20 mEq by mouth daily.      simvastatin 20 MG tablet   Commonly known as: ZOCOR   Take 10 mg by mouth every evening.      Travoprost (BAK Free) 0.004 % Soln ophthalmic solution   Commonly known as: TRAVATAN   Place 1 drop into the right eye at bedtime.      traZODone 50 MG tablet   Commonly known as: DESYREL   Take 50 mg by mouth at bedtime.      warfarin 3 MG tablet   Commonly known as: COUMADIN   Take 3 mg by mouth daily. *Take one tablet by mouth every evening. Take one & one-half tablet (4.5mg  total) on Mondays and Fridays only*        Discharged Condition:*stable   Consults:*none Significant Diagnostic Studies: Ct Head Wo Contrast  08/29/2012  *RADIOLOGY REPORT*  Clinical Data: Weakness and fatigue  CT HEAD WITHOUT CONTRAST  Technique:  Contiguous axial images were obtained from the base of the skull through the vertex without contrast.  Comparison: 08/04/2011  Findings: There is no evidence for acute hemorrhage, hydrocephalus, mass lesion, or abnormal extra-axial fluid collection.  No definite CT evidence for acute infarction.  Diffuse loss of parenchymal volume is consistent with atrophy. Patchy low attenuation in the deep hemispheric and periventricular white matter is nonspecific, but likely reflects chronic microvascular ischemic demyelination.  The visualized paranasal sinuses and mastoid air cells are clear. The left maxillary sinus is hypoplastic.  IMPRESSION: No acute intracranial abnormality.  Stable exam.  Atrophy with chronic small vessel white matter disease.   Original Report Authenticated By: ERIC A. MANSELL, M.D.  Dg Chest Port 1 View  08/29/2012  *RADIOLOGY REPORT*  Clinical Data: Generalized body aches.  PORTABLE CHEST - 1 VIEW  Comparison: 03/13/2012  Findings: Right lung is clear.  There is atelectasis or infiltrate at the left base. Interstitial markings are diffusely coarsened with chronic features. The cardiopericardial silhouette is enlarged.  Left-sided dual lead pacer remains in place.  Bones are diffusely demineralized.  IMPRESSION: Cardiomegaly with diffuse chronic interstitial changes in left base atelectasis or infiltrate.   Original Report Authenticated By: ERIC A. MANSELL, M.D.     Lab Results: Basic Metabolic Panel: No results found for this basename: NA:2,K:2,CL:2,CO2:2,GLUCOSE:2,BUN:2,CREATININE:2,CALCIUM:2,MG:2,PHOS:2 in the last 72 hours Liver  Function Tests: No results found for this basename: AST:2,ALT:2,ALKPHOS:2,BILITOT:2,PROT:2,ALBUMIN:2 in the last 72 hours   CBC: No results found for this basename: WBC:2,NEUTROABS:2,HGB:2,HCT:2,MCV:2,PLT:2 in the last 72 hours  Recent Results (from the past 240 hour(s))  URINE CULTURE     Status: Normal   Collection Time   08/29/12  5:28 PM      Component Value Range Status Comment   Specimen Description URINE, CATHETERIZED   Final    Special Requests NONE   Final    Culture  Setup Time 08/30/2012 17:07   Final    Colony Count >=100,000 COLONIES/ML   Final    Culture KLEBSIELLA PNEUMONIAE   Final    Report Status 08/31/2012 FINAL   Final    Organism ID, Bacteria KLEBSIELLA PNEUMONIAE   Final      Hospital Course: * This is an 78 years old female patient with history of multiple medical illness who was admitted due change in states and body ache. She was found to have UTI. She was started on empirical IV antibiotics. Later she grow Klebsiella Pneumoniae.  Patient completed 5 days course of antibiotics. She has improved but remained weak and deconditioned. She will discharged home and home health is arranged. Discharge Exam: Blood pressure 121/64, pulse 64, temperature 98.1 F (36.7 C), temperature source Oral, resp. rate 20, height 5\' 1"  (1.549 m), weight 59.966 kg (132 lb 3.2 oz), SpO2 97.00%. * Disposition: *home       Follow-up Information    Follow up with Carmencita Cusic, MD. In 1 week.   Contact information:   [address] [place] [address] [phone]          Signed: Tashena Ibach  09/03/2012, 8:13 AM

## 2012-12-11 ENCOUNTER — Ambulatory Visit (HOSPITAL_COMMUNITY)
Admission: RE | Admit: 2012-12-11 | Discharge: 2012-12-11 | Disposition: A | Discharge status: Home / Self Care | Payer: Medicare Other | Source: Ambulatory Visit | Attending: Internal Medicine | Admitting: Internal Medicine

## 2012-12-11 DIAGNOSIS — I059 Rheumatic mitral valve disease, unspecified: Secondary | ICD-10-CM | POA: Insufficient documentation

## 2012-12-11 NOTE — Progress Notes (Signed)
*  PRELIMINARY RESULTS* Echocardiogram 2D Echocardiogram has been performed.  [name] 12/11/2012, 11:57 AM

## 2013-03-15 ENCOUNTER — Encounter: Payer: Self-pay | Admitting: Pharmacist Clinician (PhC)/ Clinical Pharmacy Specialist

## 2013-03-15 DIAGNOSIS — Z7901 Long term (current) use of anticoagulants: Secondary | ICD-10-CM | POA: Insufficient documentation

## 2013-03-15 DIAGNOSIS — I4891 Unspecified atrial fibrillation: Secondary | ICD-10-CM

## 2013-04-24 ENCOUNTER — Ambulatory Visit (INDEPENDENT_AMBULATORY_CARE_PROVIDER_SITE_OTHER): Payer: Medicare Other | Admitting: Pharmacist Clinician (PhC)/ Clinical Pharmacy Specialist

## 2013-04-24 DIAGNOSIS — Z7901 Long term (current) use of anticoagulants: Secondary | ICD-10-CM

## 2013-04-24 DIAGNOSIS — I4891 Unspecified atrial fibrillation: Secondary | ICD-10-CM

## 2013-05-01 NOTE — Progress Notes (Signed)
Needs return date and provider

## 2013-05-02 ENCOUNTER — Ambulatory Visit: Payer: Medicare Other

## 2013-05-03 ENCOUNTER — Ambulatory Visit (INDEPENDENT_AMBULATORY_CARE_PROVIDER_SITE_OTHER): Payer: Medicare Other | Admitting: Urology

## 2013-05-03 DIAGNOSIS — L89309 Pressure ulcer of unspecified buttock, unspecified stage: Secondary | ICD-10-CM

## 2013-05-03 DIAGNOSIS — N3020 Other chronic cystitis without hematuria: Secondary | ICD-10-CM

## 2013-05-03 DIAGNOSIS — R310 Gross hematuria: Secondary | ICD-10-CM

## 2013-05-16 ENCOUNTER — Telehealth: Payer: Self-pay | Admitting: Pharmacist Clinician (PhC)/ Clinical Pharmacy Specialist

## 2013-05-16 DIAGNOSIS — I4891 Unspecified atrial fibrillation: Secondary | ICD-10-CM

## 2013-05-16 DIAGNOSIS — Z7901 Long term (current) use of anticoagulants: Secondary | ICD-10-CM

## 2013-05-16 NOTE — Telephone Encounter (Signed)
Pt was put on cipro bid x 7 days by urologist, was told to stop warfarin while on abx.  I spoke with pt dtr today, asked her to restart today (day 6?) at 6mg  x 2 then resume 3mg  qd x 1.5mg  MF.  She is to go to the [facility] lab on Wed June 26 for INR check.  Dtr states that after last dose of cipro, pt will be on prophylactic cephalexin qhs.  Will mail lab order to dtr and call her with results

## 2013-05-23 ENCOUNTER — Ambulatory Visit (INDEPENDENT_AMBULATORY_CARE_PROVIDER_SITE_OTHER): Payer: Self-pay | Admitting: Pharmacist Clinician (PhC)/ Clinical Pharmacy Specialist

## 2013-05-23 DIAGNOSIS — I4891 Unspecified atrial fibrillation: Secondary | ICD-10-CM

## 2013-05-23 DIAGNOSIS — Z7901 Long term (current) use of anticoagulants: Secondary | ICD-10-CM

## 2013-06-21 ENCOUNTER — Ambulatory Visit (INDEPENDENT_AMBULATORY_CARE_PROVIDER_SITE_OTHER): Payer: Medicare Other | Admitting: Urology

## 2013-06-21 DIAGNOSIS — N3020 Other chronic cystitis without hematuria: Secondary | ICD-10-CM

## 2013-06-21 DIAGNOSIS — N3941 Urge incontinence: Secondary | ICD-10-CM

## 2013-06-21 DIAGNOSIS — R310 Gross hematuria: Secondary | ICD-10-CM

## 2013-07-02 ENCOUNTER — Encounter: Payer: Self-pay | Admitting: Cardiovascular Disease

## 2013-07-02 ENCOUNTER — Other Ambulatory Visit: Payer: Self-pay | Admitting: *Deleted

## 2013-07-02 ENCOUNTER — Other Ambulatory Visit: Payer: Self-pay | Admitting: Cardiovascular Disease

## 2013-07-02 DIAGNOSIS — Z01812 Encounter for preprocedural laboratory examination: Secondary | ICD-10-CM

## 2013-07-02 LAB — CBC WITH DIFFERENTIAL/PLATELET
Eosinophils Relative: 3 % (ref 0–5)
HCT: 39.5 % (ref 36.0–46.0)
Hemoglobin: 12.9 g/dL (ref 12.0–15.0)
Lymphocytes Relative: 33 % (ref 12–46)
Lymphs Abs: 1.8 10*3/uL (ref 0.7–4.0)
MCV: 89.4 fL (ref 78.0–100.0)
Monocytes Absolute: 0.5 10*3/uL (ref 0.1–1.0)
Monocytes Relative: 9 % (ref 3–12)
RDW: 14.5 % (ref 11.5–15.5)
WBC: 5.6 10*3/uL (ref 4.0–10.5)

## 2013-07-02 LAB — PROTIME-INR: Prothrombin Time: 25 seconds — ABNORMAL HIGH (ref 11.6–15.2)

## 2013-07-02 LAB — COMPREHENSIVE METABOLIC PANEL
Alkaline Phosphatase: 62 U/L (ref 39–117)
BUN: 21 mg/dL (ref 6–23)
CO2: 25 mEq/L (ref 19–32)
Glucose, Bld: 89 mg/dL (ref 70–99)
Total Bilirubin: 0.6 mg/dL (ref 0.3–1.2)

## 2013-07-02 LAB — TSH: TSH: 1.611 u[IU]/mL (ref 0.350–4.500)

## 2013-07-03 ENCOUNTER — Encounter (HOSPITAL_COMMUNITY): Payer: Self-pay | Admitting: Pharmacy Technician

## 2013-07-03 LAB — URINALYSIS, ROUTINE W REFLEX MICROSCOPIC
Glucose, UA: NEGATIVE mg/dL
Leukocytes, UA: NEGATIVE
Nitrite: NEGATIVE
Protein, ur: 100 mg/dL — AB
pH: 7 (ref 5.0–8.0)

## 2013-07-03 LAB — URINALYSIS, MICROSCOPIC ONLY
Casts: NONE SEEN
Crystals: NONE SEEN

## 2013-07-04 MED ORDER — GENTAMICIN SULFATE 40 MG/ML IJ SOLN
80.0000 mg | INTRAMUSCULAR | Status: DC
  Filled 2013-07-04 (×2): qty 2

## 2013-07-05 ENCOUNTER — Ambulatory Visit (HOSPITAL_COMMUNITY): Payer: Medicare Other

## 2013-07-05 ENCOUNTER — Encounter (HOSPITAL_COMMUNITY)
Admission: RE | Disposition: A | Discharge status: Home / Self Care | Payer: Self-pay | Source: Ambulatory Visit | Attending: Cardiovascular Disease

## 2013-07-05 ENCOUNTER — Ambulatory Visit (HOSPITAL_COMMUNITY)
Admission: RE | Admit: 2013-07-05 | Discharge: 2013-07-05 | Disposition: A | Discharge status: Home / Self Care | Payer: Medicare Other | Source: Ambulatory Visit | Attending: Cardiovascular Disease | Admitting: Cardiovascular Disease

## 2013-07-05 DIAGNOSIS — Z79899 Other long term (current) drug therapy: Secondary | ICD-10-CM | POA: Insufficient documentation

## 2013-07-05 DIAGNOSIS — Z7901 Long term (current) use of anticoagulants: Secondary | ICD-10-CM | POA: Insufficient documentation

## 2013-07-05 DIAGNOSIS — I495 Sick sinus syndrome: Secondary | ICD-10-CM | POA: Insufficient documentation

## 2013-07-05 DIAGNOSIS — Z01812 Encounter for preprocedural laboratory examination: Secondary | ICD-10-CM

## 2013-07-05 DIAGNOSIS — I4891 Unspecified atrial fibrillation: Secondary | ICD-10-CM

## 2013-07-05 DIAGNOSIS — Z45018 Encounter for adjustment and management of other part of cardiac pacemaker: Secondary | ICD-10-CM

## 2013-07-05 DIAGNOSIS — Z950 Presence of cardiac pacemaker: Secondary | ICD-10-CM

## 2013-07-05 DIAGNOSIS — R001 Bradycardia, unspecified: Secondary | ICD-10-CM

## 2013-07-05 DIAGNOSIS — I2510 Atherosclerotic heart disease of native coronary artery without angina pectoris: Secondary | ICD-10-CM | POA: Insufficient documentation

## 2013-07-05 DIAGNOSIS — I498 Other specified cardiac arrhythmias: Secondary | ICD-10-CM

## 2013-07-05 HISTORY — PX: PACEMAKER GENERATOR CHANGE: SHX5481

## 2013-07-05 LAB — BASIC METABOLIC PANEL
BUN: 18 mg/dL (ref 6–23)
CO2: 26 mEq/L (ref 19–32)
Calcium: 9.4 mg/dL (ref 8.4–10.5)
Chloride: 104 mEq/L (ref 96–112)
Creatinine, Ser: 0.89 mg/dL (ref 0.50–1.10)
GFR calc Af Amer: 67 mL/min — ABNORMAL LOW (ref >90)

## 2013-07-05 LAB — PROTIME-INR
INR: 1.11 (ref 0.00–1.49)
Prothrombin Time: 14.1 seconds (ref 11.6–15.2)

## 2013-07-05 LAB — SURGICAL PCR SCREEN: MRSA, PCR: NEGATIVE

## 2013-07-05 SURGERY — PACEMAKER GENERATOR CHANGE
Anesthesia: LOCAL

## 2013-07-05 MED ORDER — MUPIROCIN 2 % EX OINT
TOPICAL_OINTMENT | CUTANEOUS | Status: AC
  Filled 2013-07-05: qty 22

## 2013-07-05 MED ORDER — SODIUM CHLORIDE 0.9 % IV SOLN: INTRAVENOUS | Status: AC

## 2013-07-05 MED ORDER — ACETAMINOPHEN 325 MG PO TABS: 325.0000 mg | ORAL_TABLET | ORAL | Status: DC | PRN

## 2013-07-05 MED ORDER — VANCOMYCIN HCL IN DEXTROSE 1-5 GM/200ML-% IV SOLN
1000.0000 mg | Freq: Once | INTRAVENOUS | Status: DC
  Filled 2013-07-05: qty 200

## 2013-07-05 MED ORDER — LIDOCAINE HCL (PF) 1 % IJ SOLN
INTRAMUSCULAR | Status: AC
  Filled 2013-07-05: qty 30

## 2013-07-05 MED ORDER — ONDANSETRON HCL 4 MG/2ML IJ SOLN: 4.0000 mg | Freq: Four times a day (QID) | INTRAMUSCULAR | Status: DC | PRN

## 2013-07-05 MED ORDER — MIDAZOLAM HCL 5 MG/5ML IJ SOLN
INTRAMUSCULAR | Status: AC
  Filled 2013-07-05: qty 5

## 2013-07-05 MED ORDER — CEFAZOLIN SODIUM-DEXTROSE 2-3 GM-% IV SOLR: 2.0000 g | INTRAVENOUS | Status: DC

## 2013-07-05 MED ORDER — SODIUM CHLORIDE 0.9 % IV SOLN
INTRAVENOUS | Status: DC
  Administered 2013-07-05: 08:00:00 via INTRAVENOUS

## 2013-07-05 MED ORDER — MUPIROCIN 2 % EX OINT
TOPICAL_OINTMENT | Freq: Two times a day (BID) | CUTANEOUS | Status: DC
  Administered 2013-07-05: 1 via NASAL
  Filled 2013-07-05: qty 22

## 2013-07-05 NOTE — H&P (Signed)
Date of Initial H&P: 07/04/2013, Dr. Alanda Amass  History reviewed, patient examined, no change in status, stable for surgery. Here for pacemaker generator change for AF with slow ventricular response with symptomatic bradycardia. Has a dual chamber pacemaker (programmed VVIR) that has reached ERI >3 months ago). Good lead parameters.  This procedure has been fully reviewed with the patient and written informed consent has been obtained. Norma Fair, MD, [facility] [phone] office [phone] pager

## 2013-07-05 NOTE — Op Note (Signed)
Procedure report  Procedure performed:  1. Dual chamber pacemaker generator changeout  2. Light sedation  Reason for procedure:  1. Device generator at elective replacement interval  2. Symptomatic bradycardia, Atrial fibrillation with slow ventricular response Procedure performed by:  Thurmon Fair, MD  Complications:  None  Estimated blood loss:  <5 mL  Medications administered during procedure:  Vancomycin 1 g intravenously, lidocaine 1% 30 mL locally,  Versed 0.5 mg intravenously Device details:   New Generator Medtronic Alverda model number [id number], serial number [id number] Right atrial lead (chronic) Medtronic, model number 5594, serial [id number] (implanted 09/19/2006) Right ventricular lead (chronic)  Medtronic, model number [id number], serial number [id number] (implanted 09/19/2006)  Explanted generator Medtronic EnRhythm,  model number [id number], serial number  [id number] (implanted 09/19/2006)  Procedure details:  After the risks and benefits of the procedure were discussed the patient provided informed consent. She was brought to the cardiac catheter lab in the fasting state. The patient was prepped and draped in usual sterile fashion. Local anesthesia with 1% lidocaine was administered to to the left infraclavicular area. A 5-6cm horizontal incision was made parallel with and 2-3 cm caudal to the left clavicle, in the area of an old scar. An older scar was seen closer to the left clavicle. Using minimal electrocautery and mostly sharp and blunt dissection the prepectoral pocket was opened carefully to avoid injury to the loops of chronic leads. Extensive dissection was not necessary. The device was explanted. The pocket was carefully inspected for hemostasis and flushed with copious amounts of antibiotic solution.  The leads were disconnected from the old generator and testing of the lead parameters later showed excellent values. The new generator was connected to the chronic  leads, with appropriate pacing noted.   The entire system was then carefully inserted in the pocket with care been taking that the leads and device assumed a comfortable position without pressure on the incision. Great care was taken that the leads be located deep to the generator. The pocket was then closed in layers using 2 layers of 2-0 Vicryl and cutaneous staples after which a sterile dressing was applied.   At the end of the procedure the following lead parameters were encountered:   Right atrial lead sensed P waves 0.8 mV (atrial fibrillation) Right ventricular lead sensed R waves  16 mV, impedance 709 ohms, threshold 1.3 at 0.5 ms pulse width.   Thurmon Fair, MD, [facility] [phone] office [phone] pager

## 2013-07-09 ENCOUNTER — Telehealth: Payer: Self-pay | Admitting: Cardiovascular Disease

## 2013-07-09 NOTE — Telephone Encounter (Signed)
Ankle and feet swelling  Can she have a fluid pill  Has some soreness around pacer site  Had batteries put in pacemaker on friday

## 2013-07-09 NOTE — Telephone Encounter (Signed)
Message forwarded to [facility]. [name], LPN to discuss w/ Dr. Alanda Amass.  This note and paper chart# [id number] placed on Dr. Kandis Cocking cart.

## 2013-07-09 NOTE — Telephone Encounter (Signed)
Per JC, LPN, Dr. Alanda Amass stated pt can take 20 mg Furosemide.  Call back to [facility] and informed.  Also advised pt keep appt on Thursday w/ Dr. Alanda Amass.  Verbalized understanding and agreed w/ plan.

## 2013-07-09 NOTE — Telephone Encounter (Signed)
Returned call and spoke w/ [name], pt's daughter.  Stated pt had batteries put in pacemaker on Friday and has an appt w/ Dr. Alanda Amass on Thursday.  Stated pt has some swelling in her feet and ankles and she wanted to know if she can give pt her fluid pill.  Stated pt was taken off the fluid pill, but still has some.  Asked name of med and daughter stated med is furosemide 20 mg and potassium.  Asked if med is expired and informed it doesn't expire until June 2015.  Stated pt also c/o soreness at pacemaker site.  Stated they took the band-aids off Sunday and it is bruised a little.  Denied drainage from site.  Denied CP or SOB.  Stated pt did miss three pills last night: metoprolol, cephalexin and trazadone.  Advised pt take missed abx now and resume regular schedule.  Advised pt take other meds as scheduled tonight and informed Dr. Alanda Amass will be notified in regards to pt taking a fluid pill.  Verbalized understanding and agreed w/ plan.  Message forwarded to [facility]. [name], LPN to discuss w/ Dr. Alanda Amass.  This note and paper chart# [id number] placed on Dr. Kandis Cocking cart.

## 2013-07-09 NOTE — Telephone Encounter (Signed)
Patient daug [name] calling  [name] had batteries pacemaker put in on Friday .  Ankles and feet swelling  Can she give her a fluid pill.  Also little sore around pacer site.

## 2013-07-11 LAB — PACEMAKER DEVICE OBSERVATION

## 2013-07-16 ENCOUNTER — Other Ambulatory Visit: Payer: Self-pay | Admitting: Cardiovascular Disease

## 2013-07-16 LAB — PROTIME-INR: Prothrombin Time: 19.3 seconds — ABNORMAL HIGH (ref 11.6–15.2)

## 2013-07-17 ENCOUNTER — Ambulatory Visit (INDEPENDENT_AMBULATORY_CARE_PROVIDER_SITE_OTHER): Payer: Self-pay | Admitting: Pharmacist Clinician (PhC)/ Clinical Pharmacy Specialist

## 2013-07-17 DIAGNOSIS — Z7901 Long term (current) use of anticoagulants: Secondary | ICD-10-CM

## 2013-07-17 DIAGNOSIS — I4891 Unspecified atrial fibrillation: Secondary | ICD-10-CM

## 2013-07-23 ENCOUNTER — Telehealth: Payer: Self-pay | Admitting: Cardiovascular Disease

## 2013-07-23 NOTE — Telephone Encounter (Signed)
[name] is calling about some swelling in her moms (L) foot have given her some fluid pills and there is still swelling and is not sure what else can she do .Marland Kitchen Please Call   Thanks

## 2013-07-23 NOTE — Telephone Encounter (Signed)
Returned call to [name], pt's daughter.  Stated pt had batteries changed in pacemaker a few weeks ago.  Stated her feet started swelling about 4-5 days after procedure.  Stated now L foot is still swollen.  Stated she started giving her a fluid pill and it's still swollen.  Asked daughter name of fluid pill as no fluid pill on current med list.  Stated pt is taking furosemide 20 mg daily.  Also stated pt is on abx: cephalexin 250 mg x 1 mo for UTI and pt c/o having trouble urinating.  Stated she did give pt one K+ pill since restarting fluid pill about 5-6 days ago.   Denied c/o CP or SOB.  No pain in feet now, but c/o intermittent pain.  Denied discoloration in feet and pt c/o intermittent discoloration in L foot. Daughter informed Dr. Alanda Amass will be notified for further instructions.  Verbalized understanding.  Message forwarded to [facility]. [name], LPN to discuss w/ Dr. Alanda Amass.  This note and paper chart# [id number] ([place]) placed on Dr. Kandis Cocking cart w/ last OV note.

## 2013-07-24 NOTE — Telephone Encounter (Signed)
Talked to pt.s daughter and told her i would call her in the morning when i and dr. Alanda Amass will be in [place]. Pts. Daughter stated understanding

## 2013-08-01 NOTE — Telephone Encounter (Signed)
Still taking her fluid pill-but her left foot is still swelling.She also need her Coumadin drawn tomorrow-needs an order for that-She get this at [facility] in [place].Does she still need to get her shinkle shot?

## 2013-08-01 NOTE — Telephone Encounter (Signed)
JC, LPN notified and stated he will call pt.  Last OV note given ([place] pt).  Message forwarded to [facility]. [name], LPN.

## 2013-08-06 ENCOUNTER — Encounter: Payer: Self-pay | Admitting: Cardiovascular Disease

## 2013-08-08 ENCOUNTER — Encounter: Payer: Self-pay | Admitting: Cardiovascular Disease

## 2013-08-08 ENCOUNTER — Other Ambulatory Visit: Payer: Self-pay | Admitting: Cardiovascular Disease

## 2013-08-08 LAB — PROTIME-INR: INR: 2.01 — ABNORMAL HIGH (ref <1.50)

## 2013-08-09 ENCOUNTER — Ambulatory Visit (INDEPENDENT_AMBULATORY_CARE_PROVIDER_SITE_OTHER): Payer: Medicare Other | Admitting: Pharmacist Clinician (PhC)/ Clinical Pharmacy Specialist

## 2013-08-09 ENCOUNTER — Ambulatory Visit (INDEPENDENT_AMBULATORY_CARE_PROVIDER_SITE_OTHER): Payer: Medicare Other | Admitting: Urology

## 2013-08-09 DIAGNOSIS — I4891 Unspecified atrial fibrillation: Secondary | ICD-10-CM

## 2013-08-09 DIAGNOSIS — N3020 Other chronic cystitis without hematuria: Secondary | ICD-10-CM

## 2013-08-09 DIAGNOSIS — R3129 Other microscopic hematuria: Secondary | ICD-10-CM

## 2013-08-09 DIAGNOSIS — Z7901 Long term (current) use of anticoagulants: Secondary | ICD-10-CM

## 2013-08-12 NOTE — Telephone Encounter (Signed)
Talked to pt.s daughter last week and explained to her what Dr. Alanda Amass wanted to do next

## 2013-08-13 ENCOUNTER — Other Ambulatory Visit (HOSPITAL_COMMUNITY): Payer: Self-pay | Admitting: Podiatry

## 2013-08-13 ENCOUNTER — Other Ambulatory Visit: Payer: Self-pay | Admitting: Cardiovascular Disease

## 2013-08-13 ENCOUNTER — Ambulatory Visit (HOSPITAL_COMMUNITY)
Admission: RE | Admit: 2013-08-13 | Discharge: 2013-08-13 | Disposition: A | Discharge status: Home / Self Care | Payer: Medicare Other | Source: Ambulatory Visit | Attending: Podiatry | Admitting: Podiatry

## 2013-08-13 DIAGNOSIS — R609 Edema, unspecified: Secondary | ICD-10-CM

## 2013-08-13 DIAGNOSIS — R52 Pain, unspecified: Secondary | ICD-10-CM

## 2013-08-13 DIAGNOSIS — M79609 Pain in unspecified limb: Secondary | ICD-10-CM | POA: Insufficient documentation

## 2013-08-13 DIAGNOSIS — M7989 Other specified soft tissue disorders: Secondary | ICD-10-CM | POA: Insufficient documentation

## 2013-08-13 LAB — COMPREHENSIVE METABOLIC PANEL
AST: 20 U/L (ref 0–37)
Albumin: 3.6 g/dL (ref 3.5–5.2)
BUN: 22 mg/dL (ref 6–23)
Calcium: 9 mg/dL (ref 8.4–10.5)
Chloride: 106 mEq/L (ref 96–112)
Creat: 0.93 mg/dL (ref 0.50–1.10)
Glucose, Bld: 96 mg/dL (ref 70–99)
Potassium: 4.2 mEq/L (ref 3.5–5.3)

## 2013-08-13 LAB — PACEMAKER DEVICE OBSERVATION

## 2013-08-26 ENCOUNTER — Other Ambulatory Visit: Payer: Self-pay | Admitting: Pharmacist Clinician (PhC)/ Clinical Pharmacy Specialist

## 2013-08-26 ENCOUNTER — Telehealth: Payer: Self-pay | Admitting: Cardiovascular Disease

## 2013-08-26 DIAGNOSIS — Z7901 Long term (current) use of anticoagulants: Secondary | ICD-10-CM

## 2013-08-26 LAB — PROTIME-INR: INR: 2.07 — ABNORMAL HIGH (ref <1.50)

## 2013-08-26 NOTE — Telephone Encounter (Signed)
[place] w/ [facility] called.  Stated pt is there for INR check w/o order.  Stated pt said JC told her she needed to get it done since she is on an abx.  Informed no order available and RN will have to discuss w/ PharmD as last anticoag visit states pt to have repeated on 10.10.14.  Verbalized understanding.  Belenda Cruise, PharmD notified and advised pt can have drawn today.  Stated cephalexin does not interfere w/ coumadin and pt can keep regular f/u checks.  Lab order done.  Call to [facility] and spoke w/ Selena Batten.  Informed lab order placed and asked her to inform pt to stay on scheduled checks.  Agreed.

## 2013-08-27 ENCOUNTER — Ambulatory Visit (INDEPENDENT_AMBULATORY_CARE_PROVIDER_SITE_OTHER): Payer: Medicare Other | Admitting: Pharmacist Clinician (PhC)/ Clinical Pharmacy Specialist

## 2013-08-27 DIAGNOSIS — Z7901 Long term (current) use of anticoagulants: Secondary | ICD-10-CM

## 2013-08-27 DIAGNOSIS — I4891 Unspecified atrial fibrillation: Secondary | ICD-10-CM

## 2013-09-10 ENCOUNTER — Encounter (HOSPITAL_COMMUNITY): Payer: Self-pay

## 2013-09-10 ENCOUNTER — Ambulatory Visit (HOSPITAL_COMMUNITY): Admit: 2013-09-10 | Payer: Medicare Other | Admitting: Cardiovascular Disease

## 2013-09-10 SURGERY — ANGIOGRAM, LOWER EXTREMITY
Anesthesia: LOCAL

## 2013-09-17 ENCOUNTER — Telehealth: Payer: Self-pay | Admitting: Pharmacist Clinician (PhC)/ Clinical Pharmacy Specialist

## 2013-09-17 NOTE — Telephone Encounter (Signed)
Did cipro bid, now on qhs.  Will see urologist this Friday.  Needs appt with new cardiologist, [facility] retired.  Get INR drawn this week

## 2013-09-17 NOTE — Telephone Encounter (Signed)
Please call-need to find out where can she get her Coumadin? Also question about how often she should get it?

## 2013-09-18 ENCOUNTER — Encounter: Payer: Self-pay | Admitting: *Deleted

## 2013-09-18 ENCOUNTER — Ambulatory Visit: Payer: Medicare Other | Admitting: Internal Medicine

## 2013-09-19 ENCOUNTER — Telehealth: Payer: Self-pay | Admitting: Cardiovascular Disease

## 2013-09-19 DIAGNOSIS — I4891 Unspecified atrial fibrillation: Secondary | ICD-10-CM

## 2013-09-19 DIAGNOSIS — Z7901 Long term (current) use of anticoagulants: Secondary | ICD-10-CM

## 2013-09-19 LAB — PROTIME-INR
INR: 2.43 — ABNORMAL HIGH
Prothrombin Time: 25.5 s — ABNORMAL HIGH (ref 11.6–15.2)

## 2013-09-19 NOTE — Telephone Encounter (Signed)
[facility] called and need order for PT/INR.  Standing order for PT/INR entered and released.

## 2013-09-20 ENCOUNTER — Ambulatory Visit (INDEPENDENT_AMBULATORY_CARE_PROVIDER_SITE_OTHER): Payer: Medicare Other | Admitting: Pharmacist Clinician (PhC)/ Clinical Pharmacy Specialist

## 2013-09-20 DIAGNOSIS — I4891 Unspecified atrial fibrillation: Secondary | ICD-10-CM

## 2013-09-20 DIAGNOSIS — Z7901 Long term (current) use of anticoagulants: Secondary | ICD-10-CM

## 2013-10-14 ENCOUNTER — Other Ambulatory Visit: Payer: Self-pay | Admitting: Pharmacist Clinician (PhC)/ Clinical Pharmacy Specialist

## 2013-10-14 MED ORDER — WARFARIN SODIUM 3 MG PO TABS: ORAL_TABLET | ORAL | Status: DC

## 2013-10-21 ENCOUNTER — Telehealth: Payer: Self-pay | Admitting: Cardiovascular Disease

## 2013-10-21 DIAGNOSIS — Z7901 Long term (current) use of anticoagulants: Secondary | ICD-10-CM

## 2013-10-21 NOTE — Telephone Encounter (Signed)
Daughter calling to see when next inr needs to be checked.  Please call.

## 2013-10-21 NOTE — Telephone Encounter (Signed)
Returned call and pt verified x 2 w/ daughter, [name].  Informed pt was due for INR check on Friday.  [name] stated order needed for pt's INR check.  Informed will call [facility] (per previous phone notes) for fax number and fax order.  Verbalized understanding and agreed w/ plan.  Order faxed to [phone].

## 2013-10-23 ENCOUNTER — Ambulatory Visit (INDEPENDENT_AMBULATORY_CARE_PROVIDER_SITE_OTHER): Payer: Medicare Other | Admitting: Pharmacist Clinician (PhC)/ Clinical Pharmacy Specialist

## 2013-10-23 ENCOUNTER — Telehealth: Payer: Self-pay | Admitting: Internal Medicine

## 2013-10-23 DIAGNOSIS — Z7901 Long term (current) use of anticoagulants: Secondary | ICD-10-CM

## 2013-10-23 DIAGNOSIS — I4891 Unspecified atrial fibrillation: Secondary | ICD-10-CM

## 2013-10-23 NOTE — Telephone Encounter (Signed)
Pt has appt with Dr C 11-14-13/mt

## 2013-11-08 ENCOUNTER — Ambulatory Visit (INDEPENDENT_AMBULATORY_CARE_PROVIDER_SITE_OTHER): Payer: Medicare Other | Admitting: Urology

## 2013-11-08 DIAGNOSIS — N3020 Other chronic cystitis without hematuria: Secondary | ICD-10-CM

## 2013-11-08 DIAGNOSIS — N3941 Urge incontinence: Secondary | ICD-10-CM

## 2013-11-14 ENCOUNTER — Ambulatory Visit (INDEPENDENT_AMBULATORY_CARE_PROVIDER_SITE_OTHER): Payer: Medicare Other | Admitting: Pharmacist Clinician (PhC)/ Clinical Pharmacy Specialist

## 2013-11-14 ENCOUNTER — Encounter: Payer: Self-pay | Admitting: Cardiovascular Disease

## 2013-11-14 ENCOUNTER — Ambulatory Visit (INDEPENDENT_AMBULATORY_CARE_PROVIDER_SITE_OTHER): Payer: Medicare Other | Admitting: Cardiovascular Disease

## 2013-11-14 VITALS — BP 110/60 | HR 96 | Ht 61.0 in | Wt 151.6 lb

## 2013-11-14 DIAGNOSIS — I4891 Unspecified atrial fibrillation: Secondary | ICD-10-CM

## 2013-11-14 DIAGNOSIS — Z950 Presence of cardiac pacemaker: Secondary | ICD-10-CM

## 2013-11-14 DIAGNOSIS — I340 Nonrheumatic mitral (valve) insufficiency: Secondary | ICD-10-CM

## 2013-11-14 DIAGNOSIS — I059 Rheumatic mitral valve disease, unspecified: Secondary | ICD-10-CM

## 2013-11-14 DIAGNOSIS — E785 Hyperlipidemia, unspecified: Secondary | ICD-10-CM

## 2013-11-14 DIAGNOSIS — Z7901 Long term (current) use of anticoagulants: Secondary | ICD-10-CM

## 2013-11-14 LAB — MDC_IDC_ENUM_SESS_TYPE_INCLINIC
Battery Impedance: 100 Ohm
Battery Remaining Longevity: 13.5
Battery Voltage: 2.79 V
Lead Channel Pacing Threshold Amplitude: 0.5 V
Lead Channel Setting Pacing Amplitude: 2 V
Lead Channel Setting Pacing Pulse Width: 0.4 ms
Lead Channel Setting Sensing Sensitivity: 5.6 mV

## 2013-11-14 LAB — POCT INR: INR: 2.6

## 2013-11-14 LAB — PACEMAKER DEVICE OBSERVATION

## 2013-11-14 MED ORDER — METOPROLOL TARTRATE 25 MG PO TABS: ORAL_TABLET | ORAL | Status: DC

## 2013-11-14 NOTE — Patient Instructions (Addendum)
Increase Metoprolol to 1/2 tablet in the mornings and 1 tablet in the evenings.  Remote monitoring is used to monitor your pacemaker from home. This monitoring reduces the number of office visits required to check your device to one time per year. It allows [place] to keep an eye on the functioning of your device to ensure it is working properly. You are scheduled for a device check from home on 02-17-2014. You may send your transmission at any time that day. If you have a wireless device, the transmission will be sent automatically. After your physician reviews your transmission, you will receive a postcard with your next transmission date.  Your physician recommends that you schedule a follow-up appointment in: 12 months with Dr.Croitoru

## 2013-11-14 NOTE — Progress Notes (Addendum)
Patient ID: [name], female   DOB: 27-Mar-1927, 79 y.o.   MRN: [id number]  Reason for office visit  atrial fibrillation, pacemaker   [name] is here accompanied by her daughter. She is feeling well. I met her once before when I performed a single chamber permanent pacemaker generator change out in August of this year. She is here to establish cardiology followup after Dr. Rocco Serene retirement.  Initially received a pacemaker in 2007 for symptomatic atrial fibrillation slow ventricular response. The pacemaker generator change at was necessary earlier this year. She does not have a history of stroke or TIA the, where up. She is on chronic warfarin anticoagulation and this has generally been fairly steady without bleeding complications or need for dosing changes. She is stay on her feet and has not had any falls.  She has little in the way of structural heart disease. She had minor coronary atherosclerosis by catheterization in 2005 and had a negative nuclear perfusion study in 2009. She has mitral insufficiency that has been variably estimated to be mild to moderate, moderate or moderate to severe on echocardiograms over the years. Left ventricular ejection fraction has been normal at around 55% although her left ventricle is mildly dilated.  About 2-3 years ago her dose of beta blocker was reduced because of complaints of orthostatic dizziness. She is now taking Toprol tartrate only once daily.  Has occasional problems with ankle edema and her daughter occasionally increases her for soma to double dose when this happens. She does not need to adjust the diuretic dose more than once or twice a month.  She has had a long course of problem with a urinary tract infection and is now on a fairly lengthy prescription of ciprofloxacin  Allergies   Allergen  Reactions   .  Contrast Media [Iodinated Diagnostic Agents]  Other (See Comments)     "shaking"   .  Penicillins  Other (See Comments)      REACTION: Unknown    Current Outpatient Prescriptions   Medication  Sig  Dispense  Refill   .  ciprofloxacin (CIPRO) 250 MG tablet  Take 250 mg by mouth at bedtime.     .  Cyanocobalamin (VITAMIN B-12 PO)  Take 1 tablet by mouth daily.     .  diphenhydrAMINE (ALLERGY MEDICATION) 25 mg capsule  Take 25 mg by mouth daily as needed. For allergies     .  furosemide (LASIX) 20 MG tablet  Take 20 mg by mouth daily. Take 1-2 daily     .  metoprolol tartrate (LOPRESSOR) 25 MG tablet  Take 1/2 tablet in the mornings and 1 tablet in the evenings.  135 tablet  3   .  Multiple Vitamins-Minerals (CENTRUM SILVER PO)  Take 1 tablet by mouth daily.     Marland Kitchen  omeprazole (PRILOSEC) 20 MG capsule  Take 20 mg by mouth daily.     .  simvastatin (ZOCOR) 20 MG tablet  Take 10 mg by mouth every evening.     Marland Kitchen  tetrahydrozoline (EYE DROPS) 0.05 % ophthalmic solution  Place 1 drop into both eyes daily as needed. For dry eye relief     .  Travoprost, BAK Free, (TRAVATAMN) 0.004 % SOLN ophthalmic solution  Place 1 drop into both eyes at bedtime.     .  traZODone (DESYREL) 50 MG tablet  Take 50 mg by mouth at bedtime.     Marland Kitchen  warfarin (COUMADIN) 3 MG tablet  Take  1 to 1 & 1/2 tablets by mouth daily as directed  120 tablet  1    No current facility-administered medications for this visit.    Past Medical History   Diagnosis  Date   .  Dementia    .  Pacemaker    .  Hypertension    .  Acid reflux     No past surgical history on file.  No family history on file.  History    Social History   .  Marital Status:  Widowed     Spouse Name:  N/A     Number of Children:  N/A   .  Years of Education:  N/A    Occupational History   .  Not on file.    Social History Main Topics   .  Smoking status:  Never Smoker   .  Smokeless tobacco:  Not on file   .  Alcohol Use:  No   .  Drug Use:  No   .  Sexual Activity:  No    Other Topics  Concern   .  Not on file    Social History Narrative   .  No narrative on file     Review of systems:  He is very sedentary. She has no complaints The patient specifically denies any chest pain at rest or with exertion, dyspnea at rest or with exertion, orthopnea, paroxysmal nocturnal dyspnea, syncope, palpitations, focal neurological deficits, intermittent claudication, lower extremity edema, unexplained weight gain, cough, hemoptysis or wheezing.  The patient also denies abdominal pain, nausea, vomiting, dysphagia, diarrhea, constipation, polyuria, polydipsia, dysuria, hematuria, frequency, urgency, abnormal bleeding or bruising, fever, chills, unexpected weight changes, mood swings, change in skin or hair texture, change in voice quality, auditory or visual problems, allergic reactions or rashes, new musculoskeletal complaints other than usual "aches and pains".   PHYSICAL EXAM  BP 110/60  Pulse 96  Ht 5\' 1"  (1.549 m)  Wt 151 lb 9.6 oz (68.765 kg)  BMI 28.66 kg/m2   General: Alert, oriented x3, no distress Head: no evidence of trauma, PERRL, EOMI, no exophtalmos or lid lag, no myxedema, no xanthelasma; normal ears, nose and oropharynx Neck: normal jugular venous pulsations and no hepatojugular reflux; brisk carotid pulses without delay and no carotid bruits Chest: clear to auscultation, no signs of consolidation by percussion or palpation, normal fremitus, symmetrical and full respiratory excursions Cardiovascular: normal position and quality of the apical impulse, irregular rhythm, normal first and widely split second heart sounds, no  rubs or gallops, 2 over 6 holosystolic apical murmur radiating up toward the base Abdomen: no tenderness or distention, no masses by palpation, no abnormal pulsatility or arterial bruits, normal bowel sounds, no hepatosplenomegaly Extremities: no clubbing, cyanosis or edema; 2+ radial, ulnar and brachial pulses bilaterally; 2+ right femoral, posterior tibial and dorsalis pedis pulses; 2+ left femoral, posterior tibial and dorsalis pedis  pulses; no subclavian or femoral bruits Neurological: grossly nonfocal   EKG: Atrial Fibrillation, right bundle branch block, chronic ST depression to inversion in the anterolateral and inferior leads  Lipid Panel cholesterol 186, triglycerides 1:15, HDL 66, LDL 97  BMET    Component  Value  Date/Time    NA  140  08/13/2013 1218    K  4.2  08/13/2013 1218    CL  106  08/13/2013 1218    CO2  25  08/13/2013 1218    GLUCOSE  96  08/13/2013 1218    BUN  22  08/13/2013 1218    CREATININE  0.93  08/13/2013 1218    CREATININE  0.89  07/05/2013 0830    CALCIUM  9.0  08/13/2013 1218    GFRNONAA  58*  07/05/2013 0830    GFRAA  67*  07/05/2013 0830    ASSESSMENT AND PLAN   Atrial fibrillation Ventricular rate control is suboptimal. This is probably because she is taking once daily metoprolol tartrate. I recommended that she start taking metoprolol 12.5 mg every morning in addition to the 25 mg takes in the evening. Continue full dose anticoagulations. She has not had any bleeding problems. As far as I can tell she does not have a stroke or TIA history, but she is elderly and has systemic hypertension. Her daughter did ask about normal anticoagulants I told him that these are a very viable alternative to the warfarin but would be more expensive. We discussed the pros and cons of each approach. At this point she will remain on warfarin therapy, which has been fairly easy to control and stay. We also discussed the risk of drug drug interactions with warfarin, including from the Cipro she's currently taking. It sounds like this will be a long-term treatment and as such should not be an issue until it is time to discontinue it. At that point she may require a slight increase in the dose of warfarin.  Pacemaker - Medtronic 2007, new generator 2014 Device function is normal. There is a low prevalence of ventricular pacing around 19%. On the other hand ventricular rates in excess of 120 beats per minute are seen  relatively frequently. Lead parameters are excellent. No device reprogramming was performed today.  Hyperlipidemia Good parameterson recent lipid profile  Mitral insufficiency The exact severity of this is not very clear to me. It may be contributing to her problems with edema but she is not short of breath. I don't think she would be a good candidate for surgical repair. Continue conservative management.    Orders Placed This Encounter   Procedures   .  EKG 12-Lead    Meds ordered this encounter   Medications   .  ciprofloxacin (CIPRO) 250 MG tablet     Sig: Take 250 mg by mouth at bedtime.   .  furosemide (LASIX) 20 MG tablet     Sig: Take 20 mg by mouth daily. Take 1-2 daily   .  metoprolol tartrate (LOPRESSOR) 25 MG tablet     Sig: Take 1/2 tablet in the mornings and 1 tablet in the evenings.     Dispense: 135 tablet     Refill: 3    Steffie Waggoner  Thurmon Fair, MD, [facility]

## 2013-11-14 NOTE — Assessment & Plan Note (Signed)
Device function is normal. There is a low prevalence of ventricular pacing around 19%. On the other hand ventricular rates in excess of 120 beats per minute are seen relatively frequently. Lead parameters are excellent. No device reprogramming was performed today.

## 2013-11-14 NOTE — Assessment & Plan Note (Signed)
Ventricular rate control is suboptimal. This is probably because she is taking once daily metoprolol tartrate. I recommended that she start taking metoprolol 12.5 mg every morning in addition to the 25 mg takes in the evening. Continue full dose anticoagulations. She has not had any bleeding problems. As far as I can tell she does not have a stroke or TIA history, but she is elderly and has systemic hypertension. Her daughter did ask about normal anticoagulants I told him that these are a very viable alternative to the warfarin but would be more expensive. We discussed the pros and cons of each approach. At this point she will remain on warfarin therapy, which has been fairly easy to control and stay. We also discussed the risk of drug drug interactions with warfarin, including from the Cipro she's currently taking. It sounds like this will be a long-term treatment and as such should not be an issue until it is time to discontinue it. At that point she may require a slight increase in the dose of warfarin.

## 2013-11-14 NOTE — Assessment & Plan Note (Signed)
The exact severity of this is not very clear to me. It may be contributing to her problems with edema but she is not short of breath. I don't think she would be a good candidate for surgical repair. Continue conservative management.

## 2013-11-14 NOTE — Assessment & Plan Note (Signed)
Good parameterson recent lipid profile

## 2013-11-14 NOTE — Progress Notes (Signed)
Patient ID: [name], female   DOB: 08/14/1927, 79 y.o.   MRN: [id number]      Reason for office visit atrial fibrillation, pacemaker  [name] is here accompanied by her daughter. She is feeling well. I met her once before when I performed a single chamber permanent pacemaker generator change out in August of this year. She is here to establish cardiology followup after Dr. Rocco Serene retirement.  Initially received a pacemaker in 2007 for symptomatic atrial fibrillation slow ventricular response. The pacemaker generator change at was necessary earlier this year. She does not have a history of stroke or TIA the, where up. She is on chronic warfarin anticoagulation and this has generally been fairly steady without bleeding complications or need for dosing changes. She is stay on her feet and has not had any falls.  She has little in the way of structural heart disease. She had minor coronary atherosclerosis by catheterization in 2005 and had a negative nuclear perfusion study in 2009. She has mitral insufficiency that has been variably estimated to be mild to moderate, moderate or moderate to severe on echocardiograms over the years. Left ventricular ejection fraction has been normal at around 55% although her left ventricle is mildly dilated.   About 2-3 years ago her dose of beta blocker was reduced because of complaints of orthostatic dizziness. She is now taking Toprol tartrate only once daily.  Has occasional problems with ankle edema and her daughter occasionally increases her for soma to double dose when this happens. She does not need to adjust the diuretic dose more than once or twice a month.  She has had a long course of problem with a urinary tract infection and is now on a fairly lengthy prescription of ciprofloxacin   Allergies  Allergen Reactions  . Contrast Media [Iodinated Diagnostic Agents] Other (See Comments)    "shaking"  . Penicillins Other (See  Comments)    REACTION: Unknown    Current Outpatient Prescriptions  Medication Sig Dispense Refill  . ciprofloxacin (CIPRO) 250 MG tablet Take 250 mg by mouth at bedtime.      . Cyanocobalamin (VITAMIN B-12 PO) Take 1 tablet by mouth daily.      . diphenhydrAMINE (ALLERGY MEDICATION) 25 mg capsule Take 25 mg by mouth daily as needed. For allergies      . furosemide (LASIX) 20 MG tablet Take 20 mg by mouth daily. Take 1-2 daily      . metoprolol tartrate (LOPRESSOR) 25 MG tablet Take 1/2 tablet in the mornings and 1 tablet in the evenings.  135 tablet  3  . Multiple Vitamins-Minerals (CENTRUM SILVER PO) Take 1 tablet by mouth daily.        Marland Kitchen omeprazole (PRILOSEC) 20 MG capsule Take 20 mg by mouth daily.        . simvastatin (ZOCOR) 20 MG tablet Take 10 mg by mouth every evening.      Marland Kitchen tetrahydrozoline (EYE DROPS) 0.05 % ophthalmic solution Place 1 drop into both eyes daily as needed. For dry eye relief      . Travoprost, BAK Free, (TRAVATAMN) 0.004 % SOLN ophthalmic solution Place 1 drop into both eyes at bedtime.       . traZODone (DESYREL) 50 MG tablet Take 50 mg by mouth at bedtime.      Marland Kitchen warfarin (COUMADIN) 3 MG tablet Take 1 to 1 & 1/2 tablets by mouth daily as directed  120 tablet  1   No current facility-administered  medications for this visit.    Past Medical History  Diagnosis Date  . Dementia   . Pacemaker   . Hypertension   . Acid reflux     No past surgical history on file.  No family history on file.  History   Social History  . Marital Status: Widowed    Spouse Name: N/A    Number of Children: N/A  . Years of Education: N/A   Occupational History  . Not on file.   Social History Main Topics  . Smoking status: Never Smoker   . Smokeless tobacco: Not on file  . Alcohol Use: No  . Drug Use: No  . Sexual Activity: No   Other Topics Concern  . Not on file   Social History Narrative  . No narrative on file    Review of systems: The patient  specifically denies any chest pain at rest or with exertion, dyspnea at rest or with exertion, orthopnea, paroxysmal nocturnal dyspnea, syncope, palpitations, focal neurological deficits, intermittent claudication, lower extremity edema, unexplained weight gain, cough, hemoptysis or wheezing.  The patient also denies abdominal pain, nausea, vomiting, dysphagia, diarrhea, constipation, polyuria, polydipsia, dysuria, hematuria, frequency, urgency, abnormal bleeding or bruising, fever, chills, unexpected weight changes, mood swings, change in skin or hair texture, change in voice quality, auditory or visual problems, allergic reactions or rashes, new musculoskeletal complaints other than usual "aches and pains".   PHYSICAL EXAM BP 110/60  Pulse 96  Ht 5\' 1"  (1.549 m)  Wt 151 lb 9.6 oz (68.765 kg)  BMI 28.66 kg/m2  General: Alert, oriented x3, no distress Head: no evidence of trauma, PERRL, EOMI, no exophtalmos or lid lag, no myxedema, no xanthelasma; normal ears, nose and oropharynx Neck: normal jugular venous pulsations and no hepatojugular reflux; brisk carotid pulses without delay and no carotid bruits Chest: clear to auscultation, no signs of consolidation by percussion or palpation, normal fremitus, symmetrical and full respiratory excursions; left subclavian pacemaker site Cardiovascular: normal position and quality of the apical impulse, irregular rhythm, normal first and widely split second heart sounds, no murmurs, rubs or gallops Abdomen: no tenderness or distention, no masses by palpation, no abnormal pulsatility or arterial bruits, normal bowel sounds, no hepatosplenomegaly Extremities: no clubbing, cyanosis or edema; 2+ radial, ulnar and brachial pulses bilaterally; 2+ right femoral, posterior tibial and dorsalis pedis pulses; 2+ left femoral, posterior tibial and dorsalis pedis pulses; no subclavian or femoral bruits Neurological: grossly nonfocal   EKG:  atrial fibrillation, right  bundle branch block, inferior and anterolateral ST segment depression and T-wave inversion, chronic   Lipid Panel  April 2014 total cholesterol 186, triglycerides 115, HDL 66, LDL 97    BMET    Component Value Date/Time   NA 140 08/13/2013 1218   K 4.2 08/13/2013 1218   CL 106 08/13/2013 1218   CO2 25 08/13/2013 1218   GLUCOSE 96 08/13/2013 1218   BUN 22 08/13/2013 1218   CREATININE 0.93 08/13/2013 1218   CREATININE 0.89 07/05/2013 0830   CALCIUM 9.0 08/13/2013 1218   GFRNONAA 58* 07/05/2013 0830   GFRAA 67* 07/05/2013 0830     ASSESSMENT AND PLAN Atrial fibrillation Ventricular rate control is suboptimal. This is probably because she is taking once daily metoprolol tartrate. I recommended that she start taking metoprolol 12.5 mg every morning in addition to the 25 mg takes in the evening. Continue full dose anticoagulations. She has not had any bleeding problems. As far as I can tell she does not  have a stroke or TIA history, but she is elderly and has systemic hypertension. Her daughter did ask about normal anticoagulants I told him that these are a very viable alternative to the warfarin but would be more expensive. We discussed the pros and cons of each approach. At this point she will remain on warfarin therapy, which has been fairly easy to control and stay. We also discussed the risk of drug drug interactions with warfarin, including from the Cipro she's currently taking. It sounds like this will be a long-term treatment and as such should not be an issue until it is time to discontinue it. At that point she may require a slight increase in the dose of warfarin.  Pacemaker - Medtronic 2007, new generator 2014 Device function is normal. There is a low prevalence of ventricular pacing around 19%. On the other hand ventricular rates in excess of 120 beats per minute are seen relatively frequently. Lead parameters are excellent. No device reprogramming was performed today.   Orders Placed This  Encounter  Procedures  . EKG 12-Lead   Meds ordered this encounter  Medications  . ciprofloxacin (CIPRO) 250 MG tablet    Sig: Take 250 mg by mouth at bedtime.  . furosemide (LASIX) 20 MG tablet    Sig: Take 20 mg by mouth daily. Take 1-2 daily  . metoprolol tartrate (LOPRESSOR) 25 MG tablet    Sig: Take 1/2 tablet in the mornings and 1 tablet in the evenings.    Dispense:  135 tablet    Refill:  3    [name]  Norma Fair, MD, [facility] [phone] office [phone] pager

## 2013-11-18 ENCOUNTER — Emergency Department (HOSPITAL_COMMUNITY): Payer: Medicare Other

## 2013-11-18 ENCOUNTER — Encounter (HOSPITAL_COMMUNITY): Payer: Self-pay | Admitting: Emergency Medicine

## 2013-11-18 ENCOUNTER — Emergency Department (HOSPITAL_COMMUNITY)
Admission: EM | Admit: 2013-11-18 | Discharge: 2013-11-18 | Disposition: A | Discharge status: Home / Self Care | Payer: Medicare Other | Attending: Emergency Medicine | Admitting: Emergency Medicine

## 2013-11-18 DIAGNOSIS — F0390 Unspecified dementia without behavioral disturbance: Secondary | ICD-10-CM | POA: Insufficient documentation

## 2013-11-18 DIAGNOSIS — I4891 Unspecified atrial fibrillation: Secondary | ICD-10-CM | POA: Insufficient documentation

## 2013-11-18 DIAGNOSIS — Z792 Long term (current) use of antibiotics: Secondary | ICD-10-CM | POA: Insufficient documentation

## 2013-11-18 DIAGNOSIS — K219 Gastro-esophageal reflux disease without esophagitis: Secondary | ICD-10-CM | POA: Insufficient documentation

## 2013-11-18 DIAGNOSIS — I10 Essential (primary) hypertension: Secondary | ICD-10-CM | POA: Insufficient documentation

## 2013-11-18 DIAGNOSIS — Z79899 Other long term (current) drug therapy: Secondary | ICD-10-CM | POA: Insufficient documentation

## 2013-11-18 DIAGNOSIS — Z7901 Long term (current) use of anticoagulants: Secondary | ICD-10-CM | POA: Insufficient documentation

## 2013-11-18 DIAGNOSIS — N390 Urinary tract infection, site not specified: Secondary | ICD-10-CM | POA: Insufficient documentation

## 2013-11-18 DIAGNOSIS — Z950 Presence of cardiac pacemaker: Secondary | ICD-10-CM | POA: Insufficient documentation

## 2013-11-18 DIAGNOSIS — Y92009 Unspecified place in unspecified non-institutional (private) residence as the place of occurrence of the external cause: Secondary | ICD-10-CM | POA: Insufficient documentation

## 2013-11-18 DIAGNOSIS — Y939 Activity, unspecified: Secondary | ICD-10-CM | POA: Insufficient documentation

## 2013-11-18 DIAGNOSIS — E785 Hyperlipidemia, unspecified: Secondary | ICD-10-CM | POA: Insufficient documentation

## 2013-11-18 DIAGNOSIS — Z880 Allergy status to penicillin: Secondary | ICD-10-CM | POA: Insufficient documentation

## 2013-11-18 DIAGNOSIS — W19XXXA Unspecified fall, initial encounter: Secondary | ICD-10-CM | POA: Insufficient documentation

## 2013-11-18 HISTORY — DX: Unspecified atrial fibrillation: I48.91

## 2013-11-18 HISTORY — DX: Hyperlipidemia, unspecified: E78.5

## 2013-11-18 HISTORY — DX: Bradycardia, unspecified: R00.1

## 2013-11-18 LAB — CBC WITH DIFFERENTIAL/PLATELET
Basophils Absolute: 0 10*3/uL (ref 0.0–0.1)
Lymphocytes Relative: 20 % (ref 12–46)
Lymphs Abs: 1.4 10*3/uL (ref 0.7–4.0)
MCH: 29.8 pg (ref 26.0–34.0)
Neutro Abs: 4.9 10*3/uL (ref 1.7–7.7)
Neutrophils Relative %: 72 % (ref 43–77)
Platelets: 210 10*3/uL (ref 150–400)
RBC: 4.26 MIL/uL (ref 3.87–5.11)
RDW: 14.9 % (ref 11.5–15.5)
WBC: 6.8 10*3/uL (ref 4.0–10.5)

## 2013-11-18 LAB — URINALYSIS, ROUTINE W REFLEX MICROSCOPIC
Glucose, UA: NEGATIVE mg/dL
Nitrite: NEGATIVE
Specific Gravity, Urine: 1.02 (ref 1.005–1.030)
Urobilinogen, UA: 0.2 mg/dL (ref 0.0–1.0)
pH: 8 (ref 5.0–8.0)

## 2013-11-18 LAB — BASIC METABOLIC PANEL
CO2: 26 mEq/L (ref 19–32)
Chloride: 102 mEq/L (ref 96–112)
Glucose, Bld: 108 mg/dL — ABNORMAL HIGH (ref 70–99)
Potassium: 4.2 mEq/L (ref 3.5–5.1)
Sodium: 139 mEq/L (ref 135–145)

## 2013-11-18 LAB — URINE MICROSCOPIC-ADD ON

## 2013-11-18 MED ORDER — CEPHALEXIN 500 MG PO CAPS: 500.0000 mg | ORAL_CAPSULE | Freq: Four times a day (QID) | ORAL | Status: DC

## 2013-11-18 MED ORDER — DEXTROSE 5 % IV SOLN
INTRAVENOUS | Status: AC
  Filled 2013-11-18: qty 10

## 2013-11-18 MED ORDER — DEXTROSE 5 % IV SOLN
1.0000 g | Freq: Once | INTRAVENOUS | Status: AC
  Administered 2013-11-18: 1 g via INTRAVENOUS
  Filled 2013-11-18: qty 10

## 2013-11-18 NOTE — ED Notes (Signed)
Patient with no complaints at this time. Respirations even and unlabored. Skin warm/dry. Discharge instructions reviewed with patient at this time. Patient given opportunity to voice concerns/ask questions. IV removed per policy and band-aid applied to site. Patient discharged at this time and left Emergency Department via wheelchair without difficulty.

## 2013-11-18 NOTE — ED Provider Notes (Signed)
CSN: [id number]     Arrival date & time 11/18/13  1159 History  This chart was scribed for Gilda Crease, * by Leone Payor, ED Scribe. This patient was seen in room [facility] and the patient's care was started 1:06 PM.    Chief Complaint  Patient presents with  . Fall    The history is provided by the patient and a relative. No language interpreter was used.    HPI Comments: [name] is a 79 y.o. female who presents to the Emergency Department complaining of a fall that occurred earlier today. Pt lives alone but daughter states she went to check on her and found her on the floor. Per daughter, pt had decreased responsiveness so she called EMS. Pt unsure if she had LOC or if it was just a fall. Daughter states pt has had episodes of diarrhea and leg swelling. Pt denies chest pain, SOB, abdominal pain, or any other symptoms.   Past Medical History  Diagnosis Date  . Dementia   . Pacemaker   . Hypertension   . Acid reflux   . Atrial fibrillation   . Symptomatic bradycardia   . Hyperlipidemia    Past Surgical History  Procedure Laterality Date  . Pacemaker insertion     No family history on file. History  Substance Use Topics  . Smoking status: Never Smoker   . Smokeless tobacco: Not on file  . Alcohol Use: No   OB History   Grav Para Term Preterm Abortions TAB SAB Ect Mult Living                 Review of Systems  Constitutional: Negative for fever.  Respiratory: Negative for shortness of breath.   Cardiovascular: Negative for chest pain.  Gastrointestinal: Negative for abdominal pain.  Musculoskeletal: Negative for back pain.  All other systems reviewed and are negative.    Allergies  Contrast media and Penicillins  Home Medications   Current Outpatient Rx  Name  Route  Sig  Dispense  Refill  . ciprofloxacin (CIPRO) 250 MG tablet   Oral   Take 250 mg by mouth at bedtime.         . Cyanocobalamin (VITAMIN B-12 PO)   Oral   Take 1 tablet  by mouth daily.         . diphenhydrAMINE (ALLERGY MEDICATION) 25 mg capsule   Oral   Take 25 mg by mouth daily as needed. For allergies         . furosemide (LASIX) 20 MG tablet   Oral   Take 20-40 mg by mouth daily. Take 1-2 daily         . metoprolol tartrate (LOPRESSOR) 25 MG tablet   Oral   Take 12.5-25 mg by mouth 2 (two) times daily. Take 1/2 tablet (12.5 mg)  in the mornings and 1 tablet (25 mg )  in the evenings.         . Multiple Vitamins-Minerals (CENTRUM SILVER PO)   Oral   Take 1 tablet by mouth daily.           Marland Kitchen omeprazole (PRILOSEC) 20 MG capsule   Oral   Take 20 mg by mouth daily.           . simvastatin (ZOCOR) 20 MG tablet   Oral   Take 10 mg by mouth every evening.         Marland Kitchen tetrahydrozoline (EYE DROPS) 0.05 % ophthalmic solution   Both Eyes  Place 1 drop into both eyes daily as needed. For dry eye relief         . Travoprost, BAK Free, (TRAVATAMN) 0.004 % SOLN ophthalmic solution   Both Eyes   Place 1 drop into both eyes at bedtime.          . traZODone (DESYREL) 50 MG tablet   Oral   Take 50 mg by mouth at bedtime.         Marland Kitchen warfarin (COUMADIN) 3 MG tablet   Oral   Take 3-4.5 mg by mouth daily. Takes 1.5 tablets (4.5 mg) on Mondays, Wednesdays and Fridays. Takes 1 tablet (3 mg) on all other days          BP 135/42  Pulse 97  Temp(Src) 98.7 F (37.1 C) (Oral)  Resp 18  Wt 150 lb (68.04 kg)  SpO2 95% Physical Exam  Nursing note and vitals reviewed. Constitutional: She is oriented to person, place, and time. She appears well-developed and well-nourished. No distress.  HENT:  Head: Normocephalic and atraumatic.  Right Ear: Hearing normal.  Left Ear: Hearing normal.  Nose: Nose normal.  Mouth/Throat: Oropharynx is clear and moist and mucous membranes are normal.  Eyes: Conjunctivae and EOM are normal. Pupils are equal, round, and reactive to light.  Neck: Normal range of motion. Neck supple.  Cardiovascular: Normal  rate, S1 normal, S2 normal and normal heart sounds.  An irregularly irregular rhythm present. Exam reveals no gallop and no friction rub.   No murmur heard. Pulmonary/Chest: Effort normal and breath sounds normal. No respiratory distress. She exhibits no tenderness.  Abdominal: Soft. Normal appearance and bowel sounds are normal. There is no hepatosplenomegaly. There is no tenderness. There is no rebound, no guarding, no tenderness at McBurney's point and negative Murphy's sign. No hernia.  Musculoskeletal: Normal range of motion.  Neurological: She is alert and oriented to person, place, and time. She has normal strength. No cranial nerve deficit or sensory deficit. Coordination normal. GCS eye subscore is 4. GCS verbal subscore is 5. GCS motor subscore is 6.  Skin: Skin is warm, dry and intact. No rash noted. No cyanosis.  Psychiatric: She has a normal mood and affect. Her speech is normal and behavior is normal. Thought content normal.    ED Course  Procedures   DIAGNOSTIC STUDIES: Oxygen Saturation is 95% on RA, adequate by my interpretation.    COORDINATION OF CARE: 1:08 PM Will order head CT, CXR, UA, CBC, BMP. Discussed treatment plan with pt at bedside and pt agreed to plan.  Medications - No data to display    Labs Review Labs Reviewed  CBC WITH DIFFERENTIAL  BASIC METABOLIC PANEL   Imaging Review No results found.  EKG Interpretation   None       MDM  Diagnosis: 1. Fall 2. Urinary tract infection  She found by family lying on the floor. It's not clear how she got there. She was lying right next to her bed, might have slipped while trying to get out of bed. Patient does remember exactly what happened. There no external signs of injury. She does take Coumadin, CT head was performed. It was negative. Cervical spine and back was clinically cleared as it is nontender. No evidence of hip injury, extremity injury.  Patient's blood work is unremarkable. Family reports that  she was recently diagnosed with urinary tract infection and has been on Cipro for some time. There is still evidence of urinary tract infection. Patient administered Rocephin  here in the ER. Most recent urine culture was from 2013, Klebsiella sensitive to Rocephin. Urine culture has been sent today. This will be sent home on Keflex. Followup with primary doctor. Return to the ER if her symptoms worsen.  I personally performed the services described in this documentation, which was scribed in my presence. The recorded information has been reviewed and is accurate.   Gilda Crease, MD 11/18/13 1719

## 2013-11-18 NOTE — ED Notes (Signed)
Patient arrives via EMS from home. Found by daughter in floor. Patient does not recall how she ended up in the floor. Alert/oriented x 3. Denies pain.

## 2013-11-18 NOTE — Progress Notes (Signed)
I told [name] that room 6 was back in her ED room from radiology @ 1410.

## 2013-11-19 LAB — URINE CULTURE

## 2013-11-26 ENCOUNTER — Telehealth: Payer: Self-pay | Admitting: *Deleted

## 2013-11-26 NOTE — Telephone Encounter (Signed)
Pt's daughter has a question about getting her mother's finger stuck for coumadin. Pt was in ER. Pt's daughter stated that she was told to take her off of the cephalexin.

## 2013-11-26 NOTE — Telephone Encounter (Signed)
Spoke with [name], advised to have INR checked on Monday 1.5.15.  Pt has been on cipro in past qd.   [name] voiced understanding.

## 2013-11-26 NOTE — Telephone Encounter (Signed)
Returned call and pt verified x 2 w/ pt's daughter, [name].  Stated pt was seen in [facility] ER for fall and put on abx for UTI.  Stated it was 500 mg four times/day, but she could only get it to pt three times/day.  Stated pt's urologist called and told her to put pt back on the other abx she takes and stop the new one b/c she didn't have an infection.  RN reviewed ER note and pt was started on cephalexin and given Rocephin in ER.  Lab report of urine culture shows no growth.  Daughter stated pt took last dose of cephalexin yesterday morning and she started pt on cipro (previous abx) last night at 250 mg.  Daughter asked if pt needed to come in sooner than 1.15.15 for INR check.  Informed Belenda Cruise, PharmD will be notified for further instructions.  Verbalized understanding.  Message forwarded to K. Alvstad, PharmD.

## 2013-12-02 ENCOUNTER — Ambulatory Visit (INDEPENDENT_AMBULATORY_CARE_PROVIDER_SITE_OTHER): Payer: Medicare Other | Admitting: Pharmacist Clinician (PhC)/ Clinical Pharmacy Specialist

## 2013-12-02 ENCOUNTER — Other Ambulatory Visit: Payer: Self-pay | Admitting: Pharmacist Clinician (PhC)/ Clinical Pharmacy Specialist

## 2013-12-02 DIAGNOSIS — Z7901 Long term (current) use of anticoagulants: Secondary | ICD-10-CM

## 2013-12-02 DIAGNOSIS — I4891 Unspecified atrial fibrillation: Secondary | ICD-10-CM

## 2013-12-02 LAB — PT WITH INR/FINGERSTICK
INR, fingerstick: 2.5 — ABNORMAL HIGH (ref 0.80–1.20)
PT FINGERSTICK: 29.9 s — AB (ref 10.4–12.5)

## 2014-01-02 ENCOUNTER — Telehealth: Payer: Self-pay | Admitting: *Deleted

## 2014-01-02 ENCOUNTER — Other Ambulatory Visit: Payer: Self-pay | Admitting: Pharmacist Clinician (PhC)/ Clinical Pharmacy Specialist

## 2014-01-02 DIAGNOSIS — Z7901 Long term (current) use of anticoagulants: Secondary | ICD-10-CM

## 2014-01-02 LAB — PT WITH INR/FINGERSTICK
INR FINGERSTICK: 3.4 — AB (ref 0.80–1.20)
PT, fingerstick: 40.4 seconds — ABNORMAL HIGH (ref 10.4–12.5)

## 2014-01-02 NOTE — Telephone Encounter (Signed)
Call from [facility] that pt there and needs orders for PT/INR check.  Order placed and visible to lab.

## 2014-01-31 ENCOUNTER — Telehealth: Payer: Self-pay | Admitting: Cardiovascular Disease

## 2014-01-31 DIAGNOSIS — Z7901 Long term (current) use of anticoagulants: Secondary | ICD-10-CM

## 2014-01-31 DIAGNOSIS — I4891 Unspecified atrial fibrillation: Secondary | ICD-10-CM

## 2014-01-31 NOTE — Telephone Encounter (Signed)
INR order released.

## 2014-02-01 LAB — PROTIME-INR
INR: 3.5 — ABNORMAL HIGH (ref <1.50)
Prothrombin Time: 34.1 seconds — ABNORMAL HIGH (ref 11.6–15.2)

## 2014-02-03 ENCOUNTER — Ambulatory Visit (INDEPENDENT_AMBULATORY_CARE_PROVIDER_SITE_OTHER): Payer: Medicare Other | Admitting: Pharmacist Clinician (PhC)/ Clinical Pharmacy Specialist

## 2014-02-03 DIAGNOSIS — Z7901 Long term (current) use of anticoagulants: Secondary | ICD-10-CM

## 2014-02-03 DIAGNOSIS — I4891 Unspecified atrial fibrillation: Secondary | ICD-10-CM

## 2014-02-07 ENCOUNTER — Ambulatory Visit (INDEPENDENT_AMBULATORY_CARE_PROVIDER_SITE_OTHER): Payer: Medicare HMO | Admitting: Urology

## 2014-02-07 DIAGNOSIS — N3020 Other chronic cystitis without hematuria: Secondary | ICD-10-CM

## 2014-02-07 DIAGNOSIS — N3941 Urge incontinence: Secondary | ICD-10-CM

## 2014-02-17 ENCOUNTER — Encounter: Payer: Medicare Other | Admitting: *Deleted

## 2014-02-17 ENCOUNTER — Other Ambulatory Visit: Payer: Self-pay | Admitting: Cardiovascular Disease

## 2014-02-17 LAB — PT WITH INR/FINGERSTICK
INR, fingerstick: 2.8 — ABNORMAL HIGH (ref 0.80–1.20)
PT, fingerstick: 33.4 seconds — ABNORMAL HIGH (ref 10.4–12.5)

## 2014-02-17 LAB — POCT INR: INR: 2.8

## 2014-02-18 ENCOUNTER — Ambulatory Visit (INDEPENDENT_AMBULATORY_CARE_PROVIDER_SITE_OTHER): Payer: Medicare HMO | Admitting: Pharmacist Clinician (PhC)/ Clinical Pharmacy Specialist

## 2014-02-18 DIAGNOSIS — I4891 Unspecified atrial fibrillation: Secondary | ICD-10-CM

## 2014-02-18 DIAGNOSIS — Z7901 Long term (current) use of anticoagulants: Secondary | ICD-10-CM

## 2014-03-05 ENCOUNTER — Other Ambulatory Visit: Payer: Self-pay | Admitting: Pharmacist Clinician (PhC)/ Clinical Pharmacy Specialist

## 2014-03-05 MED ORDER — WARFARIN SODIUM 3 MG PO TABS: ORAL_TABLET | ORAL | Status: DC

## 2014-03-07 ENCOUNTER — Encounter: Payer: Self-pay | Admitting: Pharmacist Clinician (PhC)/ Clinical Pharmacy Specialist

## 2014-03-07 DIAGNOSIS — Z7901 Long term (current) use of anticoagulants: Secondary | ICD-10-CM

## 2014-03-07 DIAGNOSIS — I4891 Unspecified atrial fibrillation: Secondary | ICD-10-CM

## 2014-03-10 ENCOUNTER — Encounter: Payer: Self-pay | Admitting: *Deleted

## 2014-03-18 ENCOUNTER — Other Ambulatory Visit: Payer: Self-pay | Admitting: Cardiovascular Disease

## 2014-03-18 LAB — PT WITH INR/FINGERSTICK
INR, fingerstick: 2.4 — ABNORMAL HIGH (ref 0.80–1.20)
PT, fingerstick: 28.7 seconds — ABNORMAL HIGH (ref 10.4–12.5)

## 2014-04-15 ENCOUNTER — Telehealth: Payer: Self-pay | Admitting: Cardiovascular Disease

## 2014-04-15 ENCOUNTER — Encounter: Payer: Self-pay | Admitting: Cardiovascular Disease

## 2014-04-15 ENCOUNTER — Ambulatory Visit (INDEPENDENT_AMBULATORY_CARE_PROVIDER_SITE_OTHER): Payer: Medicare HMO | Admitting: *Deleted

## 2014-04-15 DIAGNOSIS — I4891 Unspecified atrial fibrillation: Secondary | ICD-10-CM

## 2014-04-15 NOTE — Progress Notes (Signed)
Remote pacemaker transmission.   

## 2014-04-15 NOTE — Telephone Encounter (Signed)
Instructions reviewed with [name](daughter) for remote transmissions.

## 2014-04-15 NOTE — Telephone Encounter (Signed)
New message     Need to someone to call and tell them how to do the remote check

## 2014-04-17 ENCOUNTER — Other Ambulatory Visit: Payer: Self-pay | Admitting: Cardiovascular Disease

## 2014-04-17 ENCOUNTER — Ambulatory Visit (INDEPENDENT_AMBULATORY_CARE_PROVIDER_SITE_OTHER): Payer: Medicare HMO | Admitting: Pharmacist Clinician (PhC)/ Clinical Pharmacy Specialist

## 2014-04-17 DIAGNOSIS — Z7901 Long term (current) use of anticoagulants: Secondary | ICD-10-CM

## 2014-04-17 DIAGNOSIS — I4891 Unspecified atrial fibrillation: Secondary | ICD-10-CM

## 2014-04-17 LAB — PT WITH INR/FINGERSTICK
INR, fingerstick: 2.8 — ABNORMAL HIGH (ref 0.80–1.20)
PT, fingerstick: 33.8 seconds — ABNORMAL HIGH (ref 10.4–12.5)

## 2014-04-17 LAB — MDC_IDC_ENUM_SESS_TYPE_REMOTE
Battery Remaining Longevity: 161 mo
Lead Channel Impedance Value: 738 Ohm
Lead Channel Pacing Threshold Amplitude: 0.625 V
Lead Channel Pacing Threshold Pulse Width: 0.4 ms
Lead Channel Sensing Intrinsic Amplitude: 8 mV
Lead Channel Setting Pacing Amplitude: 2 V
Lead Channel Setting Pacing Pulse Width: 0.4 ms
MDC IDC SET LEADCHNL RV SENSING SENSITIVITY: 5.6 mV
MDC IDC STAT BRADY RV PERCENT PACED: 20.6 %

## 2014-04-28 ENCOUNTER — Encounter: Payer: Self-pay | Admitting: Cardiology

## 2014-05-16 ENCOUNTER — Other Ambulatory Visit: Payer: Self-pay | Admitting: Cardiovascular Disease

## 2014-05-16 ENCOUNTER — Ambulatory Visit (INDEPENDENT_AMBULATORY_CARE_PROVIDER_SITE_OTHER): Payer: Medicare HMO | Admitting: Urology

## 2014-05-16 DIAGNOSIS — N3020 Other chronic cystitis without hematuria: Secondary | ICD-10-CM

## 2014-05-16 DIAGNOSIS — N3941 Urge incontinence: Secondary | ICD-10-CM

## 2014-05-16 LAB — PT WITH INR/FINGERSTICK
INR, fingerstick: 3 — ABNORMAL HIGH (ref 0.80–1.20)
PT, fingerstick: 36.6 seconds — ABNORMAL HIGH (ref 10.4–12.5)

## 2014-05-16 LAB — POCT INR: INR: 3

## 2014-05-19 ENCOUNTER — Telehealth: Payer: Self-pay | Admitting: Cardiovascular Disease

## 2014-05-19 NOTE — Telephone Encounter (Signed)
Marylu LundJanet states that Mrs. Housel's feet are swelling---does she need to increase her fluid pill ?

## 2014-05-19 NOTE — Telephone Encounter (Signed)
Increased edema over the past week.  She has been taking her furosemide later in the mornings.  Even later today.  No complaints of SOB.  Will give 40mg  Lasix in AM.

## 2014-06-03 ENCOUNTER — Other Ambulatory Visit: Payer: Self-pay

## 2014-06-03 MED ORDER — WARFARIN SODIUM 3 MG PO TABS: ORAL_TABLET | ORAL | Status: DC

## 2014-06-03 NOTE — Telephone Encounter (Signed)
Rx was sent to pharmacy electronically. 

## 2014-06-04 ENCOUNTER — Ambulatory Visit (INDEPENDENT_AMBULATORY_CARE_PROVIDER_SITE_OTHER): Payer: Medicare HMO | Admitting: Pharmacist

## 2014-06-04 DIAGNOSIS — I4891 Unspecified atrial fibrillation: Secondary | ICD-10-CM

## 2014-06-04 DIAGNOSIS — Z7901 Long term (current) use of anticoagulants: Secondary | ICD-10-CM

## 2014-06-17 ENCOUNTER — Other Ambulatory Visit: Payer: Self-pay | Admitting: Cardiovascular Disease

## 2014-06-17 LAB — PT WITH INR/FINGERSTICK
INR FINGERSTICK: 3 — AB (ref 0.80–1.20)
PT, fingerstick: 35.9 seconds — ABNORMAL HIGH (ref 10.4–12.5)

## 2014-06-18 ENCOUNTER — Ambulatory Visit (INDEPENDENT_AMBULATORY_CARE_PROVIDER_SITE_OTHER): Payer: Medicare HMO | Admitting: Pharmacist Clinician (PhC)/ Clinical Pharmacy Specialist

## 2014-06-18 DIAGNOSIS — Z7901 Long term (current) use of anticoagulants: Secondary | ICD-10-CM

## 2014-06-18 NOTE — Progress Notes (Signed)
Cannot close encounter, no return date.  Dr. HRexene Edison

## 2014-07-15 ENCOUNTER — Other Ambulatory Visit: Payer: Self-pay | Admitting: Cardiovascular Disease

## 2014-07-15 LAB — PT WITH INR/FINGERSTICK
INR, fingerstick: 4.5 — ABNORMAL HIGH (ref 0.80–1.20)
PT FINGERSTICK: 53.9 s — AB (ref 10.4–12.5)

## 2014-07-16 ENCOUNTER — Ambulatory Visit (INDEPENDENT_AMBULATORY_CARE_PROVIDER_SITE_OTHER): Payer: Medicare HMO | Admitting: Pharmacist Clinician (PhC)/ Clinical Pharmacy Specialist

## 2014-07-16 DIAGNOSIS — Z7901 Long term (current) use of anticoagulants: Secondary | ICD-10-CM

## 2014-07-17 ENCOUNTER — Telehealth: Payer: Self-pay | Admitting: Cardiology

## 2014-07-17 ENCOUNTER — Ambulatory Visit (INDEPENDENT_AMBULATORY_CARE_PROVIDER_SITE_OTHER): Payer: Medicare HMO | Admitting: *Deleted

## 2014-07-17 DIAGNOSIS — I4891 Unspecified atrial fibrillation: Secondary | ICD-10-CM

## 2014-07-17 NOTE — Telephone Encounter (Signed)
Confirmed remote transmission with pt daughter.  

## 2014-07-17 NOTE — Progress Notes (Signed)
Remote pacemaker transmission.   

## 2014-07-18 ENCOUNTER — Ambulatory Visit: Payer: Self-pay | Admitting: Pharmacist Clinician (PhC)/ Clinical Pharmacy Specialist

## 2014-07-18 DIAGNOSIS — Z7901 Long term (current) use of anticoagulants: Secondary | ICD-10-CM

## 2014-07-18 DIAGNOSIS — I4891 Unspecified atrial fibrillation: Secondary | ICD-10-CM

## 2014-07-18 NOTE — Patient Instructions (Signed)
Opened in error

## 2014-07-23 LAB — MDC_IDC_ENUM_SESS_TYPE_REMOTE
Brady Statistic RV Percent Paced: 22.2 %
Lead Channel Pacing Threshold Amplitude: 0.625 V
Lead Channel Setting Pacing Amplitude: 2 V
Lead Channel Setting Pacing Pulse Width: 0.4 ms
Lead Channel Setting Sensing Sensitivity: 5.6 mV
MDC IDC MSMT LEADCHNL RV PACING THRESHOLD PULSEWIDTH: 0.4 ms
MDC IDC MSMT LEADCHNL RV SENSING INTR AMPL: 22.4 mV

## 2014-07-29 ENCOUNTER — Encounter: Payer: Self-pay | Admitting: Cardiology

## 2014-07-30 ENCOUNTER — Ambulatory Visit (INDEPENDENT_AMBULATORY_CARE_PROVIDER_SITE_OTHER): Payer: Medicare HMO | Admitting: Pharmacist Clinician (PhC)/ Clinical Pharmacy Specialist

## 2014-07-30 ENCOUNTER — Other Ambulatory Visit: Payer: Self-pay | Admitting: Cardiovascular Disease

## 2014-07-30 DIAGNOSIS — I4891 Unspecified atrial fibrillation: Secondary | ICD-10-CM

## 2014-07-30 DIAGNOSIS — Z7901 Long term (current) use of anticoagulants: Secondary | ICD-10-CM

## 2014-07-30 LAB — PT WITH INR/FINGERSTICK
INR, fingerstick: 3.6 — ABNORMAL HIGH (ref 0.80–1.20)
PT, fingerstick: 43 seconds — ABNORMAL HIGH (ref 10.4–12.5)

## 2014-08-01 ENCOUNTER — Encounter: Payer: Self-pay | Admitting: Cardiovascular Disease

## 2014-08-13 ENCOUNTER — Other Ambulatory Visit: Payer: Self-pay | Admitting: Cardiovascular Disease

## 2014-08-13 LAB — PT WITH INR/FINGERSTICK
INR, fingerstick: 4.5 — ABNORMAL HIGH (ref 0.80–1.20)
PT, fingerstick: 53.4 seconds — ABNORMAL HIGH (ref 10.4–12.5)

## 2014-08-15 ENCOUNTER — Ambulatory Visit (INDEPENDENT_AMBULATORY_CARE_PROVIDER_SITE_OTHER): Payer: Medicare HMO | Admitting: Urology

## 2014-08-15 ENCOUNTER — Ambulatory Visit (INDEPENDENT_AMBULATORY_CARE_PROVIDER_SITE_OTHER): Payer: Medicare HMO | Admitting: Pharmacist Clinician (PhC)/ Clinical Pharmacy Specialist

## 2014-08-15 DIAGNOSIS — N3020 Other chronic cystitis without hematuria: Secondary | ICD-10-CM

## 2014-08-15 DIAGNOSIS — R3129 Other microscopic hematuria: Secondary | ICD-10-CM

## 2014-08-15 DIAGNOSIS — Z7901 Long term (current) use of anticoagulants: Secondary | ICD-10-CM

## 2014-08-15 DIAGNOSIS — I4891 Unspecified atrial fibrillation: Secondary | ICD-10-CM

## 2014-08-27 ENCOUNTER — Other Ambulatory Visit: Payer: Self-pay | Admitting: Cardiovascular Disease

## 2014-08-27 ENCOUNTER — Ambulatory Visit (INDEPENDENT_AMBULATORY_CARE_PROVIDER_SITE_OTHER): Payer: Medicare HMO | Admitting: Pharmacist Clinician (PhC)/ Clinical Pharmacy Specialist

## 2014-08-27 DIAGNOSIS — Z7901 Long term (current) use of anticoagulants: Secondary | ICD-10-CM

## 2014-08-27 DIAGNOSIS — I4891 Unspecified atrial fibrillation: Secondary | ICD-10-CM

## 2014-08-27 LAB — PT WITH INR/FINGERSTICK
INR, fingerstick: 2 — ABNORMAL HIGH (ref 0.80–1.20)
PT, fingerstick: 24.6 seconds — ABNORMAL HIGH (ref 10.4–12.5)

## 2014-08-28 ENCOUNTER — Telehealth: Payer: Self-pay | Admitting: Cardiovascular Disease

## 2014-08-28 NOTE — Telephone Encounter (Signed)
Please call,concerning her lab results. The message was deleted by mistake on yesterday.

## 2014-08-28 NOTE — Telephone Encounter (Signed)
Looks like patient had a protime yesterday. Will forward to [employer] Scientifickristin alvstad

## 2014-08-29 NOTE — Telephone Encounter (Signed)
LMOM again for dtr with directions for warfarin - 3 mg daily.

## 2014-09-10 ENCOUNTER — Other Ambulatory Visit: Payer: Self-pay | Admitting: Cardiovascular Disease

## 2014-09-10 ENCOUNTER — Ambulatory Visit (INDEPENDENT_AMBULATORY_CARE_PROVIDER_SITE_OTHER): Payer: Medicare HMO | Admitting: Pharmacist Clinician (PhC)/ Clinical Pharmacy Specialist

## 2014-09-10 DIAGNOSIS — I4891 Unspecified atrial fibrillation: Secondary | ICD-10-CM

## 2014-09-10 LAB — PT WITH INR/FINGERSTICK
INR, fingerstick: 2.8 — ABNORMAL HIGH (ref 0.80–1.20)
PT, fingerstick: 33.9 seconds — ABNORMAL HIGH (ref 10.4–12.5)

## 2014-09-23 ENCOUNTER — Other Ambulatory Visit: Payer: Self-pay | Admitting: Cardiovascular Disease

## 2014-10-02 ENCOUNTER — Ambulatory Visit (INDEPENDENT_AMBULATORY_CARE_PROVIDER_SITE_OTHER): Payer: Medicare HMO | Admitting: Pharmacist Clinician (PhC)/ Clinical Pharmacy Specialist

## 2014-10-02 ENCOUNTER — Other Ambulatory Visit: Payer: Self-pay | Admitting: Cardiovascular Disease

## 2014-10-02 DIAGNOSIS — I4891 Unspecified atrial fibrillation: Secondary | ICD-10-CM

## 2014-10-02 LAB — PT WITH INR/FINGERSTICK
INR, fingerstick: 2.8 — ABNORMAL HIGH (ref 0.80–1.20)
PT FINGERSTICK: 33.5 s — AB (ref 10.4–12.5)

## 2014-10-12 ENCOUNTER — Emergency Department (HOSPITAL_COMMUNITY)
Admission: EM | Admit: 2014-10-12 | Discharge: 2014-10-12 | Disposition: A | Discharge status: Home / Self Care | Payer: Medicare HMO | Attending: Emergency Medicine | Admitting: Emergency Medicine

## 2014-10-12 ENCOUNTER — Emergency Department (HOSPITAL_COMMUNITY): Payer: Medicare HMO

## 2014-10-12 ENCOUNTER — Encounter (HOSPITAL_COMMUNITY): Payer: Self-pay | Admitting: Emergency Medicine

## 2014-10-12 DIAGNOSIS — Z79899 Other long term (current) drug therapy: Secondary | ICD-10-CM | POA: Diagnosis not present

## 2014-10-12 DIAGNOSIS — Z7901 Long term (current) use of anticoagulants: Secondary | ICD-10-CM | POA: Insufficient documentation

## 2014-10-12 DIAGNOSIS — M25552 Pain in left hip: Secondary | ICD-10-CM | POA: Diagnosis present

## 2014-10-12 DIAGNOSIS — R52 Pain, unspecified: Secondary | ICD-10-CM

## 2014-10-12 DIAGNOSIS — I4891 Unspecified atrial fibrillation: Secondary | ICD-10-CM | POA: Diagnosis not present

## 2014-10-12 DIAGNOSIS — Z792 Long term (current) use of antibiotics: Secondary | ICD-10-CM | POA: Insufficient documentation

## 2014-10-12 DIAGNOSIS — I10 Essential (primary) hypertension: Secondary | ICD-10-CM | POA: Diagnosis not present

## 2014-10-12 DIAGNOSIS — Z950 Presence of cardiac pacemaker: Secondary | ICD-10-CM | POA: Insufficient documentation

## 2014-10-12 DIAGNOSIS — E785 Hyperlipidemia, unspecified: Secondary | ICD-10-CM | POA: Insufficient documentation

## 2014-10-12 DIAGNOSIS — F0390 Unspecified dementia without behavioral disturbance: Secondary | ICD-10-CM | POA: Insufficient documentation

## 2014-10-12 DIAGNOSIS — K219 Gastro-esophageal reflux disease without esophagitis: Secondary | ICD-10-CM | POA: Insufficient documentation

## 2014-10-12 DIAGNOSIS — Z880 Allergy status to penicillin: Secondary | ICD-10-CM | POA: Insufficient documentation

## 2014-10-12 LAB — COMPREHENSIVE METABOLIC PANEL
ALT: 15 U/L (ref 0–35)
AST: 24 U/L (ref 0–37)
Albumin: 3.1 g/dL — ABNORMAL LOW (ref 3.5–5.2)
Alkaline Phosphatase: 84 U/L (ref 39–117)
Anion gap: 11 (ref 5–15)
BUN: 26 mg/dL — ABNORMAL HIGH (ref 6–23)
CALCIUM: 8.9 mg/dL (ref 8.4–10.5)
CHLORIDE: 105 meq/L (ref 96–112)
CO2: 28 meq/L (ref 19–32)
CREATININE: 0.93 mg/dL (ref 0.50–1.10)
GFR, EST AFRICAN AMERICAN: 63 mL/min — AB (ref >90)
GFR, EST NON AFRICAN AMERICAN: 54 mL/min — AB (ref >90)
GLUCOSE: 86 mg/dL (ref 70–99)
Potassium: 4.2 mEq/L (ref 3.7–5.3)
Sodium: 144 mEq/L (ref 137–147)
Total Bilirubin: 0.3 mg/dL (ref 0.3–1.2)
Total Protein: 6.7 g/dL (ref 6.0–8.3)

## 2014-10-12 LAB — CBC WITH DIFFERENTIAL/PLATELET
Basophils Absolute: 0 10*3/uL (ref 0.0–0.1)
Basophils Relative: 1 % (ref 0–1)
EOS PCT: 3 % (ref 0–5)
Eosinophils Absolute: 0.2 10*3/uL (ref 0.0–0.7)
HCT: 40.3 % (ref 36.0–46.0)
HEMOGLOBIN: 13.1 g/dL (ref 12.0–15.0)
LYMPHS ABS: 1.6 10*3/uL (ref 0.7–4.0)
LYMPHS PCT: 27 % (ref 12–46)
MCH: 30.6 pg (ref 26.0–34.0)
MCHC: 32.5 g/dL (ref 30.0–36.0)
MCV: 94.2 fL (ref 78.0–100.0)
Monocytes Absolute: 0.8 10*3/uL (ref 0.1–1.0)
Monocytes Relative: 14 % — ABNORMAL HIGH (ref 3–12)
Neutro Abs: 3.3 10*3/uL (ref 1.7–7.7)
Neutrophils Relative %: 57 % (ref 43–77)
Platelets: 194 10*3/uL (ref 150–400)
RBC: 4.28 MIL/uL (ref 3.87–5.11)
RDW: 15.1 % (ref 11.5–15.5)
WBC: 5.9 10*3/uL (ref 4.0–10.5)

## 2014-10-12 LAB — PROTIME-INR
INR: 2.3 — ABNORMAL HIGH (ref 0.00–1.49)
PROTHROMBIN TIME: 25.5 s — AB (ref 11.6–15.2)

## 2014-10-12 MED ORDER — HYDROCODONE-ACETAMINOPHEN 5-325 MG PO TABS: 1.0000 | ORAL_TABLET | Freq: Four times a day (QID) | ORAL | Status: DC | PRN

## 2014-10-12 MED ORDER — HYDROMORPHONE HCL 1 MG/ML IJ SOLN
1.0000 mg | Freq: Once | INTRAMUSCULAR | Status: AC
  Administered 2014-10-12: 1 mg via INTRAVENOUS
  Filled 2014-10-12: qty 1

## 2014-10-12 MED ORDER — ONDANSETRON HCL 4 MG/2ML IJ SOLN
4.0000 mg | Freq: Once | INTRAMUSCULAR | Status: AC
  Administered 2014-10-12: 4 mg via INTRAVENOUS
  Filled 2014-10-12: qty 2

## 2014-10-12 NOTE — Discharge Instructions (Signed)
Try extra strength tylenol for pain initially.  Follow up with your md this week.

## 2014-10-12 NOTE — ED Notes (Signed)
Patient with no complaints at this time. Respirations even and unlabored. Skin warm/dry. Discharge instructions reviewed with patient at this time. Patient given opportunity to voice concerns/ask questions. IV removed per policy and band-aid applied to site. Patient discharged at this time and left Emergency Department via wheelchair.  

## 2014-10-12 NOTE — ED Notes (Signed)
Patient c/o lower left back pain and left hip pain that started last night. Patient denies any known injuries. Per patient worse with movement.

## 2014-10-12 NOTE — ED Provider Notes (Signed)
CSN: [id number]     Arrival date & time 10/12/14  1057 History  This chart was scribed for [name] Aye, MD by Gwenyth Oberatherine Macek, ED Scribe. This patient was seen in room APA09/[facility]09 and the patient's care was started at 1:05 PM.     Chief Complaint  Patient presents with  . Back Pain  . Hip Pain   Patient is a 80 y.o. female presenting with hip pain. The history is provided by the patient and a relative. No language interpreter was used.  Hip Pain This is a new problem. The current episode started 6 to 12 hours ago. The problem occurs constantly. The problem has not changed since onset.Pertinent negatives include no chest pain and no abdominal pain. The symptoms are aggravated by walking. Nothing relieves the symptoms.   HPI Comments: [name] is a 80 y.o. female who presents to the Emergency Department complaining of constant, moderate left lower back pain and left hip pain that started last night. She notes that pain is worse with movement. Pt lives alone and denies any injuries. Her daughter reports that she is currently on coumadin. Her daughter also notes that she injured her left knee a few months ago which has made walking difficult. She denies abdominal pain as an associated symptom.  Past Medical History  Diagnosis Date  . Dementia   . Pacemaker   . Hypertension   . Acid reflux   . Atrial fibrillation   . Symptomatic bradycardia   . Hyperlipidemia    Past Surgical History  Procedure Laterality Date  . Pacemaker insertion     Family History  Problem Relation Age of Onset  . Cancer Other    History  Substance Use Topics  . Smoking status: Never Smoker   . Smokeless tobacco: Never Used  . Alcohol Use: No   OB History    No data available     Review of Systems  Constitutional: Negative for appetite change and fatigue.  HENT: Negative for congestion, ear discharge and sinus pressure.   Eyes: Negative for discharge.  Respiratory: Negative for cough.    Cardiovascular: Negative for chest pain.  Gastrointestinal: Negative for abdominal pain and diarrhea.  Genitourinary: Negative for frequency and hematuria.  Musculoskeletal: Positive for arthralgias.  Skin: Negative for rash.  Neurological: Negative for seizures.  Psychiatric/Behavioral: Negative for hallucinations.   Allergies  Contrast media and Penicillins  Home Medications   Prior to Admission medications   Medication Sig Start Date End Date Taking? Authorizing Provider  cephALEXin (KEFLEX) 500 MG capsule Take 1 capsule (500 mg total) by mouth 4 (four) times daily. Patient taking differently: Take 500 mg by mouth at bedtime.  11/18/13  Yes Gilda Creasehristopher J. Pollina, MD  cholecalciferol (VITAMIN D) 1000 UNITS tablet Take 1,000 Units by mouth daily.   Yes Historical Provider, MD  furosemide (LASIX) 40 MG tablet Take 20-40 tablets by mouth 2 (two) times daily.  09/25/14  Yes Historical Provider, MD  Multiple Vitamins-Minerals (CENTRUM SILVER PO) Take 1 tablet by mouth daily.     Yes Historical Provider, MD  omeprazole (PRILOSEC) 20 MG capsule Take 20 mg by mouth daily.     Yes Historical Provider, MD  potassium chloride SA (K-DUR,KLOR-CON) 20 MEQ tablet Take 1 tablet by mouth daily. 09/24/14  Yes Historical Provider, MD  simvastatin (ZOCOR) 20 MG tablet Take 10 mg by mouth every evening.   Yes Historical Provider, MD  tetrahydrozoline (EYE DROPS) 0.05 % ophthalmic solution Place 1 drop into  both eyes daily as needed. For dry eye relief   Yes Historical Provider, MD  Travoprost, BAK Free, (TRAVATAMN) 0.004 % SOLN ophthalmic solution Place 1 drop into both eyes at bedtime.    Yes Historical Provider, MD  traZODone (DESYREL) 50 MG tablet Take 50 mg by mouth at bedtime.   Yes Historical Provider, MD  warfarin (COUMADIN) 3 MG tablet Take 3 mg by mouth daily.   Yes Historical Provider, MD  metoprolol tartrate (LOPRESSOR) 25 MG tablet Take 25 mg by mouth every evening.  11/14/13   [name],  MD  warfarin (COUMADIN) 3 MG tablet TAKE 1 TO 1 AND 1/2 TABLET BY MOUTH DAILY AS DIRECTED. Patient not taking: Reported on 10/12/2014 09/23/14   [name], MD   BP 128/42 mmHg  Pulse 67  Temp(Src) 98.2 F (36.8 C) (Oral)  Resp 18  Wt 158 lb (71.668 kg)  SpO2 96% Physical Exam  Constitutional: She is oriented to person, place, and time. She appears well-developed.  HENT:  Head: Normocephalic.  Eyes: Conjunctivae and EOM are normal. No scleral icterus.  Neck: Neck supple. No thyromegaly present.  Cardiovascular: Normal rate and regular rhythm.  Exam reveals no gallop and no friction rub.   No murmur heard. Pulmonary/Chest: No stridor. She has no wheezes. She has no rales. She exhibits no tenderness.  Abdominal: She exhibits no distension. There is no tenderness. There is no rebound.  Musculoskeletal: Normal range of motion. She exhibits tenderness. She exhibits no edema.  Moderate left hip tenderness  Lymphadenopathy:    She has no cervical adenopathy.  Neurological: She is oriented to person, place, and time. She exhibits normal muscle tone. Coordination normal.  Skin: No rash noted. No erythema.  Psychiatric: She has a normal mood and affect. Her behavior is normal.  Nursing note and vitals reviewed.   ED Course  Procedures (including critical care time)  DIAGNOSTIC STUDIES: Oxygen Saturation is 96% on RA, normal by my interpretation.    COORDINATION OF CARE: 1:10 PM Discussed treatment plan with pt at bedside and pt agreed to plan.  Labs Review Labs Reviewed - No data to display  Imaging Review No results found.   EKG Interpretation None      MDM   Final diagnoses:  None    Pt improved with dilaudid shot.  Will treat with percocet and close follow up  The chart was scribed for me under my direct supervision.  I personally performed the history, physical, and medical decision making and all procedures in the evaluation of this patient.[name].   Puja Caffey L  Tazaria Dlugosz, MD 10/12/14 [phone]

## 2014-10-20 ENCOUNTER — Ambulatory Visit: Payer: Medicare HMO | Admitting: *Deleted

## 2014-10-20 DIAGNOSIS — I4891 Unspecified atrial fibrillation: Secondary | ICD-10-CM

## 2014-10-20 NOTE — Progress Notes (Signed)
Remote pacemaker transmission.   

## 2014-10-28 ENCOUNTER — Ambulatory Visit (INDEPENDENT_AMBULATORY_CARE_PROVIDER_SITE_OTHER): Payer: Medicare HMO | Admitting: *Deleted

## 2014-10-28 ENCOUNTER — Ambulatory Visit (INDEPENDENT_AMBULATORY_CARE_PROVIDER_SITE_OTHER): Payer: Medicare HMO | Admitting: Cardiovascular Disease

## 2014-10-28 ENCOUNTER — Encounter: Payer: Self-pay | Admitting: Cardiovascular Disease

## 2014-10-28 VITALS — BP 132/68 | HR 71 | Resp 20 | Ht 61.0 in | Wt 164.1 lb

## 2014-10-28 DIAGNOSIS — I4891 Unspecified atrial fibrillation: Secondary | ICD-10-CM

## 2014-10-28 DIAGNOSIS — I340 Nonrheumatic mitral (valve) insufficiency: Secondary | ICD-10-CM

## 2014-10-28 DIAGNOSIS — Z7901 Long term (current) use of anticoagulants: Secondary | ICD-10-CM

## 2014-10-28 DIAGNOSIS — Z950 Presence of cardiac pacemaker: Secondary | ICD-10-CM

## 2014-10-28 DIAGNOSIS — R001 Bradycardia, unspecified: Secondary | ICD-10-CM

## 2014-10-28 DIAGNOSIS — E785 Hyperlipidemia, unspecified: Secondary | ICD-10-CM

## 2014-10-28 LAB — MDC_IDC_ENUM_SESS_TYPE_INCLINIC
Battery Remaining Longevity: 160 mo
Brady Statistic RV Percent Paced: 23 %
Date Time Interrogation Session: 20151201172625
Lead Channel Impedance Value: 67 Ohm
Lead Channel Impedance Value: 718 Ohm
Lead Channel Pacing Threshold Amplitude: 0.5 V
Lead Channel Pacing Threshold Pulse Width: 0.4 ms
Lead Channel Sensing Intrinsic Amplitude: 15.67 mV
Lead Channel Setting Pacing Amplitude: 2 V
Lead Channel Setting Pacing Pulse Width: 0.4 ms
MDC IDC MSMT BATTERY IMPEDANCE: 100 Ohm
MDC IDC MSMT BATTERY VOLTAGE: 2.8 V
MDC IDC SET LEADCHNL RV SENSING SENSITIVITY: 5.6 mV

## 2014-10-28 LAB — POCT INR: INR: 2.8

## 2014-10-28 MED ORDER — DONEPEZIL HCL 10 MG PO TABS: 10.0000 mg | ORAL_TABLET | Freq: Every day | ORAL | Status: DC

## 2014-10-28 MED ORDER — METOPROLOL SUCCINATE ER 25 MG PO TB24: 25.0000 mg | ORAL_TABLET | Freq: Every day | ORAL | Status: DC

## 2014-10-28 NOTE — Patient Instructions (Signed)
Your physician recommends that you schedule a follow-up appointment in: 3 Months remote pacer check and 12 Months with Dr Royann Shiversroitoru  Your physician has recommended you make the following change in your medication: Discontinue Metoprolol Tart and Start Metoprolol Succinate 25 mg at bedtime, START Aricept 10 mg daily

## 2014-10-29 ENCOUNTER — Encounter: Payer: Self-pay | Admitting: Cardiovascular Disease

## 2014-10-29 NOTE — Progress Notes (Signed)
Patient ID: [name], female   DOB: 03-05-1927, 80 y.o.   MRN: [id number]     Reason for office visit Permanent atrial fibrillation with slow ventricular response, pacemaker follow-up  [name] is here accompanied as always by her daughter. She is feeling well.   Initially received a pacemaker in 2007 for symptomatic atrial fibrillation slow ventricular response, generator change in 2014 ([employer] [place]). She does not have a history of stroke or TIA. She is on chronic warfarin anticoagulation and this has generally been fairly steady without bleeding complications or need for dosing changes. She is steady on her feet usually, but they had a fall in late 2014 precipitated by UTI and diarrhea. She has not had any falls since then. INR is 2.8 today.  She has little in the way of structural heart disease. She had minor coronary atherosclerosis by catheterization in 2005 and had a negative nuclear perfusion study in 2009. She has mitral insufficiency that has been variably estimated to be mild to moderate, moderate or moderate to severe on echocardiograms over the years. Left ventricular ejection fraction has been normal at around 55% although her left ventricle is mildly dilated.   Pacemaker interrogation today shows normal device function. Ventricular rate control is fair. Heart rates over 100 bpm I recorded about 30% of the time. Her dose of beta blocker was reduced a couple of years ago because she had severe orthostatic dizziness.  Her memory is deteriorating rapidly and her daughter would like her to restart treatment with Aricept. Not clear why this was stopped.   Allergies  Allergen Reactions  . Contrast Media [Iodinated Diagnostic Agents] Other (See Comments)    "shaking"  . Penicillins Other (See Comments)    REACTION: Unknown    Current Outpatient Prescriptions  Medication Sig Dispense Refill  . cephALEXin (KEFLEX) 500 MG capsule Take 1 capsule (500 mg total) by mouth 4  (four) times daily. (Patient taking differently: Take 500 mg by mouth at bedtime. ) 40 capsule 0  . cholecalciferol (VITAMIN D) 1000 UNITS tablet Take 1,000 Units by mouth daily.    . furosemide (LASIX) 40 MG tablet Take 20-40 tablets by mouth 2 (two) times daily.     Marland Kitchen. HYDROcodone-acetaminophen (NORCO/VICODIN) 5-325 MG per tablet Take 1 tablet by mouth every 6 (six) hours as needed. 20 tablet 0  . Multiple Vitamins-Minerals (CENTRUM SILVER PO) Take 1 tablet by mouth daily.      Marland Kitchen. omeprazole (PRILOSEC) 20 MG capsule Take 20 mg by mouth daily.      . potassium chloride SA (K-DUR,KLOR-CON) 20 MEQ tablet Take 1 tablet by mouth daily.    . simvastatin (ZOCOR) 20 MG tablet Take 10 mg by mouth every evening.    Marland Kitchen. tetrahydrozoline (EYE DROPS) 0.05 % ophthalmic solution Place 1 drop into both eyes daily as needed. For dry eye relief    . Travoprost, BAK Free, (TRAVATAMN) 0.004 % SOLN ophthalmic solution Place 1 drop into both eyes at bedtime.     . traZODone (DESYREL) 50 MG tablet Take 50 mg by mouth at bedtime.    Marland Kitchen. warfarin (COUMADIN) 3 MG tablet TAKE 1 TO 1 AND 1/2 TABLET BY MOUTH DAILY AS DIRECTED. 120 tablet 0  . warfarin (COUMADIN) 3 MG tablet Take 3 mg by mouth daily.    Marland Kitchen. donepezil (ARICEPT) 10 MG tablet Take 1 tablet (10 mg total) by mouth at bedtime. 30 tablet 6  . metoprolol succinate (TOPROL XL) 25 MG 24 hr tablet Take  1 tablet (25 mg total) by mouth daily. 30 tablet 6   No current facility-administered medications for this visit.    Past Medical History  Diagnosis Date  . Dementia   . Pacemaker   . Hypertension   . Acid reflux   . Atrial fibrillation   . Symptomatic bradycardia   . Hyperlipidemia     Past Surgical History  Procedure Laterality Date  . Pacemaker insertion      Family History  Problem Relation Age of Onset  . Cancer Other     History   Social History  . Marital Status: Widowed    Spouse Name: N/A    Number of Children: N/A  . Years of Education: N/A     Occupational History  . Not on file.   Social History Main Topics  . Smoking status: Never Smoker   . Smokeless tobacco: Never Used  . Alcohol Use: No  . Drug Use: No  . Sexual Activity: No   Other Topics Concern  . Not on file   Social History Narrative    Review of systems: The patient specifically denies any chest pain at rest or with exertion, dyspnea at rest or with exertion, orthopnea, paroxysmal nocturnal dyspnea, syncope, palpitations, focal neurological deficits, intermittent claudication, lower extremity edema, unexplained weight gain, cough, hemoptysis or wheezing.  The patient also denies abdominal pain, nausea, vomiting, dysphagia, diarrhea, constipation, polyuria, polydipsia, dysuria, hematuria, frequency, urgency, abnormal bleeding or bruising, fever, chills, unexpected weight changes, mood swings, change in skin or hair texture, change in voice quality, auditory or visual problems, allergic reactions or rashes, new musculoskeletal complaints other than usual "aches and pains".  PHYSICAL EXAM BP 132/68 mmHg  Pulse 71  Resp 20  Ht 5\' 1"  (1.549 m)  Wt 164 lb 1.6 oz (74.435 kg)  BMI 31.02 kg/m2 General: Alert, oriented x3, no distress Head: no evidence of trauma, PERRL, EOMI, no exophtalmos or lid lag, no myxedema, no xanthelasma; normal ears, nose and oropharynx Neck: normal jugular venous pulsations and no hepatojugular reflux; brisk carotid pulses without delay and no carotid bruits Chest: clear to auscultation, no signs of consolidation by percussion or palpation, normal fremitus, symmetrical and full respiratory excursions; left subclavian pacemaker site Cardiovascular: normal position and quality of the apical impulse, irregular rhythm, normal first and widely split second heart sounds, no murmurs, rubs or gallops Abdomen: no tenderness or distention, no masses by palpation, no abnormal pulsatility or arterial bruits, normal bowel sounds, no  hepatosplenomegaly Extremities: no clubbing, cyanosis or edema; 2+ radial, ulnar and brachial pulses bilaterally; 2+ right femoral, posterior tibial and dorsalis pedis pulses; 2+ left femoral, posterior tibial and dorsalis pedis pulses; no subclavian or femoral bruits Neurological: grossly nonfocal  EKG:  Atrial fibrillation with intermittent ventricular pacing, incomplete right bundle branch block, diffuse ST depression and T-wave inversion in the anterior precordial leads, inferior leads  BMET    Component Value Date/Time   NA 144 10/12/2014 1322   K 4.2 10/12/2014 1322   CL 105 10/12/2014 1322   CO2 28 10/12/2014 1322   GLUCOSE 86 10/12/2014 1322   BUN 26* 10/12/2014 1322   CREATININE 0.93 10/12/2014 1322   CREATININE 0.93 08/13/2013 1218   CALCIUM 8.9 10/12/2014 1322   GFRNONAA 54* 10/12/2014 1322   GFRAA 63* 10/12/2014 1322     ASSESSMENT AND PLAN  Atrial fibrillation Ventricular rate control is there, although not ideal. She has had problems with orthostatic dizziness and I chose not to increase her  beta blocker. She has no signs or symptoms of congestive heart failure think keeping the ventricular rate target of under 110 bpm is acceptable. Continue full dose anticoagulation. She has not had any bleeding problems. As far as I can tell she does not have a stroke or TIA history, but she is elderly and has systemic hypertension.  Pacemaker - Medtronic 2007, new generator 2014 Device function is normal. There is a low prevalence of ventricular pacing around 23%.  Lead parameters are excellent. No device reprogramming was performed today.  Dementia We discussed the fact that cholinesterase inhibitors may delay the onset of severe functional disability, but did not really slow down the disease process. I did go ahead and prescribed Aricept and asked that she follow-up with her primary care provider.  Orders Placed This Encounter  Procedures  . Implantable device check  . EKG  12-Lead   Meds ordered this encounter  Medications  . metoprolol succinate (TOPROL XL) 25 MG 24 hr tablet    Sig: Take 1 tablet (25 mg total) by mouth daily.    Dispense:  30 tablet    Refill:  6  . donepezil (ARICEPT) 10 MG tablet    Sig: Take 1 tablet (10 mg total) by mouth at bedtime.    Dispense:  30 tablet    Refill:  6    Ido Wollman  Thurmon Fair, MD, [facility] [phone] office [phone] pager

## 2014-11-06 ENCOUNTER — Encounter (HOSPITAL_COMMUNITY): Payer: Self-pay | Admitting: Cardiovascular Disease

## 2014-12-01 ENCOUNTER — Telehealth: Payer: Self-pay | Admitting: Cardiovascular Disease

## 2014-12-01 DIAGNOSIS — I4891 Unspecified atrial fibrillation: Secondary | ICD-10-CM

## 2014-12-01 NOTE — Telephone Encounter (Signed)
Spoke with pt dtr, the lab in [place] where they usually get labs has closed. They need a new order for protime faxed to the pharm on [address]. Fax number [phone]. Order faxed

## 2014-12-01 NOTE — Telephone Encounter (Signed)
Pt needs a new lab order.Please fax it to [facility] on [address] in [place].

## 2014-12-02 ENCOUNTER — Other Ambulatory Visit: Payer: Self-pay | Admitting: Cardiovascular Disease

## 2014-12-02 DIAGNOSIS — Z7901 Long term (current) use of anticoagulants: Secondary | ICD-10-CM | POA: Diagnosis not present

## 2014-12-02 DIAGNOSIS — I4891 Unspecified atrial fibrillation: Secondary | ICD-10-CM | POA: Diagnosis not present

## 2014-12-02 LAB — PROTIME-INR
INR: 2.52 — ABNORMAL HIGH (ref <1.50)
Prothrombin Time: 27.2 seconds — ABNORMAL HIGH (ref 11.6–15.2)

## 2014-12-04 ENCOUNTER — Ambulatory Visit (INDEPENDENT_AMBULATORY_CARE_PROVIDER_SITE_OTHER): Payer: Medicare HMO | Admitting: Pharmacist Clinician (PhC)/ Clinical Pharmacy Specialist

## 2014-12-04 DIAGNOSIS — I4891 Unspecified atrial fibrillation: Secondary | ICD-10-CM

## 2014-12-04 DIAGNOSIS — Z7901 Long term (current) use of anticoagulants: Secondary | ICD-10-CM

## 2014-12-23 DIAGNOSIS — I10 Essential (primary) hypertension: Secondary | ICD-10-CM | POA: Diagnosis not present

## 2014-12-23 DIAGNOSIS — H524 Presbyopia: Secondary | ICD-10-CM | POA: Diagnosis not present

## 2014-12-23 DIAGNOSIS — I5022 Chronic systolic (congestive) heart failure: Secondary | ICD-10-CM | POA: Diagnosis not present

## 2014-12-23 DIAGNOSIS — H5210 Myopia, unspecified eye: Secondary | ICD-10-CM | POA: Diagnosis not present

## 2014-12-23 DIAGNOSIS — F0280 Dementia in other diseases classified elsewhere without behavioral disturbance: Secondary | ICD-10-CM | POA: Diagnosis not present

## 2014-12-23 DIAGNOSIS — I480 Paroxysmal atrial fibrillation: Secondary | ICD-10-CM | POA: Diagnosis not present

## 2015-01-12 ENCOUNTER — Telehealth: Payer: Self-pay | Admitting: *Deleted

## 2015-01-12 NOTE — Telephone Encounter (Signed)
Signed standing order for PT/INR faxed to [facility].

## 2015-01-20 DIAGNOSIS — I4891 Unspecified atrial fibrillation: Secondary | ICD-10-CM | POA: Diagnosis not present

## 2015-01-22 ENCOUNTER — Ambulatory Visit (INDEPENDENT_AMBULATORY_CARE_PROVIDER_SITE_OTHER): Payer: Medicare HMO | Admitting: Pharmacist Clinician (PhC)/ Clinical Pharmacy Specialist

## 2015-01-22 ENCOUNTER — Other Ambulatory Visit: Payer: Self-pay | Admitting: Cardiovascular Disease

## 2015-01-22 DIAGNOSIS — Z7901 Long term (current) use of anticoagulants: Secondary | ICD-10-CM

## 2015-01-22 DIAGNOSIS — I4891 Unspecified atrial fibrillation: Secondary | ICD-10-CM

## 2015-01-22 DIAGNOSIS — I5022 Chronic systolic (congestive) heart failure: Secondary | ICD-10-CM | POA: Diagnosis not present

## 2015-01-22 DIAGNOSIS — F0390 Unspecified dementia without behavioral disturbance: Secondary | ICD-10-CM | POA: Diagnosis not present

## 2015-01-22 DIAGNOSIS — I10 Essential (primary) hypertension: Secondary | ICD-10-CM | POA: Diagnosis not present

## 2015-01-22 DIAGNOSIS — I480 Paroxysmal atrial fibrillation: Secondary | ICD-10-CM | POA: Diagnosis not present

## 2015-01-22 LAB — PROTIME-INR
INR: 2 — AB (ref <1.50)
PROTHROMBIN TIME: 22.7 s — AB (ref 11.6–15.2)

## 2015-01-29 ENCOUNTER — Ambulatory Visit (INDEPENDENT_AMBULATORY_CARE_PROVIDER_SITE_OTHER): Payer: Medicare HMO | Admitting: *Deleted

## 2015-01-29 DIAGNOSIS — I4891 Unspecified atrial fibrillation: Secondary | ICD-10-CM

## 2015-01-29 LAB — MDC_IDC_ENUM_SESS_TYPE_REMOTE
Battery Impedance: 101 Ohm
Battery Remaining Longevity: 161 mo
Brady Statistic RV Percent Paced: 24 %
Lead Channel Impedance Value: 67 Ohm
Lead Channel Impedance Value: 736 Ohm
Lead Channel Pacing Threshold Amplitude: 0.625 V
Lead Channel Setting Pacing Amplitude: 2 V
Lead Channel Setting Pacing Pulse Width: 0.4 ms
Lead Channel Setting Sensing Sensitivity: 4 mV
MDC IDC MSMT BATTERY VOLTAGE: 2.8 V
MDC IDC MSMT LEADCHNL RV PACING THRESHOLD PULSEWIDTH: 0.4 ms
MDC IDC MSMT LEADCHNL RV SENSING INTR AMPL: 11.2 mV
MDC IDC SESS DTM: 20160303174723

## 2015-01-29 NOTE — Progress Notes (Signed)
Remote pacemaker transmission.   

## 2015-02-12 ENCOUNTER — Encounter: Payer: Self-pay | Admitting: Cardiology

## 2015-02-19 ENCOUNTER — Telehealth: Payer: Self-pay | Admitting: Cardiovascular Disease

## 2015-02-19 ENCOUNTER — Ambulatory Visit (INDEPENDENT_AMBULATORY_CARE_PROVIDER_SITE_OTHER): Payer: Medicare HMO | Admitting: Pharmacist Clinician (PhC)/ Clinical Pharmacy Specialist

## 2015-02-19 DIAGNOSIS — Z7901 Long term (current) use of anticoagulants: Secondary | ICD-10-CM

## 2015-02-19 DIAGNOSIS — I4891 Unspecified atrial fibrillation: Secondary | ICD-10-CM

## 2015-02-19 LAB — PT WITH INR/FINGERSTICK
INR FINGERSTICK: 2.9 — AB (ref 0.80–1.20)
PT FINGERSTICK: 35.3 s — AB (ref 10.4–12.5)

## 2015-02-19 LAB — PROTIME-INR: INR: 2.9 — AB (ref <=1.1)

## 2015-02-19 NOTE — Telephone Encounter (Signed)
Pt presented to [facility] for monthly INR draw. Order entered. [facility] verified.

## 2015-02-25 ENCOUNTER — Encounter: Payer: Self-pay | Admitting: Cardiovascular Disease

## 2015-02-25 ENCOUNTER — Telehealth: Payer: Self-pay | Admitting: Cardiovascular Disease

## 2015-03-02 ENCOUNTER — Encounter: Payer: Self-pay | Admitting: Cardiovascular Disease

## 2015-03-05 ENCOUNTER — Emergency Department (HOSPITAL_COMMUNITY)
Admission: EM | Admit: 2015-03-05 | Discharge: 2015-03-05 | Disposition: A | Discharge status: Home / Self Care | Payer: Commercial Managed Care - HMO | Attending: Emergency Medicine | Admitting: Emergency Medicine

## 2015-03-05 ENCOUNTER — Encounter (HOSPITAL_COMMUNITY): Payer: Self-pay

## 2015-03-05 ENCOUNTER — Emergency Department (HOSPITAL_COMMUNITY): Payer: Commercial Managed Care - HMO

## 2015-03-05 DIAGNOSIS — Z8742 Personal history of other diseases of the female genital tract: Secondary | ICD-10-CM | POA: Insufficient documentation

## 2015-03-05 DIAGNOSIS — K5900 Constipation, unspecified: Secondary | ICD-10-CM | POA: Diagnosis not present

## 2015-03-05 DIAGNOSIS — F0390 Unspecified dementia without behavioral disturbance: Secondary | ICD-10-CM | POA: Insufficient documentation

## 2015-03-05 DIAGNOSIS — Z792 Long term (current) use of antibiotics: Secondary | ICD-10-CM | POA: Diagnosis not present

## 2015-03-05 DIAGNOSIS — Z880 Allergy status to penicillin: Secondary | ICD-10-CM | POA: Insufficient documentation

## 2015-03-05 DIAGNOSIS — Z79899 Other long term (current) drug therapy: Secondary | ICD-10-CM | POA: Diagnosis not present

## 2015-03-05 DIAGNOSIS — Z7901 Long term (current) use of anticoagulants: Secondary | ICD-10-CM | POA: Diagnosis not present

## 2015-03-05 DIAGNOSIS — Z87440 Personal history of urinary (tract) infections: Secondary | ICD-10-CM | POA: Diagnosis not present

## 2015-03-05 DIAGNOSIS — K219 Gastro-esophageal reflux disease without esophagitis: Secondary | ICD-10-CM | POA: Diagnosis not present

## 2015-03-05 DIAGNOSIS — R0789 Other chest pain: Secondary | ICD-10-CM | POA: Diagnosis not present

## 2015-03-05 DIAGNOSIS — E785 Hyperlipidemia, unspecified: Secondary | ICD-10-CM | POA: Insufficient documentation

## 2015-03-05 DIAGNOSIS — Z872 Personal history of diseases of the skin and subcutaneous tissue: Secondary | ICD-10-CM | POA: Insufficient documentation

## 2015-03-05 DIAGNOSIS — Z950 Presence of cardiac pacemaker: Secondary | ICD-10-CM | POA: Diagnosis not present

## 2015-03-05 DIAGNOSIS — R079 Chest pain, unspecified: Secondary | ICD-10-CM

## 2015-03-05 DIAGNOSIS — I10 Essential (primary) hypertension: Secondary | ICD-10-CM | POA: Insufficient documentation

## 2015-03-05 HISTORY — DX: Straining to void: R39.16

## 2015-03-05 HISTORY — DX: Urinary tract infection, site not specified: N39.0

## 2015-03-05 HISTORY — DX: Other microscopic hematuria: R31.29

## 2015-03-05 HISTORY — DX: Other chronic cystitis without hematuria: N30.20

## 2015-03-05 HISTORY — DX: Urge incontinence: N39.41

## 2015-03-05 HISTORY — DX: Pressure ulcer of unspecified buttock, unspecified stage: L89.309

## 2015-03-05 HISTORY — DX: Postmenopausal atrophic vaginitis: N95.2

## 2015-03-05 HISTORY — DX: Retention of urine, unspecified: R33.9

## 2015-03-05 LAB — CBC WITH DIFFERENTIAL/PLATELET
Basophils Absolute: 0 10*3/uL (ref 0.0–0.1)
Basophils Relative: 0 % (ref 0–1)
EOS PCT: 3 % (ref 0–5)
Eosinophils Absolute: 0.2 10*3/uL (ref 0.0–0.7)
HEMATOCRIT: 42.3 % (ref 36.0–46.0)
HEMOGLOBIN: 13.3 g/dL (ref 12.0–15.0)
LYMPHS ABS: 1.7 10*3/uL (ref 0.7–4.0)
LYMPHS PCT: 24 % (ref 12–46)
MCH: 29.9 pg (ref 26.0–34.0)
MCHC: 31.4 g/dL (ref 30.0–36.0)
MCV: 95.1 fL (ref 78.0–100.0)
MONOS PCT: 7 % (ref 3–12)
Monocytes Absolute: 0.5 10*3/uL (ref 0.1–1.0)
Neutro Abs: 4.7 10*3/uL (ref 1.7–7.7)
Neutrophils Relative %: 66 % (ref 43–77)
Platelets: 186 10*3/uL (ref 150–400)
RBC: 4.45 MIL/uL (ref 3.87–5.11)
RDW: 15.2 % (ref 11.5–15.5)
WBC: 7 10*3/uL (ref 4.0–10.5)

## 2015-03-05 LAB — PROTIME-INR
INR: 2.43 — ABNORMAL HIGH (ref 0.00–1.49)
Prothrombin Time: 26.6 seconds — ABNORMAL HIGH (ref 11.6–15.2)

## 2015-03-05 LAB — BASIC METABOLIC PANEL
ANION GAP: 6 (ref 5–15)
BUN: 27 mg/dL — ABNORMAL HIGH (ref 6–23)
CO2: 30 mmol/L (ref 19–32)
CREATININE: 1.05 mg/dL (ref 0.50–1.10)
Calcium: 8.8 mg/dL (ref 8.4–10.5)
Chloride: 111 mmol/L (ref 96–112)
GFR calc Af Amer: 55 mL/min — ABNORMAL LOW (ref >90)
GFR calc non Af Amer: 47 mL/min — ABNORMAL LOW (ref >90)
Glucose, Bld: 117 mg/dL — ABNORMAL HIGH (ref 70–99)
Potassium: 4.1 mmol/L (ref 3.5–5.1)
SODIUM: 147 mmol/L — AB (ref 135–145)

## 2015-03-05 LAB — TROPONIN I: Troponin I: <0.03 ng/mL (ref <0.031)

## 2015-03-05 NOTE — Discharge Instructions (Signed)
Use an antacid like Maalox or Mylanta 4 times a day as needed for indigestion. Call your cardiologist in the morning to report the pacemaker abnormality that was found in testing today. Use MiraLAX, twice a day for 2 weeks, and then once a day to keep your stools soft. Follow-up with your primary care doctor next week for checkup.     Chest Pain (Nonspecific) It is often hard to give a specific diagnosis for the cause of chest pain. There is always a chance that your pain could be related to something serious, such as a heart attack or a blood clot in the lungs. You need to follow up with your health care provider for further evaluation. CAUSES   Heartburn.  Pneumonia or bronchitis.  Anxiety or stress.  Inflammation around your heart (pericarditis) or lung (pleuritis or pleurisy).  A blood clot in the lung.  A collapsed lung (pneumothorax). It can develop suddenly on its own (spontaneous pneumothorax) or from trauma to the chest.  Shingles infection (herpes zoster virus). The chest wall is composed of bones, muscles, and cartilage. Any of these can be the source of the pain.  The bones can be bruised by injury.  The muscles or cartilage can be strained by coughing or overwork.  The cartilage can be affected by inflammation and become sore (costochondritis). DIAGNOSIS  Lab tests or other studies may be needed to find the cause of your pain. Your health care provider may have you take a test called an ambulatory electrocardiogram (ECG). An ECG records your heartbeat patterns over a 24-hour period. You may also have other tests, such as:  Transthoracic echocardiogram (TTE). During echocardiography, sound waves are used to evaluate how blood flows through your heart.  Transesophageal echocardiogram (TEE).  Cardiac monitoring. This allows your health care provider to monitor your heart rate and rhythm in real time.  Holter monitor. This is a portable device that records your  heartbeat and can help diagnose heart arrhythmias. It allows your health care provider to track your heart activity for several days, if needed.  Stress tests by exercise or by giving medicine that makes the heart beat faster. TREATMENT   Treatment depends on what may be causing your chest pain. Treatment may include:  Acid blockers for heartburn.  Anti-inflammatory medicine.  Pain medicine for inflammatory conditions.  Antibiotics if an infection is present.  You may be advised to change lifestyle habits. This includes stopping smoking and avoiding alcohol, caffeine, and chocolate.  You may be advised to keep your head raised (elevated) when sleeping. This reduces the chance of acid going backward from your stomach into your esophagus. Most of the time, nonspecific chest pain will improve within 2-3 days with rest and mild pain medicine.  HOME CARE INSTRUCTIONS   If antibiotics were prescribed, take them as directed. Finish them even if you start to feel better.  For the next few days, avoid physical activities that bring on chest pain. Continue physical activities as directed.  Do not use any tobacco products, including cigarettes, chewing tobacco, or electronic cigarettes.  Avoid drinking alcohol.  Only take medicine as directed by your health care provider.  Follow your health care provider's suggestions for further testing if your chest pain does not go away.  Keep any follow-up appointments you made. If you do not go to an appointment, you could develop lasting (chronic) problems with pain. If there is any problem keeping an appointment, call to reschedule. SEEK MEDICAL CARE IF:  Your chest pain does not go away, even after treatment.  You have a rash with blisters on your chest.  You have a fever. SEEK IMMEDIATE MEDICAL CARE IF:   You have increased chest pain or pain that spreads to your arm, neck, jaw, back, or abdomen.  You have shortness of breath.  You have  an increasing cough, or you cough up blood.  You have severe back or abdominal pain.  You feel nauseous or vomit.  You have severe weakness.  You faint.  You have chills. This is an emergency. Do not wait to see if the pain will go away. Get medical help at once. Call your local emergency services (911 in [place] Do not drive yourself to the hospital. MAKE SURE YOU:   Understand these instructions.  Will watch your condition.  Will get help right away if you are not doing well or get worse. Document Released: 08/24/2005 Document Revised: 11/19/2013 Document Reviewed: 06/19/2008 [facility] Patient Information 2015 [place], [place]. This information is not intended to replace advice given to you by your health care provider. Make sure you discuss any questions you have with your health care provider.  Constipation Constipation is when a person has fewer than three bowel movements a week, has difficulty having a bowel movement, or has stools that are dry, hard, or larger than normal. As people grow older, constipation is more common. If you try to fix constipation with medicines that make you have a bowel movement (laxatives), the problem may get worse. Long-term laxative use may cause the muscles of the colon to become weak. A low-fiber diet, not taking in enough fluids, and taking certain medicines may make constipation worse.  CAUSES   Certain medicines, such as antidepressants, pain medicine, iron supplements, antacids, and water pills.   Certain diseases, such as diabetes, irritable bowel syndrome (IBS), thyroid disease, or depression.   Not drinking enough water.   Not eating enough fiber-rich foods.   Stress or travel.   Lack of physical activity or exercise.   Ignoring the urge to have a bowel movement.   Using laxatives too much.  SIGNS AND SYMPTOMS   Having fewer than three bowel movements a week.   Straining to have a bowel movement.   Having stools that  are hard, dry, or larger than normal.   Feeling full or bloated.   Pain in the lower abdomen.   Not feeling relief after having a bowel movement.  DIAGNOSIS  Your health care provider will take a medical history and perform a physical exam. Further testing may be done for severe constipation. Some tests may include:  A barium enema X-ray to examine your rectum, colon, and, sometimes, your small intestine.   A sigmoidoscopy to examine your lower colon.   A colonoscopy to examine your entire colon. TREATMENT  Treatment will depend on the severity of your constipation and what is causing it. Some dietary treatments include drinking more fluids and eating more fiber-rich foods. Lifestyle treatments may include regular exercise. If these diet and lifestyle recommendations do not help, your health care provider may recommend taking over-the-counter laxative medicines to help you have bowel movements. Prescription medicines may be prescribed if over-the-counter medicines do not work.  HOME CARE INSTRUCTIONS   Eat foods that have a lot of fiber, such as fruits, vegetables, whole grains, and beans.  Limit foods high in fat and processed sugars, such as french fries, hamburgers, cookies, candies, and soda.   A fiber supplement may  be added to your diet if you cannot get enough fiber from foods.   Drink enough fluids to keep your urine clear or pale yellow.   Exercise regularly or as directed by your health care provider.   Go to the restroom when you have the urge to go. Do not hold it.   Only take over-the-counter or prescription medicines as directed by your health care provider. Do not take other medicines for constipation without talking to your health care provider first.  SEEK IMMEDIATE MEDICAL CARE IF:   You have bright red blood in your stool.   Your constipation lasts for more than 4 days or gets worse.   You have abdominal or rectal pain.   You have thin,  pencil-like stools.   You have unexplained weight loss. MAKE SURE YOU:   Understand these instructions.  Will watch your condition.  Will get help right away if you are not doing well or get worse. Document Released: 08/12/2004 Document Revised: 11/19/2013 Document Reviewed: 08/26/2013 [facility] Patient Information 2015 [place], [place]. This information is not intended to replace advice given to you by your health care provider. Make sure you discuss any questions you have with your health care provider.

## 2015-03-05 NOTE — ED Notes (Addendum)
EMS reports pt c/o chest pain and nausea x 1 hour.  EMS reports pt had run of vtach while being monitored but went away on its own.  Reports pt has history of dementia.  EMS placed 20g IV in r wrist, cbg 127 per ems.  GCS 14 per ems.  Reports pt has pacemaker but can't see spikes on ems strip.   EMS administered 4mg  of zofran prior to arrival.  Reports pt c/o arm itching for a few min after the zofran administration.

## 2015-03-05 NOTE — ED Notes (Signed)
Pt complains of generalized chest pain since this morning rated as 8/10 and described as aching.  Denies radiation to other areas.  Pt denies nausea, vomiting, shortness of breath, and diaphoresis.  Pt endorses history of dizziness on occasion but reports that those symptoms have improved recently.  Pt has history of hypertension and hyperlipidemia, family cardiac history is unknown other than a son with atrial fibrillation.  Pt has a pacemaker inserted at least ten years ago and per family report had a new battery installed approximately one year ago.  Pt daughter is unsure of reason for pacemaker insertion.  Per family report, patient has a history of dysphagia and just this morning had difficulty swallowing her breakfast.  She normally adheres to a mechanically soft diet due to poor dentition. Her family reports that she tolerates thin liquids with no difficulty.

## 2015-03-05 NOTE — ED Provider Notes (Signed)
CSN: [id number]     Arrival date & time 03/05/15  1250 History   First MD Initiated Contact with Patient 03/05/15 1304     Chief Complaint  Patient presents with  . Chest Pain     (Consider location/radiation/quality/duration/timing/severity/associated sxs/prior Treatment) HPI   [name] is a 81 y.o. female presents for an episode of chest pain. Chest pain occurred immediately after she ate a sausage biscuit. Chest pain was transient. She did not receive anything for it. She has had similar episodes in the past. She lives alone. She is debilitated, but able to get around the house using her walker. She is a poor historian. He has a a bit comes in daily and her daughter comes in to help with meals and medications. Apparently she might be constipated. There was no associated vomiting, but her daughter feels like the patient was nauseated at the time. Unclear if she is taking her medicines today as directed. There's been no recent fever, cough, or change in bowel or urinary habits. There are no other modifying factors.   Past Medical History  Diagnosis Date  . Dementia   . Pacemaker   . Hypertension   . Acid reflux   . Atrial fibrillation   . Symptomatic bradycardia   . Hyperlipidemia   . Atrophic vaginitis   . Chronic cystitis   . Decubitus ulcer of buttock   . Incomplete bladder emptying   . Microscopic hematuria   . Straining on urination   . Urge incontinence of urine   . UTI (lower urinary tract infection)    Past Surgical History  Procedure Laterality Date  . Pacemaker insertion    . Pacemaker generator change N/A 07/05/2013    Procedure: PACEMAKER GENERATOR CHANGE;  Surgeon: Thurmon Fair, MD;  Location: [facility];  Service: Cardiovascular;  Laterality: N/A;   Family History  Problem Relation Age of Onset  . Cancer Other    History  Substance Use Topics  . Smoking status: Never Smoker   . Smokeless tobacco: Never Used  . Alcohol Use: No   OB History    No  data available     Review of Systems  All other systems reviewed and are negative.     Allergies  Contrast media; Penicillins; and Sulfa antibiotics  Home Medications   Prior to Admission medications   Medication Sig Start Date End Date Taking? Authorizing Provider  cephALEXin (KEFLEX) 500 MG capsule Take 1 capsule (500 mg total) by mouth 4 (four) times daily. Patient taking differently: Take 500 mg by mouth at bedtime.  11/18/13  Yes Gilda Crease, MD  cholecalciferol (VITAMIN D) 1000 UNITS tablet Take 1,000 Units by mouth daily.   Yes Historical Provider, MD  docusate sodium (COLACE) 100 MG capsule Take 200 mg by mouth at bedtime.   Yes Historical Provider, MD  furosemide (LASIX) 40 MG tablet Take 40 mg by mouth daily.  09/25/14  Yes Historical Provider, MD  HYDROcodone-acetaminophen (NORCO/VICODIN) 5-325 MG per tablet Take 1 tablet by mouth every 6 (six) hours as needed. 10/12/14  Yes [name], MD  metoprolol succinate (TOPROL XL) 25 MG 24 hr tablet Take 1 tablet (25 mg total) by mouth daily. 10/28/14  Yes [name], MD  Multiple Vitamins-Minerals (CENTRUM SILVER PO) Take 1 tablet by mouth daily.     Yes Historical Provider, MD  omeprazole (PRILOSEC) 20 MG capsule Take 20 mg by mouth daily.     Yes Historical Provider, MD  potassium  chloride SA (K-DUR,KLOR-CON) 20 MEQ tablet Take 1 tablet by mouth daily. 09/24/14  Yes Historical Provider, MD  simvastatin (ZOCOR) 20 MG tablet Take 10 mg by mouth every evening.   Yes Historical Provider, MD  tetrahydrozoline (EYE DROPS) 0.05 % ophthalmic solution Place 1 drop into both eyes daily as needed. For dry eye relief   Yes Historical Provider, MD  Travoprost, BAK Free, (TRAVATAMN) 0.004 % SOLN ophthalmic solution Place 1 drop into both eyes at bedtime.    Yes Historical Provider, MD  traZODone (DESYREL) 50 MG tablet Take 50 mg by mouth at bedtime as needed for sleep.    Yes Historical Provider, MD  warfarin (COUMADIN) 3 MG  tablet Take 3 mg by mouth every evening.    Yes Historical Provider, MD  donepezil (ARICEPT) 10 MG tablet Take 1 tablet (10 mg total) by mouth at bedtime. Patient not taking: Reported on 03/05/2015 10/28/14   Rachelle HoraMihai Croitoru, MD  warfarin (COUMADIN) 3 MG tablet TAKE 1 TO 1 AND 1/2 TABLET BY MOUTH DAILY AS DIRECTED. Patient not taking: Reported on 03/05/2015 09/23/14   Rachelle HoraMihai Croitoru, MD  warfarin (COUMADIN) 3 MG tablet TAKE 1 TO 1 AND 1/2 TABLET BY MOUTH DAILY AS DIRECTED. Patient not taking: Reported on 03/05/2015 01/22/15   [name], MD   BP 132/57 mmHg  Pulse 73  Temp(Src) 97.8 F (36.6 C) (Oral)  Resp 22  SpO2 98% Physical Exam  Constitutional: She appears well-developed.  Elderly, frail  HENT:  Head: Normocephalic and atraumatic.  Right Ear: External ear normal.  Left Ear: External ear normal.  Eyes: Conjunctivae and EOM are normal. Pupils are equal, round, and reactive to light.  Neck: Normal range of motion and phonation normal. Neck supple.  Cardiovascular: Normal rate, regular rhythm and normal heart sounds.   Pulmonary/Chest: Effort normal and breath sounds normal. No respiratory distress. She has no wheezes. She exhibits no bony tenderness.  Abdominal: Soft. There is no tenderness.  Musculoskeletal: Normal range of motion.  Neurological: She is alert. No cranial nerve deficit or sensory deficit. She exhibits normal muscle tone. Coordination normal.  Oriented to person and place  Skin: Skin is warm, dry and intact.  Psychiatric: She has a normal mood and affect. Her behavior is normal.  Nursing note and vitals reviewed.   ED Course  Procedures (including critical care time) Medications - No data to display  Patient Vitals for the past 24 hrs:  BP Temp Temp src Pulse Resp SpO2  03/05/15 1831 132/57 mmHg 97.8 F (36.6 C) Oral 73 22 98 %  03/05/15 1747 138/65 mmHg - - 64 20 100 %  03/05/15 1448 (!) 127/50 mmHg - - 65 18 96 %   Pacemaker interrogation, by [employer]-  indicates a single right ventricular pacing wire and intermittent pacing, functional pacemaker status, and periods of tachycardia- tachycardia is very brief, about 3 seconds, and in the range of 250 bpm. She had one episode of 34 seconds of tachycardia in January 2016. Of note, the patient has no episodes of syncope associated with this pacemaker finding.  5:54 PM Reevaluation with update and discussion. After initial assessment and treatment, an updated evaluation reveals no change in clinical status. Findings discussed with patient and family members. Patient is currently using Colace, twice a day, without relief of her constipation. All questions answered. Tyia Binford L    Labs Review Labs Reviewed  BASIC METABOLIC PANEL - Abnormal; Notable for the following:    Sodium 147 (*)  Glucose, Bld 117 (*)    BUN 27 (*)    GFR calc non Af Amer 47 (*)    GFR calc Af Amer 55 (*)    All other components within normal limits  PROTIME-INR - Abnormal; Notable for the following:    Prothrombin Time 26.6 (*)    INR 2.43 (*)    All other components within normal limits  CBC WITH DIFFERENTIAL/PLATELET  TROPONIN I    Imaging Review Dg Chest Portable 1 View  03/05/2015   CLINICAL DATA:  Chest pain and nausea for 1 hour.  EXAM: PORTABLE CHEST - 1 VIEW  COMPARISON:  Single view of the chest 11/18/2013 and 08/29/2012.  FINDINGS: Mild left basilar atelectasis is noted. The right lung is clear. There is cardiomegaly but no edema. Pacing is device in place.  IMPRESSION: Cardiomegaly without acute disease.   Electronically Signed   By: Drusilla Kanner M.D.   On: 03/05/2015 13:46     EKG Interpretation   Date/Time:  Thursday March 05 2015 12:57:24 EDT Ventricular Rate:  97 PR Interval:  64 QRS Duration: 123 QT Interval:  379 QTC Calculation: 481 R Axis:   -95 Text Interpretation:  Atrial fibrillation Right bundle branch block  Nonspecific T abnormalities, lateral leads Since last tracing of earlier   today no V-pacing, and now in Atrial Fibrillation with RBBB Confirmed by  [facility]  MD, Yoseph Haile [phone]) on 03/05/2015 3:50:18 PM       EKG Interpretation  Date/Time:  Thursday March 05 2015 12:57:24 EDT Ventricular Rate:  97 PR Interval:  64 QRS Duration: 123 QT Interval:  379 QTC Calculation: 481 R Axis:   -95 Text Interpretation:  Atrial fibrillation Right bundle branch block Nonspecific T abnormalities, lateral leads Since last tracing of earlier today no V-pacing, and now in Atrial Fibrillation with RBBB Confirmed by Armanii Urbanik  MD, Mechele Collin ([id number]) on 03/05/2015 3:50:18 PM         MDM   Final diagnoses:  Nonspecific chest pain  Constipation, unspecified constipation type    Nonspecific chest pain, most likely to represent gastritis versus esophagitis. Doubt ACS, PE or pneumonia. Incidental pacemaker evaluation, ordered by nursing, reveals only occasional very brief episodes of tachycardia. Patient does not have any syncope. There is no indication for further monitoring and treatment of this problem, from the emergency department or hospitalization standpoint. She is stable for discharge to follow-up with her cardiologist, regarding the pacemaker abnormality.   Nursing Notes Reviewed/ Care Coordinated Applicable Imaging Reviewed Interpretation of Laboratory Data incorporated into ED treatment  The patient appears reasonably screened and/or stabilized for discharge and I doubt any other medical condition or other [facility] requiring further screening, evaluation, or treatment in the ED at this time prior to discharge.  Plan: Home Medications- usual, plus MiraLAX; Home Treatments- rest; return here if the recommended treatment, does not improve the symptoms; Recommended follow up- recommended follow up with cardiology regarding pacemaker abnormality, by phone tomorrow. See PCP regarding gastritis and constipation.    Mancel Bale, MD 03/05/15 2214

## 2015-03-06 NOTE — Telephone Encounter (Signed)
Outgoing call to pt. Advised pacemaker looked OK at last check & couldn't find any concerning notes on ER visit yesterday. She voiced understanding.  Addressed questions from daughter concerning OTC meds & nutrition. Advised to contact PCP if further questions regarding meal replacement, specific nutrition guidelines, etc. She voiced understanding.

## 2015-03-06 NOTE — Telephone Encounter (Signed)
Pt's daughter called in stating that the [facility] was [facility] and a report was taken stating that the pace maker was abnormal. Please f/u with pt   Thanks

## 2015-03-09 DIAGNOSIS — N3020 Other chronic cystitis without hematuria: Secondary | ICD-10-CM | POA: Diagnosis not present

## 2015-03-09 DIAGNOSIS — N3941 Urge incontinence: Secondary | ICD-10-CM | POA: Diagnosis not present

## 2015-03-09 DIAGNOSIS — R312 Other microscopic hematuria: Secondary | ICD-10-CM | POA: Diagnosis not present

## 2015-03-17 DIAGNOSIS — H4011X1 Primary open-angle glaucoma, mild stage: Secondary | ICD-10-CM | POA: Diagnosis not present

## 2015-03-25 ENCOUNTER — Other Ambulatory Visit: Payer: Self-pay | Admitting: Cardiovascular Disease

## 2015-03-25 ENCOUNTER — Ambulatory Visit (INDEPENDENT_AMBULATORY_CARE_PROVIDER_SITE_OTHER): Payer: Commercial Managed Care - HMO | Admitting: Pharmacist Clinician (PhC)/ Clinical Pharmacy Specialist

## 2015-03-25 DIAGNOSIS — Z7901 Long term (current) use of anticoagulants: Secondary | ICD-10-CM

## 2015-03-25 DIAGNOSIS — I4891 Unspecified atrial fibrillation: Secondary | ICD-10-CM

## 2015-03-25 LAB — PT WITH INR/FINGERSTICK
INR FINGERSTICK: 2.9 — AB (ref 0.80–1.20)
PT FINGERSTICK: 34.9 s — AB (ref 10.4–12.5)

## 2015-03-30 DIAGNOSIS — I480 Paroxysmal atrial fibrillation: Secondary | ICD-10-CM | POA: Diagnosis not present

## 2015-03-30 DIAGNOSIS — J309 Allergic rhinitis, unspecified: Secondary | ICD-10-CM | POA: Diagnosis not present

## 2015-03-30 DIAGNOSIS — I5022 Chronic systolic (congestive) heart failure: Secondary | ICD-10-CM | POA: Diagnosis not present

## 2015-04-23 ENCOUNTER — Other Ambulatory Visit: Payer: Self-pay | Admitting: Cardiovascular Disease

## 2015-04-23 DIAGNOSIS — F0390 Unspecified dementia without behavioral disturbance: Secondary | ICD-10-CM | POA: Diagnosis not present

## 2015-04-23 DIAGNOSIS — I5022 Chronic systolic (congestive) heart failure: Secondary | ICD-10-CM | POA: Diagnosis not present

## 2015-04-23 DIAGNOSIS — I480 Paroxysmal atrial fibrillation: Secondary | ICD-10-CM | POA: Diagnosis not present

## 2015-04-23 DIAGNOSIS — I4891 Unspecified atrial fibrillation: Secondary | ICD-10-CM | POA: Diagnosis not present

## 2015-04-23 DIAGNOSIS — J069 Acute upper respiratory infection, unspecified: Secondary | ICD-10-CM | POA: Diagnosis not present

## 2015-04-23 DIAGNOSIS — Z7901 Long term (current) use of anticoagulants: Secondary | ICD-10-CM | POA: Diagnosis not present

## 2015-04-23 LAB — PROTIME-INR: INR: 3.2 — AB (ref <=1.1)

## 2015-04-23 LAB — PT WITH INR/FINGERSTICK
INR, fingerstick: 3.2 — ABNORMAL HIGH (ref 0.80–1.20)
PT FINGERSTICK: 38.2 s — AB (ref 10.4–12.5)

## 2015-04-24 ENCOUNTER — Ambulatory Visit (INDEPENDENT_AMBULATORY_CARE_PROVIDER_SITE_OTHER): Payer: Commercial Managed Care - HMO | Admitting: Pharmacist Clinician (PhC)/ Clinical Pharmacy Specialist

## 2015-04-24 DIAGNOSIS — I4891 Unspecified atrial fibrillation: Secondary | ICD-10-CM

## 2015-04-24 DIAGNOSIS — Z7901 Long term (current) use of anticoagulants: Secondary | ICD-10-CM

## 2015-04-28 ENCOUNTER — Other Ambulatory Visit: Payer: Self-pay | Admitting: Cardiovascular Disease

## 2015-04-30 DIAGNOSIS — M810 Age-related osteoporosis without current pathological fracture: Secondary | ICD-10-CM | POA: Diagnosis not present

## 2015-04-30 DIAGNOSIS — I5022 Chronic systolic (congestive) heart failure: Secondary | ICD-10-CM | POA: Diagnosis not present

## 2015-04-30 DIAGNOSIS — I480 Paroxysmal atrial fibrillation: Secondary | ICD-10-CM | POA: Diagnosis not present

## 2015-04-30 DIAGNOSIS — F0390 Unspecified dementia without behavioral disturbance: Secondary | ICD-10-CM | POA: Diagnosis not present

## 2015-04-30 DIAGNOSIS — I10 Essential (primary) hypertension: Secondary | ICD-10-CM | POA: Diagnosis not present

## 2015-05-04 ENCOUNTER — Encounter: Payer: Commercial Managed Care - HMO | Admitting: *Deleted

## 2015-05-05 ENCOUNTER — Ambulatory Visit (INDEPENDENT_AMBULATORY_CARE_PROVIDER_SITE_OTHER): Payer: Commercial Managed Care - HMO | Admitting: *Deleted

## 2015-05-05 DIAGNOSIS — I4891 Unspecified atrial fibrillation: Secondary | ICD-10-CM

## 2015-05-07 NOTE — Progress Notes (Signed)
Remote pacemaker transmission.   

## 2015-05-11 DIAGNOSIS — N390 Urinary tract infection, site not specified: Secondary | ICD-10-CM | POA: Diagnosis not present

## 2015-05-11 DIAGNOSIS — R300 Dysuria: Secondary | ICD-10-CM | POA: Diagnosis not present

## 2015-05-12 ENCOUNTER — Telehealth: Payer: Self-pay | Admitting: Cardiovascular Disease

## 2015-05-12 ENCOUNTER — Other Ambulatory Visit: Payer: Self-pay | Admitting: Cardiovascular Disease

## 2015-05-12 ENCOUNTER — Other Ambulatory Visit (HOSPITAL_COMMUNITY): Payer: Self-pay | Admitting: Internal Medicine

## 2015-05-12 ENCOUNTER — Ambulatory Visit (HOSPITAL_COMMUNITY)
Admission: RE | Admit: 2015-05-12 | Discharge: 2015-05-12 | Disposition: A | Discharge status: Home / Self Care | Payer: Commercial Managed Care - HMO | Source: Ambulatory Visit | Attending: Internal Medicine | Admitting: Internal Medicine

## 2015-05-12 DIAGNOSIS — I4891 Unspecified atrial fibrillation: Secondary | ICD-10-CM | POA: Diagnosis not present

## 2015-05-12 DIAGNOSIS — R0602 Shortness of breath: Secondary | ICD-10-CM | POA: Insufficient documentation

## 2015-05-12 DIAGNOSIS — Z7901 Long term (current) use of anticoagulants: Secondary | ICD-10-CM | POA: Diagnosis not present

## 2015-05-12 DIAGNOSIS — I5022 Chronic systolic (congestive) heart failure: Secondary | ICD-10-CM | POA: Diagnosis not present

## 2015-05-12 DIAGNOSIS — I480 Paroxysmal atrial fibrillation: Secondary | ICD-10-CM | POA: Diagnosis not present

## 2015-05-12 DIAGNOSIS — I10 Essential (primary) hypertension: Secondary | ICD-10-CM | POA: Diagnosis not present

## 2015-05-12 LAB — PT WITH INR/FINGERSTICK
INR FINGERSTICK: 2.7 — AB (ref 0.80–1.20)
PT, fingerstick: 32.4 seconds — ABNORMAL HIGH (ref 10.4–12.5)

## 2015-05-12 NOTE — Telephone Encounter (Signed)
Call routed to [facility] D for resolution on coumadin scheduling

## 2015-05-13 ENCOUNTER — Ambulatory Visit (INDEPENDENT_AMBULATORY_CARE_PROVIDER_SITE_OTHER): Payer: Commercial Managed Care - HMO | Admitting: Pharmacist Clinician (PhC)/ Clinical Pharmacy Specialist

## 2015-05-13 DIAGNOSIS — I4891 Unspecified atrial fibrillation: Secondary | ICD-10-CM

## 2015-05-13 DIAGNOSIS — Z7901 Long term (current) use of anticoagulants: Secondary | ICD-10-CM

## 2015-05-14 LAB — CUP PACEART REMOTE DEVICE CHECK
Battery Voltage: 2.8 V
Brady Statistic RV Percent Paced: 21 %
Date Time Interrogation Session: 20160607163814
Lead Channel Impedance Value: 67 Ohm
Lead Channel Impedance Value: 774 Ohm
Lead Channel Pacing Threshold Amplitude: 0.625 V
Lead Channel Pacing Threshold Pulse Width: 0.4 ms
Lead Channel Sensing Intrinsic Amplitude: 11.2 mV
Lead Channel Setting Pacing Amplitude: 2 V
Lead Channel Setting Pacing Pulse Width: 0.4 ms
Lead Channel Setting Sensing Sensitivity: 4 mV
MDC IDC MSMT BATTERY IMPEDANCE: 101 Ohm
MDC IDC MSMT BATTERY REMAINING LONGEVITY: 161 mo

## 2015-05-21 ENCOUNTER — Encounter: Payer: Self-pay | Admitting: Cardiology

## 2015-05-27 ENCOUNTER — Encounter: Payer: Self-pay | Admitting: Cardiovascular Disease

## 2015-05-28 ENCOUNTER — Other Ambulatory Visit: Payer: Self-pay | Admitting: *Deleted

## 2015-05-28 ENCOUNTER — Other Ambulatory Visit: Payer: Self-pay | Admitting: Cardiovascular Disease

## 2015-06-04 ENCOUNTER — Other Ambulatory Visit: Payer: Self-pay | Admitting: Cardiovascular Disease

## 2015-06-04 ENCOUNTER — Ambulatory Visit (INDEPENDENT_AMBULATORY_CARE_PROVIDER_SITE_OTHER): Payer: Commercial Managed Care - HMO | Admitting: Pharmacist Clinician (PhC)/ Clinical Pharmacy Specialist

## 2015-06-04 DIAGNOSIS — I4891 Unspecified atrial fibrillation: Secondary | ICD-10-CM

## 2015-06-04 DIAGNOSIS — Z7901 Long term (current) use of anticoagulants: Secondary | ICD-10-CM | POA: Diagnosis not present

## 2015-06-04 LAB — PT WITH INR/FINGERSTICK
INR FINGERSTICK: 2.8 — AB (ref 0.80–1.20)
PT FINGERSTICK: 33.8 s — AB (ref 10.4–12.5)

## 2015-06-10 ENCOUNTER — Encounter: Payer: Self-pay | Admitting: Cardiovascular Disease

## 2015-06-16 DIAGNOSIS — N390 Urinary tract infection, site not specified: Secondary | ICD-10-CM | POA: Diagnosis not present

## 2015-06-16 DIAGNOSIS — I5022 Chronic systolic (congestive) heart failure: Secondary | ICD-10-CM | POA: Diagnosis not present

## 2015-06-16 DIAGNOSIS — H4011X1 Primary open-angle glaucoma, mild stage: Secondary | ICD-10-CM | POA: Diagnosis not present

## 2015-06-16 DIAGNOSIS — R0602 Shortness of breath: Secondary | ICD-10-CM | POA: Diagnosis not present

## 2015-06-16 DIAGNOSIS — M810 Age-related osteoporosis without current pathological fracture: Secondary | ICD-10-CM | POA: Diagnosis not present

## 2015-06-16 DIAGNOSIS — I10 Essential (primary) hypertension: Secondary | ICD-10-CM | POA: Diagnosis not present

## 2015-06-16 DIAGNOSIS — E039 Hypothyroidism, unspecified: Secondary | ICD-10-CM | POA: Diagnosis not present

## 2015-06-16 DIAGNOSIS — I480 Paroxysmal atrial fibrillation: Secondary | ICD-10-CM | POA: Diagnosis not present

## 2015-06-16 DIAGNOSIS — R300 Dysuria: Secondary | ICD-10-CM | POA: Diagnosis not present

## 2015-06-16 DIAGNOSIS — F0390 Unspecified dementia without behavioral disturbance: Secondary | ICD-10-CM | POA: Diagnosis not present

## 2015-06-26 ENCOUNTER — Telehealth: Payer: Self-pay | Admitting: *Deleted

## 2015-06-26 MED ORDER — METOPROLOL SUCCINATE ER 50 MG PO TB24
50.0000 mg | ORAL_TABLET | Freq: Every day | ORAL | Status: DC
Start: 2015-06-26 — End: 2015-10-12

## 2015-06-26 NOTE — Telephone Encounter (Signed)
Metoprolol succ increased to 50 mg daily per [facility] due to increased HVR on last remote. Daughter notified. Rx sent to pharmacy.

## 2015-06-30 ENCOUNTER — Other Ambulatory Visit: Payer: Self-pay | Admitting: Cardiovascular Disease

## 2015-06-30 ENCOUNTER — Ambulatory Visit (INDEPENDENT_AMBULATORY_CARE_PROVIDER_SITE_OTHER): Payer: Commercial Managed Care - HMO | Admitting: Pharmacist Clinician (PhC)/ Clinical Pharmacy Specialist

## 2015-06-30 DIAGNOSIS — Z7901 Long term (current) use of anticoagulants: Secondary | ICD-10-CM | POA: Diagnosis not present

## 2015-06-30 DIAGNOSIS — I4891 Unspecified atrial fibrillation: Secondary | ICD-10-CM

## 2015-06-30 LAB — PT WITH INR/FINGERSTICK
INR, fingerstick: 2.5 — ABNORMAL HIGH (ref 0.80–1.20)
PT, fingerstick: 30.5 seconds — ABNORMAL HIGH (ref 10.4–12.5)

## 2015-07-03 ENCOUNTER — Telehealth: Payer: Self-pay | Admitting: Pharmacist Clinician (PhC)/ Clinical Pharmacy Specialist

## 2015-07-03 NOTE — Telephone Encounter (Signed)
-----   Message from Thurmon Fair, MD sent at 06/30/2015  3:02 PM EDT ----- Thanks. I don't think missing just 5-6 doses could have caused the fast heart rates that occurred 25% of the time over last 3 months. Would go ahead with dose increase. MCr ----- Message -----    From: Rosalee Kaufman, RPH-CPP    Sent: 06/30/2015   2:14 PM      To: Thurmon Fair, MD, Sebastian Ache, CMA  Dr. Salena Saner (welcome back)  Daughter wanted to know if the fact that her mother missed 4-5 days of metoprolol about 5-6 weeks ago is the cause of increasing her dose?  She wasn't sure as to why the dose was increased.  Belenda Cruise

## 2015-07-03 NOTE — Telephone Encounter (Signed)
Passed message from Dr. Salena Saner to dtr voice mail

## 2015-07-06 ENCOUNTER — Telehealth: Payer: Self-pay | Admitting: *Deleted

## 2015-07-06 NOTE — Telephone Encounter (Signed)
Informed daughter that the increase in Toprol was due to pt's HR being >100 bpm ~28% of the time. Daughter voiced understanding and appreciation of call.

## 2015-07-21 ENCOUNTER — Telehealth: Payer: Self-pay | Admitting: Cardiovascular Disease

## 2015-07-21 NOTE — Telephone Encounter (Signed)
Pt's daughter called in stating that she needs some orders to have the pt's coumadin checked at the [facility] in [place]. Please call  Thanks

## 2015-07-21 NOTE — Telephone Encounter (Signed)
Spoke with pt dtr, she is going out of town and also having some surgery and wanted to have the patients INR checked in [place] Friday of this week instead of waiting until next week. Okay given for pt to go ahead and have the INR checked, will make the pharm md aware.

## 2015-07-24 ENCOUNTER — Other Ambulatory Visit: Payer: Self-pay | Admitting: Cardiovascular Disease

## 2015-07-24 ENCOUNTER — Ambulatory Visit (INDEPENDENT_AMBULATORY_CARE_PROVIDER_SITE_OTHER): Payer: Commercial Managed Care - HMO | Admitting: Pharmacist Clinician (PhC)/ Clinical Pharmacy Specialist

## 2015-07-24 DIAGNOSIS — Z7901 Long term (current) use of anticoagulants: Secondary | ICD-10-CM

## 2015-07-24 DIAGNOSIS — I4891 Unspecified atrial fibrillation: Secondary | ICD-10-CM

## 2015-07-24 LAB — PT WITH INR/FINGERSTICK
INR FINGERSTICK: 2.2 — AB (ref 0.80–1.20)
PT FINGERSTICK: 26.6 s — AB (ref 10.4–12.5)

## 2015-07-24 LAB — POCT INR: INR: 2.2

## 2015-08-06 ENCOUNTER — Encounter: Payer: Self-pay | Admitting: Cardiovascular Disease

## 2015-08-06 ENCOUNTER — Ambulatory Visit (INDEPENDENT_AMBULATORY_CARE_PROVIDER_SITE_OTHER): Payer: Commercial Managed Care - HMO | Admitting: *Deleted

## 2015-08-06 DIAGNOSIS — I4891 Unspecified atrial fibrillation: Secondary | ICD-10-CM | POA: Diagnosis not present

## 2015-08-06 NOTE — Progress Notes (Signed)
Remote pacemaker transmission.   

## 2015-08-18 ENCOUNTER — Other Ambulatory Visit: Payer: Self-pay | Admitting: Cardiovascular Disease

## 2015-08-18 DIAGNOSIS — Z7901 Long term (current) use of anticoagulants: Secondary | ICD-10-CM | POA: Diagnosis not present

## 2015-08-18 DIAGNOSIS — I4891 Unspecified atrial fibrillation: Secondary | ICD-10-CM | POA: Diagnosis not present

## 2015-08-18 LAB — PT WITH INR/FINGERSTICK
INR FINGERSTICK: 3.2 — AB (ref 0.80–1.20)
PT, fingerstick: 37.9 seconds — ABNORMAL HIGH (ref 10.4–12.5)

## 2015-08-19 ENCOUNTER — Ambulatory Visit (INDEPENDENT_AMBULATORY_CARE_PROVIDER_SITE_OTHER): Payer: Commercial Managed Care - HMO | Admitting: Pharmacist Clinician (PhC)/ Clinical Pharmacy Specialist

## 2015-08-19 DIAGNOSIS — I4891 Unspecified atrial fibrillation: Secondary | ICD-10-CM

## 2015-08-19 DIAGNOSIS — Z7901 Long term (current) use of anticoagulants: Secondary | ICD-10-CM

## 2015-08-19 LAB — CUP PACEART REMOTE DEVICE CHECK
Battery Impedance: 149 Ohm
Battery Remaining Longevity: 146 mo
Lead Channel Impedance Value: 67 Ohm
Lead Channel Impedance Value: 752 Ohm
Lead Channel Pacing Threshold Pulse Width: 0.4 ms
Lead Channel Sensing Intrinsic Amplitude: 11.2 mV
Lead Channel Setting Sensing Sensitivity: 5.6 mV
MDC IDC MSMT BATTERY VOLTAGE: 2.8 V
MDC IDC MSMT LEADCHNL RV PACING THRESHOLD AMPLITUDE: 0.625 V
MDC IDC SESS DTM: 20160908155809
MDC IDC SET LEADCHNL RV PACING AMPLITUDE: 2 V
MDC IDC SET LEADCHNL RV PACING PULSEWIDTH: 0.4 ms
MDC IDC STAT BRADY RV PERCENT PACED: 22 %

## 2015-09-01 ENCOUNTER — Ambulatory Visit (INDEPENDENT_AMBULATORY_CARE_PROVIDER_SITE_OTHER): Payer: Commercial Managed Care - HMO | Admitting: Pharmacist Clinician (PhC)/ Clinical Pharmacy Specialist

## 2015-09-01 ENCOUNTER — Other Ambulatory Visit: Payer: Self-pay | Admitting: Cardiovascular Disease

## 2015-09-01 DIAGNOSIS — Z7901 Long term (current) use of anticoagulants: Secondary | ICD-10-CM | POA: Diagnosis not present

## 2015-09-01 DIAGNOSIS — I4891 Unspecified atrial fibrillation: Secondary | ICD-10-CM | POA: Diagnosis not present

## 2015-09-01 LAB — PROTIME-INR: INR: 2.9 — AB (ref <=1.1)

## 2015-09-01 LAB — PT WITH INR/FINGERSTICK
INR FINGERSTICK: 2.9 — AB (ref 0.80–1.20)
PT, fingerstick: 34.9 seconds — ABNORMAL HIGH (ref 10.4–12.5)

## 2015-09-02 ENCOUNTER — Encounter: Payer: Self-pay | Admitting: Cardiology

## 2015-09-15 DIAGNOSIS — I480 Paroxysmal atrial fibrillation: Secondary | ICD-10-CM | POA: Diagnosis not present

## 2015-09-15 DIAGNOSIS — H401131 Primary open-angle glaucoma, bilateral, mild stage: Secondary | ICD-10-CM | POA: Diagnosis not present

## 2015-09-15 DIAGNOSIS — I5022 Chronic systolic (congestive) heart failure: Secondary | ICD-10-CM | POA: Diagnosis not present

## 2015-09-15 DIAGNOSIS — M810 Age-related osteoporosis without current pathological fracture: Secondary | ICD-10-CM | POA: Diagnosis not present

## 2015-09-15 DIAGNOSIS — F0390 Unspecified dementia without behavioral disturbance: Secondary | ICD-10-CM | POA: Diagnosis not present

## 2015-09-18 ENCOUNTER — Ambulatory Visit (INDEPENDENT_AMBULATORY_CARE_PROVIDER_SITE_OTHER): Payer: Commercial Managed Care - HMO | Admitting: Urology

## 2015-09-18 DIAGNOSIS — N390 Urinary tract infection, site not specified: Secondary | ICD-10-CM | POA: Diagnosis not present

## 2015-09-18 DIAGNOSIS — N3941 Urge incontinence: Secondary | ICD-10-CM | POA: Diagnosis not present

## 2015-09-18 DIAGNOSIS — N3020 Other chronic cystitis without hematuria: Secondary | ICD-10-CM

## 2015-09-24 ENCOUNTER — Other Ambulatory Visit: Payer: Self-pay | Admitting: Cardiovascular Disease

## 2015-09-24 DIAGNOSIS — Z7901 Long term (current) use of anticoagulants: Secondary | ICD-10-CM | POA: Diagnosis not present

## 2015-09-24 DIAGNOSIS — I4891 Unspecified atrial fibrillation: Secondary | ICD-10-CM | POA: Diagnosis not present

## 2015-09-24 LAB — PT WITH INR/FINGERSTICK
INR, fingerstick: 3.7 — ABNORMAL HIGH (ref 0.80–1.20)
PT, fingerstick: 43.9 seconds — ABNORMAL HIGH (ref 10.4–12.5)

## 2015-09-25 ENCOUNTER — Ambulatory Visit (INDEPENDENT_AMBULATORY_CARE_PROVIDER_SITE_OTHER): Payer: Commercial Managed Care - HMO | Admitting: Pharmacist Clinician (PhC)/ Clinical Pharmacy Specialist

## 2015-09-25 DIAGNOSIS — I4891 Unspecified atrial fibrillation: Secondary | ICD-10-CM

## 2015-09-25 DIAGNOSIS — Z7901 Long term (current) use of anticoagulants: Secondary | ICD-10-CM

## 2015-10-01 ENCOUNTER — Telehealth: Payer: Self-pay | Admitting: Cardiovascular Disease

## 2015-10-01 NOTE — Telephone Encounter (Signed)
Spoke to daughter, clarified that patient has finished macrodantin, now back on cephalexin 250 mg prophylactic.  She is due for next INR week of Nov 14.  Daughter voiced understanding.    As for the metoprolol, daughter gave Ms. [name] a dose a noon today (she missed last night).  Advised this is fine, tomorrow can go back to evening dosing.  Daughter voiced understanding.

## 2015-10-01 NOTE — Telephone Encounter (Signed)
Norma LundJanet states she just gave her mother her heart pill.  Does she give her another one tonight?

## 2015-10-01 NOTE — Telephone Encounter (Signed)
Returned call to Marylu LundJanet, advised to resume metoprolol XL 50mg  tomorrow evening.  She also had question about Aricept - thinks a physician gave instruction for her to cut dose, but did not give reason why. I do not see notation where we instructed on changing this dose - I do see Dr. Royann Shiversroitoru renewed this for her.  She thinks Dr. Felecia ShellingFanta made this adjustment - advised to contact them for clarification on med and that they may want to manage refills on this as well.  Last request from caller was to find out when pt is recommended to return for PT INR check.  Informed her I would route to Belenda CruiseKristin- pt has pacer check on 12/6 but given recent dose change r/t antibiotic use, may warrant sooner office visit for coumadin.

## 2015-10-05 ENCOUNTER — Emergency Department (HOSPITAL_COMMUNITY): Payer: Commercial Managed Care - HMO

## 2015-10-05 ENCOUNTER — Emergency Department (HOSPITAL_COMMUNITY)
Admission: EM | Admit: 2015-10-05 | Discharge: 2015-10-05 | Disposition: A | Discharge status: Home / Self Care | Payer: Commercial Managed Care - HMO | Attending: Emergency Medicine | Admitting: Emergency Medicine

## 2015-10-05 ENCOUNTER — Encounter (HOSPITAL_COMMUNITY): Payer: Self-pay | Admitting: Emergency Medicine

## 2015-10-05 DIAGNOSIS — I6789 Other cerebrovascular disease: Secondary | ICD-10-CM | POA: Diagnosis not present

## 2015-10-05 DIAGNOSIS — Z79899 Other long term (current) drug therapy: Secondary | ICD-10-CM | POA: Insufficient documentation

## 2015-10-05 DIAGNOSIS — Z880 Allergy status to penicillin: Secondary | ICD-10-CM | POA: Diagnosis not present

## 2015-10-05 DIAGNOSIS — I10 Essential (primary) hypertension: Secondary | ICD-10-CM | POA: Diagnosis not present

## 2015-10-05 DIAGNOSIS — I4891 Unspecified atrial fibrillation: Secondary | ICD-10-CM | POA: Insufficient documentation

## 2015-10-05 DIAGNOSIS — R410 Disorientation, unspecified: Secondary | ICD-10-CM | POA: Insufficient documentation

## 2015-10-05 DIAGNOSIS — E785 Hyperlipidemia, unspecified: Secondary | ICD-10-CM | POA: Diagnosis not present

## 2015-10-05 DIAGNOSIS — K219 Gastro-esophageal reflux disease without esophagitis: Secondary | ICD-10-CM | POA: Diagnosis not present

## 2015-10-05 DIAGNOSIS — F4489 Other dissociative and conversion disorders: Secondary | ICD-10-CM | POA: Diagnosis not present

## 2015-10-05 DIAGNOSIS — Z7901 Long term (current) use of anticoagulants: Secondary | ICD-10-CM | POA: Diagnosis not present

## 2015-10-05 DIAGNOSIS — F0390 Unspecified dementia without behavioral disturbance: Secondary | ICD-10-CM | POA: Diagnosis not present

## 2015-10-05 DIAGNOSIS — Z8742 Personal history of other diseases of the female genital tract: Secondary | ICD-10-CM | POA: Insufficient documentation

## 2015-10-05 DIAGNOSIS — Z792 Long term (current) use of antibiotics: Secondary | ICD-10-CM | POA: Insufficient documentation

## 2015-10-05 DIAGNOSIS — Z950 Presence of cardiac pacemaker: Secondary | ICD-10-CM | POA: Diagnosis not present

## 2015-10-05 DIAGNOSIS — Z87448 Personal history of other diseases of urinary system: Secondary | ICD-10-CM | POA: Diagnosis not present

## 2015-10-05 DIAGNOSIS — Z872 Personal history of diseases of the skin and subcutaneous tissue: Secondary | ICD-10-CM | POA: Diagnosis not present

## 2015-10-05 DIAGNOSIS — R4182 Altered mental status, unspecified: Secondary | ICD-10-CM | POA: Diagnosis not present

## 2015-10-05 DIAGNOSIS — Z87440 Personal history of urinary (tract) infections: Secondary | ICD-10-CM | POA: Insufficient documentation

## 2015-10-05 DIAGNOSIS — R079 Chest pain, unspecified: Secondary | ICD-10-CM | POA: Diagnosis not present

## 2015-10-05 LAB — URINALYSIS, ROUTINE W REFLEX MICROSCOPIC
BILIRUBIN URINE: NEGATIVE
GLUCOSE, UA: NEGATIVE mg/dL
Ketones, ur: NEGATIVE mg/dL
Leukocytes, UA: NEGATIVE
Nitrite: NEGATIVE
PROTEIN: NEGATIVE mg/dL
SPECIFIC GRAVITY, URINE: 1.015 (ref 1.005–1.030)
UROBILINOGEN UA: 0.2 mg/dL (ref 0.0–1.0)
pH: 7 (ref 5.0–8.0)

## 2015-10-05 LAB — COMPREHENSIVE METABOLIC PANEL
ALK PHOS: 67 U/L (ref 38–126)
ALT: 19 U/L (ref 14–54)
ANION GAP: 9 (ref 5–15)
AST: 29 U/L (ref 15–41)
Albumin: 3.3 g/dL — ABNORMAL LOW (ref 3.5–5.0)
BILIRUBIN TOTAL: 0.6 mg/dL (ref 0.3–1.2)
BUN: 34 mg/dL — ABNORMAL HIGH (ref 6–20)
CALCIUM: 8.9 mg/dL (ref 8.9–10.3)
CO2: 27 mmol/L (ref 22–32)
Chloride: 106 mmol/L (ref 101–111)
Creatinine, Ser: 0.94 mg/dL (ref 0.44–1.00)
GFR, EST NON AFRICAN AMERICAN: 53 mL/min — AB (ref >60)
GLUCOSE: 120 mg/dL — AB (ref 65–99)
POTASSIUM: 4.2 mmol/L (ref 3.5–5.1)
Sodium: 142 mmol/L (ref 135–145)
TOTAL PROTEIN: 6.5 g/dL (ref 6.5–8.1)

## 2015-10-05 LAB — PROTIME-INR
INR: 2.66 — ABNORMAL HIGH (ref 0.00–1.49)
PROTHROMBIN TIME: 28 s — AB (ref 11.6–15.2)

## 2015-10-05 LAB — CBC WITH DIFFERENTIAL/PLATELET
Basophils Absolute: 0 10*3/uL (ref 0.0–0.1)
Basophils Relative: 1 %
EOS ABS: 0.1 10*3/uL (ref 0.0–0.7)
EOS PCT: 2 %
HCT: 39.5 % (ref 36.0–46.0)
HEMOGLOBIN: 12.4 g/dL (ref 12.0–15.0)
Lymphocytes Relative: 25 %
Lymphs Abs: 1.4 10*3/uL (ref 0.7–4.0)
MCH: 29.8 pg (ref 26.0–34.0)
MCHC: 31.4 g/dL (ref 30.0–36.0)
MCV: 95 fL (ref 78.0–100.0)
MONO ABS: 0.5 10*3/uL (ref 0.1–1.0)
MONOS PCT: 9 %
NEUTROS PCT: 63 %
Neutro Abs: 3.6 10*3/uL (ref 1.7–7.7)
PLATELETS: 184 10*3/uL (ref 150–400)
RBC: 4.16 MIL/uL (ref 3.87–5.11)
RDW: 15.7 % — ABNORMAL HIGH (ref 11.5–15.5)
WBC: 5.7 10*3/uL (ref 4.0–10.5)

## 2015-10-05 LAB — TROPONIN I

## 2015-10-05 LAB — URINE MICROSCOPIC-ADD ON

## 2015-10-05 LAB — I-STAT CG4 LACTIC ACID, ED: LACTIC ACID, VENOUS: 1.3 mmol/L (ref 0.5–2.0)

## 2015-10-05 LAB — ETHANOL: Alcohol, Ethyl (B): <5 mg/dL (ref <5)

## 2015-10-05 LAB — CBG MONITORING, ED: Glucose-Capillary: 100 mg/dL — ABNORMAL HIGH (ref 65–99)

## 2015-10-05 MED ORDER — SODIUM CHLORIDE 0.9 % IV SOLN
1000.0000 mL | INTRAVENOUS | Status: DC
  Administered 2015-10-05: 1000 mL via INTRAVENOUS

## 2015-10-05 NOTE — Clinical Social Work Note (Signed)
CSW called by EDP regarding request for information for placement from home. CSW met with pt's daughter and provided SNF and ALF lists. She states that pt lives alone and has an aid for 3 hours a day, but she feels that pt may require more assistance soon. Daughter just discussed this with pt today. They are most interested in [facility] as pt has friends there. CSW discussed applying for Medicaid and need for PCP to complete FL2 if accepted at facility. Daughter appreciative of information.  Benay Pike, [place]

## 2015-10-05 NOTE — ED Notes (Signed)
Social work at bedside.  

## 2015-10-05 NOTE — Discharge Instructions (Signed)

## 2015-10-05 NOTE — ED Notes (Signed)
Per EMS pt from home. Family called EMS for altered mental status x 2-3 days. States pt "has not been acting herself." Pt hx of multiple UTI's. Pt alert and cooperative at this time.

## 2015-10-05 NOTE — ED Provider Notes (Signed)
CSN: [id number]     Arrival date & time 10/05/15  1134 History  By signing my name below, I, [name], attest that this documentation has been prepared under the direction and in the presence of Norma Hong, MD. Electronically Signed: [name], ED Scribe. 10/05/2015. 12:07 PM.   Chief Complaint  Patient presents with  . Altered Mental Status    LEVEL 5 CAVEAT: MENTAL STATUS CHANGE  The history is provided by the EMS personnel. No language interpreter was used.    [name] is a 81 y.o. female with a medical hx of dementia, HTN, pacemaker, and chronic UTIs who presents to the Emergency Department via EMS complaining of AMS onset 2-3 days. Pt hx given per family to EMS  who states that the patient has not been acting like herself. There are no family members in the ED at this time to give further report. She notes that she has not tried any medications for the relief of her symptoms. She denies any other symptoms.   Past Medical History  Diagnosis Date  . Dementia   . Pacemaker   . Hypertension   . Acid reflux   . Atrial fibrillation (HCC)   . Symptomatic bradycardia   . Hyperlipidemia   . Atrophic vaginitis   . Chronic cystitis   . Decubitus ulcer of buttock   . Incomplete bladder emptying   . Microscopic hematuria   . Straining on urination   . Urge incontinence of urine   . UTI (lower urinary tract infection)    Past Surgical History  Procedure Laterality Date  . Pacemaker insertion    . Pacemaker generator change N/A 07/05/2013    Procedure: PACEMAKER GENERATOR CHANGE;  Surgeon: Thurmon Fair, MD;  Location: [facility] CATH [facility];  Service: Cardiovascular;  Laterality: N/A;  . Cardiac catheterization  04/2004   Family History  Problem Relation Age of Onset  . Cancer Other    Social History  Substance Use Topics  . Smoking status: Never Smoker   . Smokeless tobacco: Never Used  . Alcohol Use: No   OB History    No data available     Review of Systems  Unable  to perform ROS: Mental status change      Allergies  Contrast media; Penicillins; and Sulfa antibiotics  Home Medications   Prior to Admission medications   Medication Sig Start Date End Date Taking? Authorizing Provider  cephALEXin (KEFLEX) 500 MG capsule Take 500 mg by mouth daily.   Yes Historical Provider, MD  cholecalciferol (VITAMIN D) 1000 UNITS tablet Take 1,000 Units by mouth daily.   Yes Historical Provider, MD  furosemide (LASIX) 40 MG tablet Take 40 mg by mouth daily.  09/25/14  Yes Historical Provider, MD  HYDROcodone-acetaminophen (NORCO/VICODIN) 5-325 MG per tablet Take 1 tablet by mouth every 6 (six) hours as needed. 10/12/14  Yes [name], MD  metoprolol succinate (TOPROL-XL) 50 MG 24 hr tablet Take 1 tablet (50 mg total) by mouth daily. Take with or immediately following a meal. 06/26/15  Yes [name], MD  Multiple Vitamins-Minerals (CENTRUM SILVER PO) Take 1 tablet by mouth daily.     Yes Historical Provider, MD  omeprazole (PRILOSEC) 20 MG capsule Take 20 mg by mouth daily.     Yes Historical Provider, MD  potassium chloride SA (K-DUR,KLOR-CON) 20 MEQ tablet Take 1 tablet by mouth daily. 09/24/14  Yes Historical Provider, MD  simvastatin (ZOCOR) 10 MG tablet Take 10 mg by mouth daily.  Yes Historical Provider, MD  simvastatin (ZOCOR) 20 MG tablet Take 10 mg by mouth every evening.   Yes Historical Provider, MD  tetrahydrozoline (EYE DROPS) 0.05 % ophthalmic solution Place 1 drop into both eyes daily as needed. For dry eye relief   Yes Historical Provider, MD  Travoprost, BAK Free, (TRAVATAMN) 0.004 % SOLN ophthalmic solution Place 1 drop into both eyes at bedtime.    Yes Historical Provider, MD  traZODone (DESYREL) 50 MG tablet Take 50 mg by mouth at bedtime as needed for sleep.    Yes Historical Provider, MD  warfarin (COUMADIN) 3 MG tablet TAKE 1 TO 1 AND 1/2 TABLET BY MOUTH DAILY AS DIRECTED. 05/28/15  Yes [name], MD   BP 116/64 mmHg  Pulse 60   Temp(Src) 97.9 F (36.6 C) (Oral)  Resp 20  SpO2 97% Physical Exam  Constitutional: She is oriented to person, place, and time. She appears well-developed.  HENT:  Head: Normocephalic.  Mouth/Throat: Mucous membranes are dry.  Eyes: Conjunctivae and EOM are normal. No scleral icterus.  Left pupil abnormally shaped  Neck: Neck supple. No thyromegaly present.  Cardiovascular: Normal rate and regular rhythm.  Exam reveals no gallop and no friction rub.   No murmur heard. Pulmonary/Chest: No stridor. She has no wheezes. She has no rales. She exhibits no tenderness.  Abdominal: She exhibits no distension. There is no tenderness. There is no rebound.  Musculoskeletal: Normal range of motion. She exhibits no edema.  Lymphadenopathy:    She has no cervical adenopathy.  Neurological: She is oriented to person, place, and time. She exhibits normal muscle tone. Coordination normal.  Confused but clear speech. Follows commands.  Skin: No rash noted. No erythema.  Psychiatric: She has a normal mood and affect. Her behavior is normal.  Nursing note and vitals reviewed.   ED Course  Procedures (including critical care time) DIAGNOSTIC STUDIES: Oxygen Saturation is 95% on RA, adequate by my interpretation.    COORDINATION OF CARE: 12:07 PM Discussed treatment plan with pt at bedside and pt agreed to plan.    Labs Review Labs Reviewed  CBC WITH DIFFERENTIAL/PLATELET - Abnormal; Notable for the following:    RDW 15.7 (*)    All other components within normal limits  PROTIME-INR - Abnormal; Notable for the following:    Prothrombin Time 28.0 (*)    INR 2.66 (*)    All other components within normal limits  COMPREHENSIVE METABOLIC PANEL - Abnormal; Notable for the following:    Glucose, Bld 120 (*)    BUN 34 (*)    Albumin 3.3 (*)    GFR calc non Af Amer 53 (*)    All other components within normal limits  URINALYSIS, ROUTINE W REFLEX MICROSCOPIC (NOT AT [name]) - Abnormal; Notable for  the following:    Hgb urine dipstick SMALL (*)    All other components within normal limits  CBG MONITORING, ED - Abnormal; Notable for the following:    Glucose-Capillary 100 (*)    All other components within normal limits  CULTURE, BLOOD (ROUTINE X 2)  CULTURE, BLOOD (ROUTINE X 2)  URINE CULTURE  ETHANOL  TROPONIN I  URINE MICROSCOPIC-ADD ON  I-STAT CG4 LACTIC ACID, ED    Imaging Review Ct Head Wo Contrast  10/05/2015  CLINICAL DATA:  Altered mental status.  Not acting herself. EXAM: CT HEAD WITHOUT CONTRAST TECHNIQUE: Contiguous axial images were obtained from the base of the skull through the vertex without intravenous contrast. COMPARISON:  11/18/2013 FINDINGS: There is atrophy and chronic small vessel disease changes. No acute intracranial abnormality. Specifically, no hemorrhage, hydrocephalus, mass lesion, acute infarction, or significant intracranial injury. No acute calvarial abnormality. IMPRESSION: No acute intracranial abnormality. Atrophy, chronic microvascular disease. Electronically Signed   By: Charlett NoseKevin  Dover M.D.   On: 10/05/2015 13:31   Dg Chest Port 1 View  10/05/2015  CLINICAL DATA:  Chest pain today. EXAM: PORTABLE CHEST 1 VIEW COMPARISON:  PA and lateral chest 05/12/2015 and single view of the chest 03/05/2015. FINDINGS: Dual lead pacing device is unchanged in appearance. Mild subsegmental atelectasis is seen in the lung bases. Heart size is enlarged. No evidence of pulmonary edema is identified. No pneumothorax or pleural effusion. IMPRESSION: No acute disease. Electronically Signed   By: Drusilla Kannerhomas  Dalessio M.D.   On: 10/05/2015 12:57   I have personally reviewed and evaluated these images and lab results as part of my medical decision-making.   EKG Interpretation   Date/Time:  Monday October 05 2015 12:30:42 EST Ventricular Rate:  76 PR Interval:    QRS Duration: 169 QT Interval:  479 QTC Calculation: 539 R Axis:   -80 Text Interpretation:  Atrial fibrillation  with overlying paced rhythm  IVCD, consider atypical RBBB LVH with secondary repolarization abnormality  Lateral infarct, acute Probable anterior infarct, recent Baseline wander  in lead(s) V2 V3 Confirmed by Vauda Salvucci  MD, Laneah Luft ([id number]) on 10/05/2015  2:25:42 PM      MDM   Final diagnoses:  Confusion    The etiology of the patient's altered mental status is not clear. Her vital signs are unremarkable, she does not appear to be overly confused and follows commands without difficulty. We'll check labs, urinalysis, EKG is unremarkable.  Labs, EKG, CT scan all unremarkable, discussed with patient and family member, they will pursue placement in a nursing facility as outpatient. Social work consult that and give him resources.  I personally performed the services described in this documentation, which was scribed in my presence. The recorded information has been reviewed and is accurate.        Norma HongBrian Mishawn Didion, MD 10/05/15 1700

## 2015-10-07 LAB — URINE CULTURE
CULTURE: NO GROWTH
Special Requests: NORMAL

## 2015-10-10 LAB — CULTURE, BLOOD (ROUTINE X 2)
CULTURE: NO GROWTH
Culture: NO GROWTH

## 2015-10-12 ENCOUNTER — Telehealth: Payer: Self-pay | Admitting: Cardiovascular Disease

## 2015-10-12 MED ORDER — METOPROLOL SUCCINATE ER 25 MG PO TB24: 25.0000 mg | ORAL_TABLET | Freq: Every day | ORAL | Status: DC

## 2015-10-12 NOTE — Telephone Encounter (Signed)
Returned call to patient's daughter Marylu LundJanet.Stated since mother started taking metoprolol 50 mg daily she has been tired,wants to sleep a lot.Stated she was taken to [facility] 10/05/15 for confusion and tired.Patient has appointment with Dr.Croitoru 11/03/15.Stated she wanted to ask Dr.Croitoru if mother can decrease metoprolol.Message sent to Dr.Croitoru for advice.

## 2015-10-12 NOTE — Telephone Encounter (Signed)
Returned call to patient's daughter Marylu LundJanet.Dr.Croitoru advised ok to decrease metoprolol to 25 mg daily.Advised [facility] to [facility] device to check AFib rate control.

## 2015-10-12 NOTE — Telephone Encounter (Signed)
Pt Metoprolol was increased,now she is really,really tired. She went to [facility] by ambulance on [phone]. Please call to advise.

## 2015-10-12 NOTE — Telephone Encounter (Signed)
OK. Let's reduce metoprolol back to 25 mg daily, but please also ask them to do an ad hoc device downloa to see how we are doing with AFib rate control.

## 2015-10-19 ENCOUNTER — Ambulatory Visit (INDEPENDENT_AMBULATORY_CARE_PROVIDER_SITE_OTHER): Payer: Commercial Managed Care - HMO | Admitting: Pharmacist Clinician (PhC)/ Clinical Pharmacy Specialist

## 2015-10-19 ENCOUNTER — Other Ambulatory Visit: Payer: Self-pay | Admitting: Cardiovascular Disease

## 2015-10-19 DIAGNOSIS — Z7901 Long term (current) use of anticoagulants: Secondary | ICD-10-CM | POA: Diagnosis not present

## 2015-10-19 DIAGNOSIS — I4891 Unspecified atrial fibrillation: Secondary | ICD-10-CM | POA: Diagnosis not present

## 2015-10-19 LAB — PT WITH INR/FINGERSTICK
INR FINGERSTICK: 2.6 — AB (ref 0.80–1.20)
PT FINGERSTICK: 31 s — AB (ref 10.4–12.5)

## 2015-10-27 DIAGNOSIS — M810 Age-related osteoporosis without current pathological fracture: Secondary | ICD-10-CM | POA: Diagnosis not present

## 2015-10-27 DIAGNOSIS — F0390 Unspecified dementia without behavioral disturbance: Secondary | ICD-10-CM | POA: Diagnosis not present

## 2015-10-27 DIAGNOSIS — I10 Essential (primary) hypertension: Secondary | ICD-10-CM | POA: Diagnosis not present

## 2015-10-27 DIAGNOSIS — N390 Urinary tract infection, site not specified: Secondary | ICD-10-CM | POA: Diagnosis not present

## 2015-10-27 DIAGNOSIS — E039 Hypothyroidism, unspecified: Secondary | ICD-10-CM | POA: Diagnosis not present

## 2015-10-27 DIAGNOSIS — R300 Dysuria: Secondary | ICD-10-CM | POA: Diagnosis not present

## 2015-10-27 DIAGNOSIS — R5383 Other fatigue: Secondary | ICD-10-CM | POA: Diagnosis not present

## 2015-10-27 DIAGNOSIS — I480 Paroxysmal atrial fibrillation: Secondary | ICD-10-CM | POA: Diagnosis not present

## 2015-10-27 DIAGNOSIS — E559 Vitamin D deficiency, unspecified: Secondary | ICD-10-CM | POA: Diagnosis not present

## 2015-10-27 DIAGNOSIS — I5022 Chronic systolic (congestive) heart failure: Secondary | ICD-10-CM | POA: Diagnosis not present

## 2015-11-03 ENCOUNTER — Ambulatory Visit (INDEPENDENT_AMBULATORY_CARE_PROVIDER_SITE_OTHER): Payer: Commercial Managed Care - HMO | Admitting: Pharmacist Clinician (PhC)/ Clinical Pharmacy Specialist

## 2015-11-03 ENCOUNTER — Ambulatory Visit (INDEPENDENT_AMBULATORY_CARE_PROVIDER_SITE_OTHER): Payer: Commercial Managed Care - HMO | Admitting: Cardiovascular Disease

## 2015-11-03 ENCOUNTER — Encounter: Payer: Self-pay | Admitting: Cardiovascular Disease

## 2015-11-03 VITALS — BP 130/60 | HR 96 | Ht 61.0 in | Wt 154.7 lb

## 2015-11-03 DIAGNOSIS — I340 Nonrheumatic mitral (valve) insufficiency: Secondary | ICD-10-CM

## 2015-11-03 DIAGNOSIS — I482 Chronic atrial fibrillation, unspecified: Secondary | ICD-10-CM

## 2015-11-03 DIAGNOSIS — Z7901 Long term (current) use of anticoagulants: Secondary | ICD-10-CM

## 2015-11-03 DIAGNOSIS — R001 Bradycardia, unspecified: Secondary | ICD-10-CM | POA: Diagnosis not present

## 2015-11-03 DIAGNOSIS — Z950 Presence of cardiac pacemaker: Secondary | ICD-10-CM | POA: Diagnosis not present

## 2015-11-03 DIAGNOSIS — I4891 Unspecified atrial fibrillation: Secondary | ICD-10-CM

## 2015-11-03 LAB — POCT INR: INR: 2.3

## 2015-11-03 NOTE — Progress Notes (Signed)
Patient ID: [name], female   DOB: 12/06/1926, 81 y.o.   MRN: [id number]     Cardiology Office Note   Date:  11/05/2015   ID:  [name], DOB 1927-09-22, MRN [id number]  PCP:  Avon Gully, MD  Cardiologist:   Thurmon Fair, MD   Chief Complaint  Patient presents with  . Follow-up    Occas. chest discomfort, SOB with minimal activity      History of Present Illness: [name] is a 81 y.o. female who presents for atrial fibrillation slow ventricular response, pacemaker check.  Her dementia is progressing and she is slowing down. She looks disoriented and only uttered a couple of words today. About 2-3 weeks ago she was "very very tired" and we reduced the metoprolol dose to 12.5 mg daily, not sure if this has been beneficial. ED evaluation for altered mental status on Nov 7 was unrevealing. Normal blood tests, UA and CT head with brain atrophy and microvascular changes.  She has not had syncope or complained of dyspnea or palpitations. She is only taking a half tab of furosemide daily and her daughter increases it to a whole tab when she has ankle swelling, but this happens only once a month on the average. She does not have a history of stroke or TIA. She is on chronic warfarin anticoagulation and this has generally been fairly steady without bleeding complications or need for dosing changes. She is steady on her feet usually, but had a fall in late 2014 precipitated by UTI and diarrhea. She has not had any falls since then  Her Medtronic Sensia single chamber PM (2007)is functioning normally, 12 years generator longevity, 25% V pacing.Occasional mild high ventricular rates, generally satisfactory rate control.    Past Medical History  Diagnosis Date  . Dementia   . Pacemaker   . Hypertension   . Acid reflux   . Atrial fibrillation (HCC)   . Symptomatic bradycardia   . Hyperlipidemia   . Atrophic vaginitis   . Chronic cystitis   . Decubitus ulcer of buttock     . Incomplete bladder emptying   . Microscopic hematuria   . Straining on urination   . Urge incontinence of urine   . UTI (lower urinary tract infection)     Past Surgical History  Procedure Laterality Date  . Pacemaker insertion    . Pacemaker generator change N/A 07/05/2013    Procedure: PACEMAKER GENERATOR CHANGE;  Surgeon: Thurmon Fair, MD;  Location: [facility];  Service: Cardiovascular;  Laterality: N/A;  . Cardiac catheterization  04/2004     Current Outpatient Prescriptions  Medication Sig Dispense Refill  . cephALEXin (KEFLEX) 500 MG capsule Take 250 mg by mouth daily.     . cholecalciferol (VITAMIN D) 1000 UNITS tablet Take 1,000 Units by mouth daily.    . furosemide (LASIX) 40 MG tablet Take 40 mg by mouth daily.     Marland Kitchen HYDROcodone-acetaminophen (NORCO/VICODIN) 5-325 MG per tablet Take 1 tablet by mouth every 6 (six) hours as needed. 20 tablet 0  . metoprolol succinate (TOPROL XL) 25 MG 24 hr tablet Take 1 tablet (25 mg total) by mouth daily. 30 tablet 6  . Multiple Vitamins-Minerals (CENTRUM SILVER PO) Take 1 tablet by mouth daily.      Marland Kitchen omeprazole (PRILOSEC) 20 MG capsule Take 20 mg by mouth daily.      . potassium chloride SA (K-DUR,KLOR-CON) 20 MEQ tablet Take 1 tablet by mouth daily.    Marland Kitchen  simvastatin (ZOCOR) 10 MG tablet Take 10 mg by mouth daily.    . simvastatin (ZOCOR) 20 MG tablet Take 10 mg by mouth every evening.    Marland Kitchen. tetrahydrozoline (EYE DROPS) 0.05 % ophthalmic solution Place 1 drop into both eyes daily as needed. For dry eye relief    . Travoprost, BAK Free, (TRAVATAMN) 0.004 % SOLN ophthalmic solution Place 1 drop into both eyes at bedtime.     . traZODone (DESYREL) 50 MG tablet Take 50 mg by mouth at bedtime as needed for sleep.     Marland Kitchen. warfarin (COUMADIN) 3 MG tablet TAKE 1 TO 1 AND 1/2 TABLET BY MOUTH DAILY AS DIRECTED. 120 tablet 2   No current facility-administered medications for this visit.    Allergies:   Contrast media; Penicillins; and Sulfa  antibiotics    Social History:  The patient  reports that she has never smoked. She has never used smokeless tobacco. She reports that she does not drink alcohol or use illicit drugs.   Family History:  The patient's family history includes Cancer in her other.    ROS:  Please see the history of present illness.   ROS only from family report Otherwise, review of systems positive for none.   All other systems are reviewed and negative.    PHYSICAL EXAM: VS:  BP 130/60 mmHg  Pulse 96  Ht 5\' 1"  (1.549 m)  Wt 154 lb 11.2 oz (70.171 kg)  BMI 29.25 kg/m2 , BMI Body mass index is 29.25 kg/(m^2).  General: Alert, oriented x3, no distress Head: no evidence of trauma, PERRL, EOMI, no exophtalmos or lid lag, no myxedema, no xanthelasma; normal ears, nose and oropharynx Neck: normal jugular venous pulsations and no hepatojugular reflux; brisk carotid pulses without delay and no carotid bruits Chest: clear to auscultation, no signs of consolidation by percussion or palpation, normal fremitus, symmetrical and full respiratory excursions Cardiovascular: normal position and quality of the apical impulse, irregular rhythm, normal first and second heart sounds, apical 2/6 holosystolic murmur, no diastolic murmurs, rubs or gallops Abdomen: no tenderness or distention, no masses by palpation, no abnormal pulsatility or arterial bruits, normal bowel sounds, no hepatosplenomegaly Extremities: no clubbing, cyanosis or edema; 2+ radial, ulnar and brachial pulses bilaterally; 2+ right femoral, posterior tibial and dorsalis pedis pulses; 2+ left femoral, posterior tibial and dorsalis pedis pulses; no subclavian or femoral bruits Neurological: grossly nonfocal Psych: euthymic mood, full affect   EKG:  EKG is not ordered today.   Recent Labs: 10/05/2015: ALT 19; BUN 34*; Creatinine, Ser 0.94; Hemoglobin 12.4; Platelets 184; Potassium 4.2; Sodium 142    Lipid Panel No results found for: CHOL, TRIG, HDL,  CHOLHDL, VLDL, LDLCALC, LDLDIRECT    Wt Readings from Last 3 Encounters:  11/03/15 154 lb 11.2 oz (70.171 kg)  10/28/14 164 lb 1.6 oz (74.435 kg)  10/12/14 158 lb (71.668 kg)     ASSESSMENT AND PLAN:  1. atrial fibrillation with slow response, beta blocker dose reduced (in past she did have problems with orthostatic hypotension leading to a similar dose reduction). CHADSVasc 4. Continue warfarin, but need to reassess bleeding/fall risk periodically. Rate control fair, try to keep VR<110.  2. Normal single chamber pacemake function.. Carelink Q3 mos.  3. Moderate mitral insufficiency  4. Moderate to severe dementia    Current medicines are reviewed at length with the patient today.  The patient does not have concerns regarding medicines.  The following changes have been made:  no change  Labs/  tests ordered today include:  No orders of the defined types were placed in this encounter.     Patient Instructions  Remote monitoring is used to monitor your Pacemaker from home. This monitoring reduces the number of office visits required to check your device to one time per year. It allows [place] to monitor the functioning of your device to ensure it is working properly. You are scheduled for a device check from home on February 02, 2016 . You may send your transmission at any time that day. If you have a wireless device, the transmission will be sent automatically. After your physician reviews your transmission, you will receive a postcard with your next transmission date.  Dr. Royann Shivers recommends that you schedule a follow-up appointment in: ONE YEAR        SignedThurmon Fair, MD  11/05/2015 9:33 AM    Thurmon Fair, MD, [facility] [phone] office [phone] pager

## 2015-11-03 NOTE — Patient Instructions (Signed)
Remote monitoring is used to monitor your Pacemaker from home. This monitoring reduces the number of office visits required to check your device to one time per year. It allows us to monitor the functioning of your device to ensure it is working properly. You are scheduled for a device check from home on February 02, 2016 . You may send your transmission at any time that day. If you have a wireless device, the transmission will be sent automatically. After your physician reviews your transmission, you will receive a postcard with your next transmission date.  Dr. Royann Shiversroitoru recommends that you schedule a follow-up appointment in: ONE YEAR

## 2015-11-13 ENCOUNTER — Ambulatory Visit (INDEPENDENT_AMBULATORY_CARE_PROVIDER_SITE_OTHER): Payer: Commercial Managed Care - HMO | Admitting: Urology

## 2015-11-13 DIAGNOSIS — N3020 Other chronic cystitis without hematuria: Secondary | ICD-10-CM

## 2015-11-13 DIAGNOSIS — N3941 Urge incontinence: Secondary | ICD-10-CM

## 2015-11-26 LAB — CUP PACEART INCLINIC DEVICE CHECK
Battery Remaining Longevity: 145 mo
Battery Voltage: 2.8 V
Implantable Lead Implant Date: 20071023
Implantable Lead Location: 753860
Implantable Lead Model: 4092
Implantable Lead Model: 5594
Lead Channel Impedance Value: 67 Ohm
Lead Channel Setting Sensing Sensitivity: 5.6 mV
MDC IDC LEAD IMPLANT DT: 20071023
MDC IDC LEAD LOCATION: 753859
MDC IDC MSMT BATTERY IMPEDANCE: 149 Ohm
MDC IDC MSMT LEADCHNL RV IMPEDANCE VALUE: 772 Ohm
MDC IDC SESS DTM: 20161206161130
MDC IDC SET LEADCHNL RV PACING AMPLITUDE: 2 V
MDC IDC SET LEADCHNL RV PACING PULSEWIDTH: 0.4 ms
MDC IDC STAT BRADY RV PERCENT PACED: 25 %

## 2015-12-02 ENCOUNTER — Other Ambulatory Visit: Payer: Self-pay | Admitting: Cardiovascular Disease

## 2015-12-02 ENCOUNTER — Ambulatory Visit (INDEPENDENT_AMBULATORY_CARE_PROVIDER_SITE_OTHER): Payer: Commercial Managed Care - HMO | Admitting: Pharmacist Clinician (PhC)/ Clinical Pharmacy Specialist

## 2015-12-02 DIAGNOSIS — I482 Chronic atrial fibrillation, unspecified: Secondary | ICD-10-CM

## 2015-12-02 DIAGNOSIS — Z7901 Long term (current) use of anticoagulants: Secondary | ICD-10-CM | POA: Diagnosis not present

## 2015-12-02 DIAGNOSIS — I4891 Unspecified atrial fibrillation: Secondary | ICD-10-CM | POA: Diagnosis not present

## 2015-12-02 LAB — PT WITH INR/FINGERSTICK
INR, fingerstick: 2 — ABNORMAL HIGH (ref 0.80–1.20)
PT FINGERSTICK: 23.7 s — AB (ref 10.4–12.5)

## 2015-12-29 LAB — PROTIME-INR: INR: 2.2 — AB (ref <=1.1)

## 2015-12-30 ENCOUNTER — Ambulatory Visit (INDEPENDENT_AMBULATORY_CARE_PROVIDER_SITE_OTHER): Payer: Commercial Managed Care - HMO | Admitting: Pharmacist Clinician (PhC)/ Clinical Pharmacy Specialist

## 2015-12-30 ENCOUNTER — Other Ambulatory Visit: Payer: Self-pay | Admitting: Cardiovascular Disease

## 2015-12-30 DIAGNOSIS — Z7901 Long term (current) use of anticoagulants: Secondary | ICD-10-CM

## 2015-12-30 DIAGNOSIS — I4891 Unspecified atrial fibrillation: Secondary | ICD-10-CM | POA: Diagnosis not present

## 2015-12-30 DIAGNOSIS — I482 Chronic atrial fibrillation, unspecified: Secondary | ICD-10-CM

## 2015-12-30 LAB — PT WITH INR/FINGERSTICK
INR, fingerstick: 2.2 — ABNORMAL HIGH (ref 0.80–1.20)
PT, fingerstick: 25.8 seconds — ABNORMAL HIGH (ref 10.4–12.5)

## 2016-01-12 DIAGNOSIS — H353131 Nonexudative age-related macular degeneration, bilateral, early dry stage: Secondary | ICD-10-CM | POA: Diagnosis not present

## 2016-01-12 DIAGNOSIS — H401131 Primary open-angle glaucoma, bilateral, mild stage: Secondary | ICD-10-CM | POA: Diagnosis not present

## 2016-01-12 DIAGNOSIS — Z961 Presence of intraocular lens: Secondary | ICD-10-CM | POA: Diagnosis not present

## 2016-01-19 DIAGNOSIS — G301 Alzheimer's disease with late onset: Secondary | ICD-10-CM | POA: Diagnosis not present

## 2016-01-19 DIAGNOSIS — I5022 Chronic systolic (congestive) heart failure: Secondary | ICD-10-CM | POA: Diagnosis not present

## 2016-01-19 DIAGNOSIS — I10 Essential (primary) hypertension: Secondary | ICD-10-CM | POA: Diagnosis not present

## 2016-01-19 DIAGNOSIS — I480 Paroxysmal atrial fibrillation: Secondary | ICD-10-CM | POA: Diagnosis not present

## 2016-01-21 ENCOUNTER — Other Ambulatory Visit: Payer: Self-pay | Admitting: Cardiovascular Disease

## 2016-01-21 ENCOUNTER — Ambulatory Visit (INDEPENDENT_AMBULATORY_CARE_PROVIDER_SITE_OTHER): Payer: Commercial Managed Care - HMO | Admitting: Pharmacist Clinician (PhC)/ Clinical Pharmacy Specialist

## 2016-01-21 DIAGNOSIS — I482 Chronic atrial fibrillation, unspecified: Secondary | ICD-10-CM

## 2016-01-21 DIAGNOSIS — D649 Anemia, unspecified: Secondary | ICD-10-CM | POA: Diagnosis not present

## 2016-01-21 DIAGNOSIS — Z7901 Long term (current) use of anticoagulants: Secondary | ICD-10-CM

## 2016-01-21 DIAGNOSIS — I4891 Unspecified atrial fibrillation: Secondary | ICD-10-CM | POA: Diagnosis not present

## 2016-01-21 LAB — PT WITH INR/FINGERSTICK
INR, fingerstick: 2.3 — ABNORMAL HIGH (ref 0.80–1.20)
PT FINGERSTICK: 27.6 s — AB (ref 10.4–12.5)

## 2016-02-02 ENCOUNTER — Ambulatory Visit (INDEPENDENT_AMBULATORY_CARE_PROVIDER_SITE_OTHER): Payer: Commercial Managed Care - HMO | Admitting: *Deleted

## 2016-02-02 DIAGNOSIS — R001 Bradycardia, unspecified: Secondary | ICD-10-CM | POA: Diagnosis not present

## 2016-02-03 ENCOUNTER — Telehealth: Payer: Self-pay | Admitting: *Deleted

## 2016-02-03 NOTE — Telephone Encounter (Signed)
Spoke w/ pt daughter and informed her that pt remote transmission was received. She verbalized understanding.

## 2016-02-03 NOTE — Telephone Encounter (Signed)
New message   Pt daughter calling to see how to work device to transmit

## 2016-02-05 NOTE — Progress Notes (Signed)
Remote pacemaker transmission.   

## 2016-02-06 LAB — CUP PACEART REMOTE DEVICE CHECK
Date Time Interrogation Session: 20170308202542
Implantable Lead Implant Date: 20071023
Implantable Lead Location: 753859
Lead Channel Impedance Value: 67 Ohm
Lead Channel Impedance Value: 752 Ohm
Lead Channel Setting Pacing Amplitude: 2 V
Lead Channel Setting Pacing Pulse Width: 0.4 ms
MDC IDC LEAD IMPLANT DT: 20071023
MDC IDC LEAD LOCATION: 753860
MDC IDC MSMT BATTERY IMPEDANCE: 149 Ohm
MDC IDC MSMT BATTERY REMAINING LONGEVITY: 145 mo
MDC IDC MSMT BATTERY VOLTAGE: 2.8 V
MDC IDC SET LEADCHNL RV SENSING SENSITIVITY: 5.6 mV
MDC IDC STAT BRADY RV PERCENT PACED: 26 %

## 2016-02-06 NOTE — Progress Notes (Signed)
Normal remote reviewed. VHR episodes are AF with RVR Consider reprogramming VHR detection at next office visit   Next Carelink 05/03/16

## 2016-02-10 ENCOUNTER — Encounter: Payer: Self-pay | Admitting: Cardiology

## 2016-02-12 ENCOUNTER — Ambulatory Visit (INDEPENDENT_AMBULATORY_CARE_PROVIDER_SITE_OTHER): Payer: Commercial Managed Care - HMO | Admitting: Urology

## 2016-02-12 DIAGNOSIS — N3020 Other chronic cystitis without hematuria: Secondary | ICD-10-CM | POA: Diagnosis not present

## 2016-02-12 DIAGNOSIS — N3941 Urge incontinence: Secondary | ICD-10-CM

## 2016-02-18 ENCOUNTER — Other Ambulatory Visit: Payer: Self-pay | Admitting: Cardiovascular Disease

## 2016-02-18 DIAGNOSIS — Z7901 Long term (current) use of anticoagulants: Secondary | ICD-10-CM | POA: Diagnosis not present

## 2016-02-18 DIAGNOSIS — I4891 Unspecified atrial fibrillation: Secondary | ICD-10-CM | POA: Diagnosis not present

## 2016-02-18 LAB — PT WITH INR/FINGERSTICK
INR, fingerstick: 2 — ABNORMAL HIGH (ref 0.80–1.20)
PT, fingerstick: 23.9 seconds — ABNORMAL HIGH (ref 10.4–12.5)

## 2016-02-19 ENCOUNTER — Ambulatory Visit (INDEPENDENT_AMBULATORY_CARE_PROVIDER_SITE_OTHER): Payer: Commercial Managed Care - HMO | Admitting: Pharmacist Clinician (PhC)/ Clinical Pharmacy Specialist

## 2016-02-19 DIAGNOSIS — I482 Chronic atrial fibrillation, unspecified: Secondary | ICD-10-CM

## 2016-02-19 DIAGNOSIS — Z7901 Long term (current) use of anticoagulants: Secondary | ICD-10-CM

## 2016-02-23 ENCOUNTER — Ambulatory Visit (INDEPENDENT_AMBULATORY_CARE_PROVIDER_SITE_OTHER): Payer: Commercial Managed Care - HMO | Admitting: Urology

## 2016-02-23 DIAGNOSIS — N3941 Urge incontinence: Secondary | ICD-10-CM | POA: Diagnosis not present

## 2016-02-23 DIAGNOSIS — N3020 Other chronic cystitis without hematuria: Secondary | ICD-10-CM | POA: Diagnosis not present

## 2016-02-24 ENCOUNTER — Encounter: Payer: Self-pay | Admitting: Cardiology

## 2016-03-23 ENCOUNTER — Telehealth: Payer: Self-pay

## 2016-03-23 NOTE — Telephone Encounter (Signed)
Faxed signed INR standing order renewal form to [facility] in [place]. 

## 2016-03-24 ENCOUNTER — Ambulatory Visit (INDEPENDENT_AMBULATORY_CARE_PROVIDER_SITE_OTHER): Payer: Commercial Managed Care - HMO | Admitting: Pharmacist Clinician (PhC)/ Clinical Pharmacy Specialist

## 2016-03-24 ENCOUNTER — Other Ambulatory Visit: Payer: Self-pay | Admitting: Cardiovascular Disease

## 2016-03-24 DIAGNOSIS — I4891 Unspecified atrial fibrillation: Secondary | ICD-10-CM | POA: Diagnosis not present

## 2016-03-24 DIAGNOSIS — Z7901 Long term (current) use of anticoagulants: Secondary | ICD-10-CM

## 2016-03-24 DIAGNOSIS — I482 Chronic atrial fibrillation, unspecified: Secondary | ICD-10-CM

## 2016-03-24 LAB — PT WITH INR/FINGERSTICK
INR, fingerstick: 1.9 — ABNORMAL HIGH (ref 0.80–1.20)
PT, fingerstick: 23.1 seconds — ABNORMAL HIGH (ref 10.4–12.5)

## 2016-04-12 DIAGNOSIS — H401131 Primary open-angle glaucoma, bilateral, mild stage: Secondary | ICD-10-CM | POA: Diagnosis not present

## 2016-04-26 ENCOUNTER — Other Ambulatory Visit: Payer: Self-pay | Admitting: Cardiovascular Disease

## 2016-04-26 ENCOUNTER — Ambulatory Visit (INDEPENDENT_AMBULATORY_CARE_PROVIDER_SITE_OTHER): Payer: Commercial Managed Care - HMO | Admitting: Pharmacist Clinician (PhC)/ Clinical Pharmacy Specialist

## 2016-04-26 DIAGNOSIS — I5022 Chronic systolic (congestive) heart failure: Secondary | ICD-10-CM | POA: Diagnosis not present

## 2016-04-26 DIAGNOSIS — I4891 Unspecified atrial fibrillation: Secondary | ICD-10-CM | POA: Diagnosis not present

## 2016-04-26 DIAGNOSIS — Z7901 Long term (current) use of anticoagulants: Secondary | ICD-10-CM | POA: Diagnosis not present

## 2016-04-26 DIAGNOSIS — I482 Chronic atrial fibrillation, unspecified: Secondary | ICD-10-CM

## 2016-04-26 DIAGNOSIS — M810 Age-related osteoporosis without current pathological fracture: Secondary | ICD-10-CM | POA: Diagnosis not present

## 2016-04-26 DIAGNOSIS — I480 Paroxysmal atrial fibrillation: Secondary | ICD-10-CM | POA: Diagnosis not present

## 2016-04-26 LAB — PT WITH INR/FINGERSTICK
INR, fingerstick: 2.2 — ABNORMAL HIGH (ref 0.80–1.20)
PT, fingerstick: 25.8 seconds — ABNORMAL HIGH (ref 10.4–12.5)

## 2016-05-03 ENCOUNTER — Telehealth: Payer: Self-pay | Admitting: Cardiology

## 2016-05-03 ENCOUNTER — Encounter: Payer: Commercial Managed Care - HMO | Admitting: *Deleted

## 2016-05-03 NOTE — Telephone Encounter (Signed)
Confirmed remote transmission w/ pt caregiver.   

## 2016-05-06 ENCOUNTER — Encounter: Payer: Self-pay | Admitting: Cardiology

## 2016-05-23 ENCOUNTER — Telehealth: Payer: Self-pay | Admitting: Cardiovascular Disease

## 2016-05-23 NOTE — Telephone Encounter (Signed)
New message       1. Has your device fired? no  2. Is you device beeping? no  3. Are you experiencing draining or swelling at device site? no  4. Are you calling to see if we received your device transmission? The daughter is sending transmission forgot to do it earlier last week  5. Have you passed out? no

## 2016-05-23 NOTE — Telephone Encounter (Signed)
Spoke with Junious DresserJanet Arya- transmission was received and we will process it. She is appreciative of call.

## 2016-05-24 ENCOUNTER — Ambulatory Visit (INDEPENDENT_AMBULATORY_CARE_PROVIDER_SITE_OTHER): Payer: Commercial Managed Care - HMO | Admitting: *Deleted

## 2016-05-24 DIAGNOSIS — R001 Bradycardia, unspecified: Secondary | ICD-10-CM

## 2016-05-24 DIAGNOSIS — I482 Chronic atrial fibrillation, unspecified: Secondary | ICD-10-CM

## 2016-05-24 NOTE — Progress Notes (Signed)
Remote pacemaker transmission.   

## 2016-05-25 ENCOUNTER — Encounter: Payer: Self-pay | Admitting: Cardiology

## 2016-05-25 LAB — CUP PACEART REMOTE DEVICE CHECK
Battery Remaining Longevity: 140 mo
Battery Voltage: 2.8 V
Date Time Interrogation Session: 20170626175915
Implantable Lead Location: 753860
Implantable Lead Model: 4092
Implantable Lead Model: 5594
Lead Channel Pacing Threshold Amplitude: 0.625 V
Lead Channel Pacing Threshold Pulse Width: 0.4 ms
Lead Channel Setting Sensing Sensitivity: 5.6 mV
MDC IDC LEAD IMPLANT DT: 20071023
MDC IDC LEAD IMPLANT DT: 20071023
MDC IDC LEAD LOCATION: 753859
MDC IDC MSMT BATTERY IMPEDANCE: 174 Ohm
MDC IDC MSMT LEADCHNL RA IMPEDANCE VALUE: 67 Ohm
MDC IDC MSMT LEADCHNL RV IMPEDANCE VALUE: 790 Ohm
MDC IDC MSMT LEADCHNL RV SENSING INTR AMPL: 11.2 mV
MDC IDC SET LEADCHNL RV PACING AMPLITUDE: 2 V
MDC IDC SET LEADCHNL RV PACING PULSEWIDTH: 0.4 ms
MDC IDC STAT BRADY RV PERCENT PACED: 27 %

## 2016-05-27 ENCOUNTER — Ambulatory Visit (INDEPENDENT_AMBULATORY_CARE_PROVIDER_SITE_OTHER): Payer: Commercial Managed Care - HMO | Admitting: Urology

## 2016-05-27 ENCOUNTER — Other Ambulatory Visit: Payer: Self-pay | Admitting: Cardiovascular Disease

## 2016-05-27 DIAGNOSIS — N3941 Urge incontinence: Secondary | ICD-10-CM | POA: Diagnosis not present

## 2016-05-27 DIAGNOSIS — N3020 Other chronic cystitis without hematuria: Secondary | ICD-10-CM

## 2016-05-27 DIAGNOSIS — I4891 Unspecified atrial fibrillation: Secondary | ICD-10-CM | POA: Diagnosis not present

## 2016-05-27 DIAGNOSIS — Z7901 Long term (current) use of anticoagulants: Secondary | ICD-10-CM | POA: Diagnosis not present

## 2016-05-27 LAB — PT WITH INR/FINGERSTICK
INR, fingerstick: 2.3 — ABNORMAL HIGH (ref 0.80–1.20)
PT, fingerstick: 27.6 seconds — ABNORMAL HIGH (ref 10.4–12.5)

## 2016-05-30 ENCOUNTER — Ambulatory Visit (INDEPENDENT_AMBULATORY_CARE_PROVIDER_SITE_OTHER): Payer: Commercial Managed Care - HMO | Admitting: Pharmacist Clinician (PhC)/ Clinical Pharmacy Specialist

## 2016-05-30 DIAGNOSIS — Z7901 Long term (current) use of anticoagulants: Secondary | ICD-10-CM

## 2016-05-30 DIAGNOSIS — I482 Chronic atrial fibrillation, unspecified: Secondary | ICD-10-CM

## 2016-06-06 ENCOUNTER — Other Ambulatory Visit: Payer: Self-pay | Admitting: Cardiovascular Disease

## 2016-06-08 ENCOUNTER — Encounter: Payer: Self-pay | Admitting: Cardiology

## 2016-06-27 ENCOUNTER — Other Ambulatory Visit: Payer: Self-pay | Admitting: Cardiovascular Disease

## 2016-06-27 ENCOUNTER — Ambulatory Visit (INDEPENDENT_AMBULATORY_CARE_PROVIDER_SITE_OTHER): Payer: Commercial Managed Care - HMO | Admitting: Pharmacist Clinician (PhC)/ Clinical Pharmacy Specialist

## 2016-06-27 DIAGNOSIS — Z7901 Long term (current) use of anticoagulants: Secondary | ICD-10-CM

## 2016-06-27 DIAGNOSIS — I482 Chronic atrial fibrillation, unspecified: Secondary | ICD-10-CM

## 2016-06-27 DIAGNOSIS — R300 Dysuria: Secondary | ICD-10-CM | POA: Diagnosis not present

## 2016-06-27 DIAGNOSIS — I4891 Unspecified atrial fibrillation: Secondary | ICD-10-CM | POA: Diagnosis not present

## 2016-06-27 LAB — PT WITH INR/FINGERSTICK
INR FINGERSTICK: 2.9 — AB (ref 0.80–1.20)
PT, fingerstick: 34.4 seconds — ABNORMAL HIGH (ref 10.4–12.5)

## 2016-07-26 DIAGNOSIS — G301 Alzheimer's disease with late onset: Secondary | ICD-10-CM | POA: Diagnosis not present

## 2016-07-26 DIAGNOSIS — I480 Paroxysmal atrial fibrillation: Secondary | ICD-10-CM | POA: Diagnosis not present

## 2016-07-26 DIAGNOSIS — I5022 Chronic systolic (congestive) heart failure: Secondary | ICD-10-CM | POA: Diagnosis not present

## 2016-07-26 DIAGNOSIS — M810 Age-related osteoporosis without current pathological fracture: Secondary | ICD-10-CM | POA: Diagnosis not present

## 2016-07-29 ENCOUNTER — Other Ambulatory Visit: Payer: Self-pay | Admitting: Cardiovascular Disease

## 2016-07-29 DIAGNOSIS — Z7901 Long term (current) use of anticoagulants: Secondary | ICD-10-CM | POA: Diagnosis not present

## 2016-07-29 DIAGNOSIS — I4891 Unspecified atrial fibrillation: Secondary | ICD-10-CM | POA: Diagnosis not present

## 2016-07-29 DIAGNOSIS — I10 Essential (primary) hypertension: Secondary | ICD-10-CM | POA: Diagnosis not present

## 2016-07-29 DIAGNOSIS — G301 Alzheimer's disease with late onset: Secondary | ICD-10-CM | POA: Diagnosis not present

## 2016-07-29 DIAGNOSIS — M810 Age-related osteoporosis without current pathological fracture: Secondary | ICD-10-CM | POA: Diagnosis not present

## 2016-07-29 DIAGNOSIS — I5022 Chronic systolic (congestive) heart failure: Secondary | ICD-10-CM | POA: Diagnosis not present

## 2016-07-29 LAB — PT WITH INR/FINGERSTICK
INR, fingerstick: 2.2 — ABNORMAL HIGH (ref 0.80–1.20)
PT FINGERSTICK: 26.9 s — AB (ref 10.4–12.5)

## 2016-08-19 ENCOUNTER — Other Ambulatory Visit: Payer: Self-pay | Admitting: Cardiovascular Disease

## 2016-08-19 ENCOUNTER — Other Ambulatory Visit (HOSPITAL_COMMUNITY)
Admission: AD | Admit: 2016-08-19 | Discharge: 2016-08-19 | Disposition: A | Discharge status: Home / Self Care | Payer: Commercial Managed Care - HMO | Source: Other Acute Inpatient Hospital | Attending: Urology | Admitting: Urology

## 2016-08-19 ENCOUNTER — Ambulatory Visit (INDEPENDENT_AMBULATORY_CARE_PROVIDER_SITE_OTHER): Payer: Commercial Managed Care - HMO | Admitting: Urology

## 2016-08-19 ENCOUNTER — Ambulatory Visit (INDEPENDENT_AMBULATORY_CARE_PROVIDER_SITE_OTHER): Payer: Commercial Managed Care - HMO | Admitting: Pharmacist

## 2016-08-19 DIAGNOSIS — N200 Calculus of kidney: Secondary | ICD-10-CM

## 2016-08-19 DIAGNOSIS — N3020 Other chronic cystitis without hematuria: Secondary | ICD-10-CM | POA: Diagnosis not present

## 2016-08-19 DIAGNOSIS — I4891 Unspecified atrial fibrillation: Secondary | ICD-10-CM | POA: Diagnosis not present

## 2016-08-19 DIAGNOSIS — Z7901 Long term (current) use of anticoagulants: Secondary | ICD-10-CM | POA: Diagnosis not present

## 2016-08-19 DIAGNOSIS — R310 Gross hematuria: Secondary | ICD-10-CM | POA: Diagnosis not present

## 2016-08-19 DIAGNOSIS — I482 Chronic atrial fibrillation, unspecified: Secondary | ICD-10-CM

## 2016-08-19 LAB — URINALYSIS, ROUTINE W REFLEX MICROSCOPIC
BILIRUBIN URINE: NEGATIVE
Glucose, UA: NEGATIVE mg/dL
KETONES UR: NEGATIVE mg/dL
Leukocytes, UA: NEGATIVE
NITRITE: NEGATIVE
PROTEIN: 100 mg/dL — AB
SPECIFIC GRAVITY, URINE: 1.02 (ref 1.005–1.030)
pH: 6 (ref 5.0–8.0)

## 2016-08-19 LAB — URINE MICROSCOPIC-ADD ON

## 2016-08-19 LAB — PT WITH INR/FINGERSTICK
INR FINGERSTICK: 2.7 — AB (ref 0.80–1.20)
PT FINGERSTICK: 32.7 s — AB (ref 10.4–12.5)

## 2016-08-21 LAB — URINE CULTURE: Culture: NO GROWTH

## 2016-08-23 ENCOUNTER — Telehealth: Payer: Self-pay | Admitting: Cardiology

## 2016-08-23 ENCOUNTER — Ambulatory Visit (INDEPENDENT_AMBULATORY_CARE_PROVIDER_SITE_OTHER): Payer: Commercial Managed Care - HMO | Admitting: *Deleted

## 2016-08-23 DIAGNOSIS — H401131 Primary open-angle glaucoma, bilateral, mild stage: Secondary | ICD-10-CM | POA: Diagnosis not present

## 2016-08-23 DIAGNOSIS — I4891 Unspecified atrial fibrillation: Secondary | ICD-10-CM

## 2016-08-23 NOTE — Telephone Encounter (Signed)
LMOVM reminding pt to send remote transmission.   

## 2016-08-23 NOTE — Progress Notes (Signed)
Remote pacemaker transmission.   

## 2016-08-24 ENCOUNTER — Encounter: Payer: Self-pay | Admitting: Cardiology

## 2016-08-29 ENCOUNTER — Other Ambulatory Visit: Payer: Self-pay | Admitting: Urology

## 2016-08-29 DIAGNOSIS — R310 Gross hematuria: Secondary | ICD-10-CM

## 2016-08-29 DIAGNOSIS — N200 Calculus of kidney: Secondary | ICD-10-CM

## 2016-09-07 ENCOUNTER — Encounter: Payer: Self-pay | Admitting: Cardiology

## 2016-09-09 ENCOUNTER — Ambulatory Visit (HOSPITAL_COMMUNITY)
Admission: RE | Admit: 2016-09-09 | Discharge: 2016-09-09 | Disposition: A | Discharge status: Home / Self Care | Payer: Commercial Managed Care - HMO | Source: Ambulatory Visit | Attending: Urology | Admitting: Urology

## 2016-09-09 DIAGNOSIS — J90 Pleural effusion, not elsewhere classified: Secondary | ICD-10-CM | POA: Insufficient documentation

## 2016-09-09 DIAGNOSIS — I700 Atherosclerosis of aorta: Secondary | ICD-10-CM | POA: Insufficient documentation

## 2016-09-09 DIAGNOSIS — R310 Gross hematuria: Secondary | ICD-10-CM | POA: Diagnosis not present

## 2016-09-09 DIAGNOSIS — M5136 Other intervertebral disc degeneration, lumbar region: Secondary | ICD-10-CM | POA: Insufficient documentation

## 2016-09-09 DIAGNOSIS — M4854XA Collapsed vertebra, not elsewhere classified, thoracic region, initial encounter for fracture: Secondary | ICD-10-CM | POA: Insufficient documentation

## 2016-09-09 DIAGNOSIS — Z90710 Acquired absence of both cervix and uterus: Secondary | ICD-10-CM | POA: Diagnosis not present

## 2016-09-09 DIAGNOSIS — I2510 Atherosclerotic heart disease of native coronary artery without angina pectoris: Secondary | ICD-10-CM | POA: Insufficient documentation

## 2016-09-09 DIAGNOSIS — J984 Other disorders of lung: Secondary | ICD-10-CM | POA: Diagnosis not present

## 2016-09-09 DIAGNOSIS — I517 Cardiomegaly: Secondary | ICD-10-CM | POA: Diagnosis not present

## 2016-09-09 DIAGNOSIS — N323 Diverticulum of bladder: Secondary | ICD-10-CM | POA: Insufficient documentation

## 2016-09-09 DIAGNOSIS — R938 Abnormal findings on diagnostic imaging of other specified body structures: Secondary | ICD-10-CM | POA: Diagnosis not present

## 2016-09-09 DIAGNOSIS — N200 Calculus of kidney: Secondary | ICD-10-CM

## 2016-09-20 ENCOUNTER — Other Ambulatory Visit: Payer: Self-pay | Admitting: Cardiovascular Disease

## 2016-09-20 ENCOUNTER — Ambulatory Visit (INDEPENDENT_AMBULATORY_CARE_PROVIDER_SITE_OTHER): Payer: Commercial Managed Care - HMO | Admitting: Pharmacist

## 2016-09-20 DIAGNOSIS — I482 Chronic atrial fibrillation, unspecified: Secondary | ICD-10-CM

## 2016-09-20 DIAGNOSIS — Z7901 Long term (current) use of anticoagulants: Secondary | ICD-10-CM | POA: Diagnosis not present

## 2016-09-20 DIAGNOSIS — I4891 Unspecified atrial fibrillation: Secondary | ICD-10-CM | POA: Diagnosis not present

## 2016-09-20 LAB — CUP PACEART REMOTE DEVICE CHECK
Battery Impedance: 174 Ohm
Brady Statistic RV Percent Paced: 28 %
Date Time Interrogation Session: 20170926153819
Implantable Lead Implant Date: 20071023
Implantable Lead Location: 753859
Implantable Lead Location: 753860
Implantable Lead Model: 4092
Lead Channel Impedance Value: 731 Ohm
Lead Channel Sensing Intrinsic Amplitude: 11.2 mV
Lead Channel Setting Pacing Amplitude: 2 V
Lead Channel Setting Pacing Pulse Width: 0.4 ms
MDC IDC LEAD IMPLANT DT: 20071023
MDC IDC MSMT BATTERY REMAINING LONGEVITY: 139 mo
MDC IDC MSMT BATTERY VOLTAGE: 2.79 V
MDC IDC MSMT LEADCHNL RA IMPEDANCE VALUE: 67 Ohm
MDC IDC MSMT LEADCHNL RV PACING THRESHOLD AMPLITUDE: 0.625 V
MDC IDC MSMT LEADCHNL RV PACING THRESHOLD PULSEWIDTH: 0.4 ms
MDC IDC SET LEADCHNL RV SENSING SENSITIVITY: 5.6 mV

## 2016-09-20 LAB — PT WITH INR/FINGERSTICK
INR, fingerstick: 2.4 — ABNORMAL HIGH (ref 0.80–1.20)
PT, fingerstick: 29.1 seconds — ABNORMAL HIGH (ref 10.4–12.5)

## 2016-09-22 ENCOUNTER — Other Ambulatory Visit: Payer: Self-pay | Admitting: Cardiovascular Disease

## 2016-09-22 ENCOUNTER — Telehealth: Payer: Self-pay

## 2016-09-22 ENCOUNTER — Telehealth: Payer: Self-pay | Admitting: Cardiology

## 2016-09-22 NOTE — Telephone Encounter (Signed)
Faxed signed INR standing order renewal form to [facility] in [place]. 

## 2016-09-22 NOTE — Telephone Encounter (Signed)
Spoke w/ pt daughter and informed her that pt ppm will be checked in January 2018 when pt see MD. Pt daughter verbalized understanding.

## 2016-10-04 ENCOUNTER — Ambulatory Visit (INDEPENDENT_AMBULATORY_CARE_PROVIDER_SITE_OTHER): Payer: Commercial Managed Care - HMO | Admitting: Cardiovascular Disease

## 2016-10-04 ENCOUNTER — Encounter: Payer: Self-pay | Admitting: Cardiovascular Disease

## 2016-10-04 VITALS — BP 132/62 | HR 66 | Ht 63.0 in | Wt 163.8 lb

## 2016-10-04 DIAGNOSIS — I482 Chronic atrial fibrillation, unspecified: Secondary | ICD-10-CM

## 2016-10-04 DIAGNOSIS — Z7901 Long term (current) use of anticoagulants: Secondary | ICD-10-CM | POA: Diagnosis not present

## 2016-10-04 DIAGNOSIS — I340 Nonrheumatic mitral (valve) insufficiency: Secondary | ICD-10-CM | POA: Diagnosis not present

## 2016-10-04 DIAGNOSIS — Z950 Presence of cardiac pacemaker: Secondary | ICD-10-CM

## 2016-10-04 MED ORDER — METOPROLOL SUCCINATE ER 50 MG PO TB24: 50.0000 mg | ORAL_TABLET | Freq: Every day | ORAL | 11 refills | Status: DC

## 2016-10-04 NOTE — Progress Notes (Signed)
Cardiology Office Note    Date:  10/04/2016   ID:  [name], DOB 1927-03-30, MRN [id number]  PCP:  Avon GullyFANTA,TESFAYE, MD  Cardiologist:   Thurmon FairMihai Nyoka Alcoser, MD   Chief Complaint  Patient presents with  . Follow-up    patient reports fatigue, otherwise, no complaints    History of Present Illness:  Norma Picketthelma D [place] is a 82 y.o. female with long-standing permanent atrial fibrillation with slow ventricular response and a single chamber permanent pacemaker, here for routine follow-up. She is is accompanied as always by her daughter. Dementia is slowly progressing. She did say a few words today, telling me that she wants to go home.  At her previous appointment we had tried decreasing her beta blocker due to complaints of fatigue. Interrogation of her pacemaker today shows that she has had relatively frequent episodes of high ventricular rates in the 100-1 130 bpm range since that reduction in dosage. Her Medtronic Sensia single chamber device was implanted in 2014 (initial device 2017) and still has an estimated 11 years of generator longevity. There is currently only 29% ventricular pacing.  To her dementia, the review of systems is very limited. She mostly complains of being tired all the time but seems to deny angina, dyspnea, focal neurological complaints. She has not had overt bleeding.  Past Medical History:  Diagnosis Date  . Acid reflux   . Atrial fibrillation (HCC)   . Atrophic vaginitis   . Chronic cystitis   . Decubitus ulcer of buttock   . Dementia   . Hyperlipidemia   . Hypertension   . Incomplete bladder emptying   . Microscopic hematuria   . Pacemaker   . Straining on urination   . Symptomatic bradycardia   . Urge incontinence of urine   . UTI (lower urinary tract infection)     Past Surgical History:  Procedure Laterality Date  . CARDIAC CATHETERIZATION  04/2004  . PACEMAKER GENERATOR CHANGE N/A 07/05/2013   Procedure: PACEMAKER GENERATOR CHANGE;  Surgeon: Thurmon FairMihai  Lawanna Cecere, MD;  Location: [facility];  Service: Cardiovascular;  Laterality: N/A;  . PACEMAKER INSERTION      Current Medications: Outpatient Medications Prior to Visit  Medication Sig Dispense Refill  . furosemide (LASIX) 40 MG tablet Take 40 mg by mouth daily.     . Multiple Vitamins-Minerals (CENTRUM SILVER PO) Take 1 tablet by mouth daily.      Marland Kitchen. omeprazole (PRILOSEC) 20 MG capsule Take 20 mg by mouth daily.      . potassium chloride SA (K-DUR,KLOR-CON) 20 MEQ tablet Take 1 tablet by mouth daily.    . simvastatin (ZOCOR) 10 MG tablet Take 10 mg by mouth daily.    Marland Kitchen. warfarin (COUMADIN) 3 MG tablet TAKE 1 TO 1 AND 1/2 TABLET BY MOUTH DAILY AS DIRECTED. 120 tablet 0  . HYDROcodone-acetaminophen (NORCO/VICODIN) 5-325 MG per tablet Take 1 tablet by mouth every 6 (six) hours as needed. 20 tablet 0  . metoprolol succinate (TOPROL-XL) 25 MG 24 hr tablet TAKE 1 TABLET BY MOUTH DAILY. 30 tablet 0  . cephALEXin (KEFLEX) 500 MG capsule Take 250 mg by mouth daily.     . cholecalciferol (VITAMIN D) 1000 UNITS tablet Take 1,000 Units by mouth daily.    . simvastatin (ZOCOR) 20 MG tablet Take 10 mg by mouth every evening.    Marland Kitchen. tetrahydrozoline (EYE DROPS) 0.05 % ophthalmic solution Place 1 drop into both eyes daily as needed. For dry eye relief    .  Travoprost, BAK Free, (TRAVATAMN) 0.004 % SOLN ophthalmic solution Place 1 drop into both eyes at bedtime.     . traZODone (DESYREL) 50 MG tablet Take 50 mg by mouth at bedtime as needed for sleep.      No facility-administered medications prior to visit.      Allergies:   Contrast media [iodinated diagnostic agents]; Penicillins; and Sulfa antibiotics   Social History   Social History  . Marital status: Widowed    Spouse name: N/A  . Number of children: N/A  . Years of education: N/A   Social History Main Topics  . Smoking status: Never Smoker  . Smokeless tobacco: Never Used  . Alcohol use No  . Drug use: No  . Sexual activity: No   Other  Topics Concern  . Not on file   Social History Narrative  . No narrative on file     Family History:  The patient's family history includes Cancer in her other.   ROS:   Please see the history of present illness.    ROS All other systems reviewed and are negative.   PHYSICAL EXAM:   VS:  BP 132/62   Pulse 66   Ht 5\' 3"  (1.6 m)   Wt 163 lb 12.8 oz (74.3 kg)   BMI 29.02 kg/m    GEN: Well nourished, well developed, in no acute distress  HEENT: normal  Neck: no JVD, carotid bruits, or masses Cardiac: irregular, 2/6 apical holosystolic murmur, no diastolic murmurs, rubs, or gallops,no edema  Respiratory:  clear to auscultation bilaterally, normal work of breathing GI: soft, nontender, nondistended, + BS MS: no deformity or atrophy  Skin: warm and dry, no rash Neuro:  Alert and Oriented x 3, Strength and sensation are intact Psych: euthymic mood, full affect  Wt Readings from Last 3 Encounters:  10/04/16 163 lb 12.8 oz (74.3 kg)  11/03/15 154 lb 11.2 oz (70.2 kg)  10/28/14 164 lb 1.6 oz (74.4 kg)      Studies/Labs Reviewed:   EKG:  EKG is ordered today.  The ekg ordered today demonstrates Atrial fibrillation with occasional ventricular paced beats, left ventricular hypertrophy with repolarization abnormalities    ASSESSMENT:    1. Chronic atrial fibrillation (HCC)   2. Pacemaker   3. Long term current use of anticoagulant therapy   4. Non-rheumatic mitral regurgitation      PLAN:  In order of problems listed above:  1. AFib: Ventricular rate control is poor. Will increase the beta blocker dose. Continue warfarin anticoagulation. 2. PPM: Mode downloads every 3 months and yearly office visits 3. Warfarin: CHADSVasc 4. Continue warfarin, but need to reassess bleeding/fall risk periodically 4. Moderate MR, not a surgical candidate 5. Dementia    Medication Adjustments/Labs and Tests Ordered: Current medicines are reviewed at length with the patient today.   Concerns regarding medicines are outlined above.  Medication changes, Labs and Tests ordered today are listed in the Patient Instructions below. Patient Instructions  Dr Royann Shiversroitoru has recommended making the following medication changes: 1. INCREASE Metoprolol Succinate to 50 mg daily  Remote monitoring is used to monitor your Pacemaker of ICD from home. This monitoring reduces the number of office visits required to check your device to one time per year. It allows us to keep an eye on the functioning of your device to ensure it is working properly. You are scheduled for a device check from home on Tuesday, February 6th, 2018. You may send your transmission at any  time that day. If you have a wireless device, the transmission will be sent automatically. After your physician reviews your transmission, you will receive a postcard with your next transmission date.  Dr Royann Shivers recommends that you schedule a follow-up appointment in 12 months with a pacemaker check. You will receive a reminder letter in the mail two months in advance. If you don't receive a letter, please call our office to schedule the follow-up appointment.  If you need a refill on your cardiac medications before your next appointment, please call your pharmacy.    Signed, Thurmon Fair, MD  10/04/2016 3:01 PM    [facility] [address] [place], [place], [place]  [address] Phone: [phone]; Fax: [phone]

## 2016-10-04 NOTE — Patient Instructions (Signed)
Dr Royann Shiversroitoru has recommended making the following medication changes: 1. INCREASE Metoprolol Succinate to 50 mg daily  Remote monitoring is used to monitor your Pacemaker of ICD from home. This monitoring reduces the number of office visits required to check your device to one time per year. It allows us to keep an eye on the functioning of your device to ensure it is working properly. You are scheduled for a device check from home on Tuesday, February 6th, 2018. You may send your transmission at any time that day. If you have a wireless device, the transmission will be sent automatically. After your physician reviews your transmission, you will receive a postcard with your next transmission date.  Dr Royann Shiversroitoru recommends that you schedule a follow-up appointment in 12 months with a pacemaker check. You will receive a reminder letter in the mail two months in advance. If you don't receive a letter, please call our office to schedule the follow-up appointment.  If you need a refill on your cardiac medications before your next appointment, please call your pharmacy.

## 2016-10-10 ENCOUNTER — Telehealth: Payer: Self-pay | Admitting: Cardiovascular Disease

## 2016-10-10 MED ORDER — METOPROLOL SUCCINATE ER 25 MG PO TB24: 37.5000 mg | ORAL_TABLET | Freq: Every day | ORAL | 3 refills | Status: DC

## 2016-10-10 NOTE — Telephone Encounter (Signed)
Recommendations communicated, Rx sent for new dose. Daughter aware to call if any needs, she verbalized understanding of all instructions given and thanks for call.

## 2016-10-10 NOTE — Telephone Encounter (Signed)
Understood.  Let's do metoprolol 37.5 mg twice daily, please.

## 2016-10-10 NOTE — Telephone Encounter (Signed)
[name] is calling because Dr. Royann Shiversroitoru doubled her medication Metoprolol from 25mg  to 50mg  and after she took it she became confused , since that she is taking 1 1/2 pills , but wants to know if that is ok . Also if you have the results from her Urologist . Please call   Thanks

## 2016-10-10 NOTE — Telephone Encounter (Signed)
Spoke to daughter. Concerning urology findings, had a CT last month ordered by Dr. Annabell HowellsWrenn. She will follow up with their office on these results.  Concerning metoprolol, daughter states pt became confused night she started increased metoprolol. Daughter got scared and held next day's dose. She restarted her at a pill and a half (37.5mg  daily) and patient seems to be doing OK at this dose. She wants to know if advised to just stay at this dose, go back to 25mg , or taper up to 50mg  after a few days. Aware I will seek advice from Dr. Royann Shiversroitoru and f/u. She voiced thanks.

## 2016-10-12 DIAGNOSIS — N3020 Other chronic cystitis without hematuria: Secondary | ICD-10-CM | POA: Diagnosis not present

## 2016-10-13 ENCOUNTER — Other Ambulatory Visit: Payer: Self-pay | Admitting: Cardiovascular Disease

## 2016-10-17 ENCOUNTER — Ambulatory Visit (INDEPENDENT_AMBULATORY_CARE_PROVIDER_SITE_OTHER): Payer: Commercial Managed Care - HMO | Admitting: Pharmacist

## 2016-10-17 ENCOUNTER — Other Ambulatory Visit: Payer: Self-pay | Admitting: Cardiovascular Disease

## 2016-10-17 DIAGNOSIS — Z7901 Long term (current) use of anticoagulants: Secondary | ICD-10-CM | POA: Diagnosis not present

## 2016-10-17 DIAGNOSIS — I4891 Unspecified atrial fibrillation: Secondary | ICD-10-CM | POA: Diagnosis not present

## 2016-10-17 DIAGNOSIS — I482 Chronic atrial fibrillation, unspecified: Secondary | ICD-10-CM

## 2016-10-17 LAB — PT WITH INR/FINGERSTICK
INR FINGERSTICK: 2.6 — AB (ref 0.9–1.1)
PT, fingerstick: 31.6 seconds — ABNORMAL HIGH (ref 10.5–13.1)

## 2016-10-26 DIAGNOSIS — N390 Urinary tract infection, site not specified: Secondary | ICD-10-CM | POA: Diagnosis not present

## 2016-10-26 LAB — POCT INR: INR: 3

## 2016-10-27 ENCOUNTER — Ambulatory Visit (INDEPENDENT_AMBULATORY_CARE_PROVIDER_SITE_OTHER): Payer: Commercial Managed Care - HMO | Admitting: Pharmacist

## 2016-10-27 ENCOUNTER — Telehealth: Payer: Self-pay | Admitting: Cardiovascular Disease

## 2016-10-27 DIAGNOSIS — Z7901 Long term (current) use of anticoagulants: Secondary | ICD-10-CM

## 2016-10-27 DIAGNOSIS — I482 Chronic atrial fibrillation, unspecified: Secondary | ICD-10-CM

## 2016-10-27 NOTE — Telephone Encounter (Signed)
See anticoag note from 11/29 for dosing.

## 2016-10-27 NOTE — Telephone Encounter (Signed)
Spoke with daughter-[name] Katrinka BlazingSmith  She states that pt had appt yesterday with Dr Annabell HowellsWrenn [phone]) and they told her to call here because pt finger stick PT/INR was high. She does not know what it was but states that pt needs coumadin dosing.  Spoke with Dr Belva CromeWrenn's [place] she states that pt went to [facility] and the PT was 36.2 and the INR was 3.0 she will fax over results for scanning

## 2016-10-27 NOTE — Telephone Encounter (Signed)
New Message   Pt voiced wanting to know if pt needs to take any blood thinner due to her levels being high yesterday.  Please f/u with pt

## 2016-10-28 DIAGNOSIS — I480 Paroxysmal atrial fibrillation: Secondary | ICD-10-CM | POA: Diagnosis not present

## 2016-10-28 DIAGNOSIS — G301 Alzheimer's disease with late onset: Secondary | ICD-10-CM | POA: Diagnosis not present

## 2016-10-28 DIAGNOSIS — M810 Age-related osteoporosis without current pathological fracture: Secondary | ICD-10-CM | POA: Diagnosis not present

## 2016-10-28 DIAGNOSIS — I5022 Chronic systolic (congestive) heart failure: Secondary | ICD-10-CM | POA: Diagnosis not present

## 2016-11-23 ENCOUNTER — Other Ambulatory Visit: Payer: Self-pay | Admitting: Cardiovascular Disease

## 2016-11-23 DIAGNOSIS — I4891 Unspecified atrial fibrillation: Secondary | ICD-10-CM | POA: Diagnosis not present

## 2016-11-23 DIAGNOSIS — Z7901 Long term (current) use of anticoagulants: Secondary | ICD-10-CM | POA: Diagnosis not present

## 2016-11-23 LAB — PT WITH INR/FINGERSTICK
INR FINGERSTICK: 2.8 — AB (ref 0.9–1.1)
PT, fingerstick: 33.6 seconds — ABNORMAL HIGH (ref 10.5–13.1)

## 2016-11-24 ENCOUNTER — Ambulatory Visit (INDEPENDENT_AMBULATORY_CARE_PROVIDER_SITE_OTHER): Payer: Commercial Managed Care - HMO | Admitting: Pharmacist

## 2016-11-24 DIAGNOSIS — I482 Chronic atrial fibrillation, unspecified: Secondary | ICD-10-CM

## 2016-11-24 DIAGNOSIS — Z7901 Long term (current) use of anticoagulants: Secondary | ICD-10-CM

## 2016-11-29 DIAGNOSIS — H401131 Primary open-angle glaucoma, bilateral, mild stage: Secondary | ICD-10-CM | POA: Diagnosis not present

## 2016-12-02 ENCOUNTER — Encounter: Payer: Commercial Managed Care - HMO | Admitting: Cardiovascular Disease

## 2016-12-20 ENCOUNTER — Other Ambulatory Visit (HOSPITAL_COMMUNITY): Payer: Self-pay | Admitting: Podiatry

## 2016-12-20 ENCOUNTER — Ambulatory Visit (HOSPITAL_COMMUNITY)
Admission: RE | Admit: 2016-12-20 | Discharge: 2016-12-20 | Disposition: A | Discharge status: Home / Self Care | Payer: Commercial Managed Care - HMO | Source: Ambulatory Visit | Attending: Podiatry | Admitting: Podiatry

## 2016-12-20 DIAGNOSIS — X58XXXA Exposure to other specified factors, initial encounter: Secondary | ICD-10-CM | POA: Diagnosis not present

## 2016-12-20 DIAGNOSIS — M79672 Pain in left foot: Secondary | ICD-10-CM

## 2016-12-20 DIAGNOSIS — M84475A Pathological fracture, left foot, initial encounter for fracture: Secondary | ICD-10-CM | POA: Insufficient documentation

## 2016-12-20 DIAGNOSIS — M25572 Pain in left ankle and joints of left foot: Secondary | ICD-10-CM

## 2016-12-20 DIAGNOSIS — S92342D Displaced fracture of fourth metatarsal bone, left foot, subsequent encounter for fracture with routine healing: Secondary | ICD-10-CM | POA: Diagnosis not present

## 2016-12-20 DIAGNOSIS — M2011 Hallux valgus (acquired), right foot: Secondary | ICD-10-CM | POA: Insufficient documentation

## 2016-12-20 DIAGNOSIS — R609 Edema, unspecified: Secondary | ICD-10-CM | POA: Diagnosis not present

## 2016-12-20 DIAGNOSIS — R600 Localized edema: Secondary | ICD-10-CM | POA: Diagnosis not present

## 2016-12-23 ENCOUNTER — Other Ambulatory Visit: Payer: Self-pay | Admitting: Cardiovascular Disease

## 2016-12-23 ENCOUNTER — Ambulatory Visit (INDEPENDENT_AMBULATORY_CARE_PROVIDER_SITE_OTHER): Payer: Commercial Managed Care - HMO | Admitting: Pharmacist Clinician (PhC)/ Clinical Pharmacy Specialist

## 2016-12-23 DIAGNOSIS — I482 Chronic atrial fibrillation, unspecified: Secondary | ICD-10-CM

## 2016-12-23 DIAGNOSIS — Z7901 Long term (current) use of anticoagulants: Secondary | ICD-10-CM

## 2016-12-23 DIAGNOSIS — I4891 Unspecified atrial fibrillation: Secondary | ICD-10-CM | POA: Diagnosis not present

## 2016-12-23 LAB — PT WITH INR/FINGERSTICK
INR FINGERSTICK: 2.6 — AB (ref 0.9–1.1)
PT, fingerstick: 31.6 seconds — ABNORMAL HIGH (ref 10.5–13.1)

## 2017-01-03 ENCOUNTER — Other Ambulatory Visit: Payer: Self-pay | Admitting: Cardiovascular Disease

## 2017-01-03 ENCOUNTER — Telehealth: Payer: Self-pay | Admitting: Cardiology

## 2017-01-03 ENCOUNTER — Ambulatory Visit (INDEPENDENT_AMBULATORY_CARE_PROVIDER_SITE_OTHER): Payer: Medicare HMO | Admitting: *Deleted

## 2017-01-03 DIAGNOSIS — R001 Bradycardia, unspecified: Secondary | ICD-10-CM

## 2017-01-03 NOTE — Telephone Encounter (Signed)
Attempted to confirm remote transmission with pt. No answer and was unable to leave a message.   

## 2017-01-03 NOTE — Progress Notes (Signed)
Remote pacemaker transmission.   

## 2017-01-04 LAB — CUP PACEART REMOTE DEVICE CHECK
Battery Impedance: 198 Ohm
Battery Remaining Longevity: 135 mo
Date Time Interrogation Session: 20180206174119
Implantable Lead Implant Date: 20071023
Implantable Lead Implant Date: 20071023
Implantable Lead Location: 753859
Implantable Lead Model: 4092
Lead Channel Setting Sensing Sensitivity: 5.6 mV
MDC IDC LEAD LOCATION: 753860
MDC IDC MSMT BATTERY VOLTAGE: 2.79 V
MDC IDC MSMT LEADCHNL RA IMPEDANCE VALUE: 67 Ohm
MDC IDC MSMT LEADCHNL RV IMPEDANCE VALUE: 790 Ohm
MDC IDC MSMT LEADCHNL RV PACING THRESHOLD AMPLITUDE: 0.625 V
MDC IDC MSMT LEADCHNL RV PACING THRESHOLD PULSEWIDTH: 0.4 ms
MDC IDC PG IMPLANT DT: 20140808
MDC IDC SET LEADCHNL RV PACING AMPLITUDE: 2 V
MDC IDC SET LEADCHNL RV PACING PULSEWIDTH: 0.4 ms
MDC IDC STAT BRADY RV PERCENT PACED: 29 %

## 2017-01-06 ENCOUNTER — Encounter: Payer: Self-pay | Admitting: Cardiology

## 2017-01-13 ENCOUNTER — Ambulatory Visit (INDEPENDENT_AMBULATORY_CARE_PROVIDER_SITE_OTHER): Payer: Medicare HMO | Admitting: Urology

## 2017-01-13 DIAGNOSIS — N3020 Other chronic cystitis without hematuria: Secondary | ICD-10-CM

## 2017-01-13 DIAGNOSIS — R310 Gross hematuria: Secondary | ICD-10-CM

## 2017-01-13 DIAGNOSIS — N3941 Urge incontinence: Secondary | ICD-10-CM

## 2017-01-20 ENCOUNTER — Encounter: Payer: Self-pay | Admitting: Cardiology

## 2017-01-20 ENCOUNTER — Other Ambulatory Visit (HOSPITAL_COMMUNITY)
Admission: AD | Admit: 2017-01-20 | Discharge: 2017-01-20 | Disposition: A | Discharge status: Home / Self Care | Payer: Medicare HMO | Source: Other Acute Inpatient Hospital | Attending: Urology | Admitting: Urology

## 2017-01-20 DIAGNOSIS — N3020 Other chronic cystitis without hematuria: Secondary | ICD-10-CM | POA: Insufficient documentation

## 2017-01-22 LAB — URINE CULTURE: Culture: <10000 — AB

## 2017-01-25 ENCOUNTER — Other Ambulatory Visit: Payer: Self-pay | Admitting: Cardiovascular Disease

## 2017-01-25 DIAGNOSIS — I4891 Unspecified atrial fibrillation: Secondary | ICD-10-CM | POA: Diagnosis not present

## 2017-01-25 DIAGNOSIS — Z7901 Long term (current) use of anticoagulants: Secondary | ICD-10-CM | POA: Diagnosis not present

## 2017-01-25 LAB — PT WITH INR/FINGERSTICK
INR, fingerstick: 3.2 — ABNORMAL HIGH (ref 0.9–1.1)
PT FINGERSTICK: 38.1 s — AB (ref 10.5–13.1)

## 2017-01-26 ENCOUNTER — Ambulatory Visit: Payer: Self-pay | Admitting: Pharmacist Clinician (PhC)/ Clinical Pharmacy Specialist

## 2017-01-26 DIAGNOSIS — Z7901 Long term (current) use of anticoagulants: Secondary | ICD-10-CM

## 2017-01-26 DIAGNOSIS — I482 Chronic atrial fibrillation, unspecified: Secondary | ICD-10-CM

## 2017-01-27 DIAGNOSIS — G301 Alzheimer's disease with late onset: Secondary | ICD-10-CM | POA: Diagnosis not present

## 2017-01-27 DIAGNOSIS — I480 Paroxysmal atrial fibrillation: Secondary | ICD-10-CM | POA: Diagnosis not present

## 2017-01-27 DIAGNOSIS — I5022 Chronic systolic (congestive) heart failure: Secondary | ICD-10-CM | POA: Diagnosis not present

## 2017-01-27 DIAGNOSIS — I10 Essential (primary) hypertension: Secondary | ICD-10-CM | POA: Diagnosis not present

## 2017-02-01 ENCOUNTER — Other Ambulatory Visit: Payer: Self-pay | Admitting: Cardiovascular Disease

## 2017-02-17 ENCOUNTER — Other Ambulatory Visit: Payer: Self-pay | Admitting: Cardiovascular Disease

## 2017-02-17 ENCOUNTER — Ambulatory Visit (INDEPENDENT_AMBULATORY_CARE_PROVIDER_SITE_OTHER): Payer: Medicare HMO | Admitting: Pharmacist Clinician (PhC)/ Clinical Pharmacy Specialist

## 2017-02-17 DIAGNOSIS — Z7901 Long term (current) use of anticoagulants: Secondary | ICD-10-CM | POA: Diagnosis not present

## 2017-02-17 DIAGNOSIS — I482 Chronic atrial fibrillation, unspecified: Secondary | ICD-10-CM

## 2017-02-17 DIAGNOSIS — I4891 Unspecified atrial fibrillation: Secondary | ICD-10-CM | POA: Diagnosis not present

## 2017-02-17 LAB — PT WITH INR/FINGERSTICK
INR, fingerstick: 2.7 — ABNORMAL HIGH (ref 0.9–1.1)
PT FINGERSTICK: 33 s — AB (ref 10.5–13.1)

## 2017-02-28 ENCOUNTER — Telehealth: Payer: Self-pay

## 2017-02-28 NOTE — Telephone Encounter (Signed)
Faxed on 02/17/17.

## 2017-02-28 NOTE — Telephone Encounter (Signed)
Faxed signed standing order for PT INRs to [facility].

## 2017-03-14 DIAGNOSIS — H5213 Myopia, bilateral: Secondary | ICD-10-CM | POA: Diagnosis not present

## 2017-03-14 DIAGNOSIS — H353131 Nonexudative age-related macular degeneration, bilateral, early dry stage: Secondary | ICD-10-CM | POA: Diagnosis not present

## 2017-03-14 DIAGNOSIS — H52203 Unspecified astigmatism, bilateral: Secondary | ICD-10-CM | POA: Diagnosis not present

## 2017-03-14 DIAGNOSIS — H524 Presbyopia: Secondary | ICD-10-CM | POA: Diagnosis not present

## 2017-03-14 DIAGNOSIS — H401131 Primary open-angle glaucoma, bilateral, mild stage: Secondary | ICD-10-CM | POA: Diagnosis not present

## 2017-03-14 DIAGNOSIS — Z961 Presence of intraocular lens: Secondary | ICD-10-CM | POA: Diagnosis not present

## 2017-03-24 ENCOUNTER — Ambulatory Visit (INDEPENDENT_AMBULATORY_CARE_PROVIDER_SITE_OTHER): Payer: Medicare HMO | Admitting: Pharmacist

## 2017-03-24 ENCOUNTER — Other Ambulatory Visit: Payer: Self-pay | Admitting: Cardiovascular Disease

## 2017-03-24 DIAGNOSIS — I4891 Unspecified atrial fibrillation: Secondary | ICD-10-CM | POA: Diagnosis not present

## 2017-03-24 DIAGNOSIS — I482 Chronic atrial fibrillation, unspecified: Secondary | ICD-10-CM

## 2017-03-24 DIAGNOSIS — Z7901 Long term (current) use of anticoagulants: Secondary | ICD-10-CM | POA: Diagnosis not present

## 2017-03-24 LAB — PT WITH INR/FINGERSTICK
INR FINGERSTICK: 2.3 — AB (ref 0.9–1.1)
PT, fingerstick: 27.2 seconds — ABNORMAL HIGH (ref 10.5–13.1)

## 2017-03-24 LAB — PROTIME-INR: INR: 2.3 — AB (ref <=1.1)

## 2017-04-04 ENCOUNTER — Ambulatory Visit (INDEPENDENT_AMBULATORY_CARE_PROVIDER_SITE_OTHER): Payer: Medicare HMO | Admitting: *Deleted

## 2017-04-04 DIAGNOSIS — I482 Chronic atrial fibrillation, unspecified: Secondary | ICD-10-CM

## 2017-04-04 NOTE — Progress Notes (Signed)
Remote pacemaker transmission.   

## 2017-04-06 LAB — CUP PACEART REMOTE DEVICE CHECK
Battery Remaining Longevity: 131 mo
Brady Statistic RV Percent Paced: 30 %
Date Time Interrogation Session: 20180508164736
Implantable Lead Implant Date: 20071023
Implantable Lead Location: 753860
Implantable Lead Model: 4092
Implantable Lead Model: 5594
Lead Channel Pacing Threshold Amplitude: 0.5 V
Lead Channel Pacing Threshold Pulse Width: 0.4 ms
Lead Channel Sensing Intrinsic Amplitude: 11.2 mV
Lead Channel Setting Pacing Amplitude: 2 V
MDC IDC LEAD IMPLANT DT: 20071023
MDC IDC LEAD LOCATION: 753859
MDC IDC MSMT BATTERY IMPEDANCE: 223 Ohm
MDC IDC MSMT BATTERY VOLTAGE: 2.79 V
MDC IDC MSMT LEADCHNL RA IMPEDANCE VALUE: 67 Ohm
MDC IDC MSMT LEADCHNL RV IMPEDANCE VALUE: 748 Ohm
MDC IDC PG IMPLANT DT: 20140808
MDC IDC SET LEADCHNL RV PACING PULSEWIDTH: 0.4 ms
MDC IDC SET LEADCHNL RV SENSING SENSITIVITY: 4 mV

## 2017-04-19 ENCOUNTER — Ambulatory Visit (INDEPENDENT_AMBULATORY_CARE_PROVIDER_SITE_OTHER): Payer: Medicare HMO | Admitting: Pharmacist

## 2017-04-19 ENCOUNTER — Other Ambulatory Visit: Payer: Self-pay | Admitting: Cardiovascular Disease

## 2017-04-19 DIAGNOSIS — I482 Chronic atrial fibrillation, unspecified: Secondary | ICD-10-CM

## 2017-04-19 DIAGNOSIS — I4891 Unspecified atrial fibrillation: Secondary | ICD-10-CM | POA: Diagnosis not present

## 2017-04-19 DIAGNOSIS — Z7901 Long term (current) use of anticoagulants: Secondary | ICD-10-CM

## 2017-04-19 LAB — PT WITH INR/FINGERSTICK
INR, fingerstick: 2.2 — ABNORMAL HIGH (ref 0.9–1.1)
PT, fingerstick: 26 seconds — ABNORMAL HIGH (ref 10.5–13.1)

## 2017-04-19 LAB — PROTIME-INR: INR: 2.2 — AB (ref <=1.1)

## 2017-05-05 DIAGNOSIS — M810 Age-related osteoporosis without current pathological fracture: Secondary | ICD-10-CM | POA: Diagnosis not present

## 2017-05-05 DIAGNOSIS — I480 Paroxysmal atrial fibrillation: Secondary | ICD-10-CM | POA: Diagnosis not present

## 2017-05-05 DIAGNOSIS — G301 Alzheimer's disease with late onset: Secondary | ICD-10-CM | POA: Diagnosis not present

## 2017-05-05 DIAGNOSIS — I5022 Chronic systolic (congestive) heart failure: Secondary | ICD-10-CM | POA: Diagnosis not present

## 2017-05-16 ENCOUNTER — Other Ambulatory Visit: Payer: Self-pay | Admitting: Cardiovascular Disease

## 2017-05-16 DIAGNOSIS — Z7901 Long term (current) use of anticoagulants: Secondary | ICD-10-CM | POA: Diagnosis not present

## 2017-05-16 DIAGNOSIS — I4891 Unspecified atrial fibrillation: Secondary | ICD-10-CM | POA: Diagnosis not present

## 2017-05-16 LAB — PT WITH INR/FINGERSTICK
INR, fingerstick: 2.3 — ABNORMAL HIGH (ref 0.9–1.1)
PT, fingerstick: 27.9 seconds — ABNORMAL HIGH (ref 10.5–13.1)

## 2017-06-13 ENCOUNTER — Ambulatory Visit (INDEPENDENT_AMBULATORY_CARE_PROVIDER_SITE_OTHER): Payer: Medicare HMO | Admitting: Pharmacist

## 2017-06-13 ENCOUNTER — Other Ambulatory Visit: Payer: Self-pay | Admitting: Cardiovascular Disease

## 2017-06-13 DIAGNOSIS — Z7901 Long term (current) use of anticoagulants: Secondary | ICD-10-CM | POA: Diagnosis not present

## 2017-06-13 DIAGNOSIS — I482 Chronic atrial fibrillation, unspecified: Secondary | ICD-10-CM

## 2017-06-13 DIAGNOSIS — H401131 Primary open-angle glaucoma, bilateral, mild stage: Secondary | ICD-10-CM | POA: Diagnosis not present

## 2017-06-13 DIAGNOSIS — I4891 Unspecified atrial fibrillation: Secondary | ICD-10-CM | POA: Diagnosis not present

## 2017-06-13 LAB — PT WITH INR/FINGERSTICK
INR FINGERSTICK: 2.3 — AB (ref 0.9–1.1)
PT FINGERSTICK: 27.2 s — AB (ref 10.5–13.1)

## 2017-06-13 LAB — PROTIME-INR: INR: 2.3 — AB (ref <=1.1)

## 2017-07-04 ENCOUNTER — Ambulatory Visit (INDEPENDENT_AMBULATORY_CARE_PROVIDER_SITE_OTHER): Payer: Medicare HMO | Admitting: *Deleted

## 2017-07-04 ENCOUNTER — Telehealth: Payer: Self-pay | Admitting: Cardiology

## 2017-07-04 DIAGNOSIS — R001 Bradycardia, unspecified: Secondary | ICD-10-CM | POA: Diagnosis not present

## 2017-07-04 NOTE — Telephone Encounter (Signed)
Confirmed remote transmission w/ pt daughter.   

## 2017-07-05 ENCOUNTER — Encounter: Payer: Self-pay | Admitting: Cardiology

## 2017-07-05 NOTE — Progress Notes (Signed)
Remote pacemaker transmission.   

## 2017-07-12 ENCOUNTER — Emergency Department (HOSPITAL_COMMUNITY)
Admission: EM | Admit: 2017-07-12 | Discharge: 2017-07-12 | Disposition: A | Discharge status: Home / Self Care | Payer: Medicare HMO | Attending: Emergency Medicine | Admitting: Emergency Medicine

## 2017-07-12 ENCOUNTER — Encounter (HOSPITAL_COMMUNITY): Payer: Self-pay

## 2017-07-12 ENCOUNTER — Emergency Department (HOSPITAL_COMMUNITY): Payer: Medicare HMO

## 2017-07-12 DIAGNOSIS — I4891 Unspecified atrial fibrillation: Secondary | ICD-10-CM | POA: Diagnosis not present

## 2017-07-12 DIAGNOSIS — Z950 Presence of cardiac pacemaker: Secondary | ICD-10-CM | POA: Diagnosis not present

## 2017-07-12 DIAGNOSIS — I4821 Permanent atrial fibrillation: Secondary | ICD-10-CM

## 2017-07-12 DIAGNOSIS — I10 Essential (primary) hypertension: Secondary | ICD-10-CM | POA: Insufficient documentation

## 2017-07-12 DIAGNOSIS — I482 Chronic atrial fibrillation: Secondary | ICD-10-CM | POA: Diagnosis not present

## 2017-07-12 DIAGNOSIS — Z79899 Other long term (current) drug therapy: Secondary | ICD-10-CM | POA: Insufficient documentation

## 2017-07-12 DIAGNOSIS — R531 Weakness: Secondary | ICD-10-CM | POA: Insufficient documentation

## 2017-07-12 DIAGNOSIS — R4182 Altered mental status, unspecified: Secondary | ICD-10-CM | POA: Diagnosis not present

## 2017-07-12 DIAGNOSIS — R404 Transient alteration of awareness: Secondary | ICD-10-CM | POA: Diagnosis not present

## 2017-07-12 DIAGNOSIS — E860 Dehydration: Secondary | ICD-10-CM

## 2017-07-12 DIAGNOSIS — Z7901 Long term (current) use of anticoagulants: Secondary | ICD-10-CM | POA: Diagnosis not present

## 2017-07-12 LAB — CBC WITH DIFFERENTIAL/PLATELET
BASOS ABS: 0 10*3/uL (ref 0.0–0.1)
BASOS PCT: 1 %
Eosinophils Absolute: 0.2 10*3/uL (ref 0.0–0.7)
Eosinophils Relative: 3 %
HEMATOCRIT: 42.9 % (ref 36.0–46.0)
HEMOGLOBIN: 13.9 g/dL (ref 12.0–15.0)
Lymphocytes Relative: 28 %
Lymphs Abs: 1.6 10*3/uL (ref 0.7–4.0)
MCH: 30.8 pg (ref 26.0–34.0)
MCHC: 32.4 g/dL (ref 30.0–36.0)
MCV: 94.9 fL (ref 78.0–100.0)
MONOS PCT: 12 %
Monocytes Absolute: 0.7 10*3/uL (ref 0.1–1.0)
NEUTROS ABS: 3.2 10*3/uL (ref 1.7–7.7)
NEUTROS PCT: 56 %
Platelets: 173 10*3/uL (ref 150–400)
RBC: 4.52 MIL/uL (ref 3.87–5.11)
RDW: 14.7 % (ref 11.5–15.5)
WBC: 5.6 10*3/uL (ref 4.0–10.5)

## 2017-07-12 LAB — URINALYSIS, ROUTINE W REFLEX MICROSCOPIC
BILIRUBIN URINE: NEGATIVE
Bacteria, UA: NONE SEEN
Glucose, UA: NEGATIVE mg/dL
KETONES UR: NEGATIVE mg/dL
LEUKOCYTES UA: NEGATIVE
Nitrite: NEGATIVE
PROTEIN: NEGATIVE mg/dL
Specific Gravity, Urine: 1.011 (ref 1.005–1.030)
pH: 6 (ref 5.0–8.0)

## 2017-07-12 LAB — COMPREHENSIVE METABOLIC PANEL
ALK PHOS: 63 U/L (ref 38–126)
ALT: 15 U/L (ref 14–54)
ANION GAP: 7 (ref 5–15)
AST: 26 U/L (ref 15–41)
Albumin: 3.6 g/dL (ref 3.5–5.0)
BUN: 25 mg/dL — ABNORMAL HIGH (ref 6–20)
CALCIUM: 9 mg/dL (ref 8.9–10.3)
CO2: 27 mmol/L (ref 22–32)
Chloride: 104 mmol/L (ref 101–111)
Creatinine, Ser: 1.02 mg/dL — ABNORMAL HIGH (ref 0.44–1.00)
GFR, EST AFRICAN AMERICAN: 55 mL/min — AB (ref >60)
GFR, EST NON AFRICAN AMERICAN: 48 mL/min — AB (ref >60)
Glucose, Bld: 86 mg/dL (ref 65–99)
Potassium: 4 mmol/L (ref 3.5–5.1)
SODIUM: 138 mmol/L (ref 135–145)
TOTAL PROTEIN: 7.2 g/dL (ref 6.5–8.1)
Total Bilirubin: 0.7 mg/dL (ref 0.3–1.2)

## 2017-07-12 LAB — PROTIME-INR
INR: 2.08
Prothrombin Time: 23.7 seconds — ABNORMAL HIGH (ref 11.4–15.2)

## 2017-07-12 LAB — CBG MONITORING, ED: Glucose-Capillary: 87 mg/dL (ref 65–99)

## 2017-07-12 LAB — TROPONIN I: Troponin I: <0.03 ng/mL (ref <0.03)

## 2017-07-12 MED ORDER — SODIUM CHLORIDE 0.9 % IV BOLUS (SEPSIS)
1000.0000 mL | Freq: Once | INTRAVENOUS | Status: AC
  Administered 2017-07-12: 1000 mL via INTRAVENOUS

## 2017-07-12 MED ORDER — SODIUM CHLORIDE 0.9 % IV SOLN: INTRAVENOUS | Status: DC

## 2017-07-12 NOTE — ED Notes (Signed)
According to previous note pt was discharged at 18:49

## 2017-07-12 NOTE — ED Triage Notes (Signed)
EMS reports that care giver had difficulty waking her this afternoon, but when EMS arrived pt was alert, but groggy. CBG 150. BP 122/70

## 2017-07-12 NOTE — ED Provider Notes (Signed)
[facility]-EMERGENCY DEPT Provider Note   CSN: [id number] Arrival date & time: 07/12/17  1416     History   Chief Complaint Chief Complaint  Patient presents with  . Weakness    HPI [name] is a 83 y.o. female.  Pt presents to the ED today via EMS because of weakness.  The pt was taking a nap and the caregiver had trouble waking up pt.  The pt is awake and denies any pain.  No hx of f/c.  Pt has a hx of a.fib and is on coumadin.  CHA2DS2/VAS Stroke Risk Points      3 >= 2 Points: High Risk  1 - 1.99 Points: Medium Risk  0 Points: Low Risk    This is the only CHA2DS2/VAS Stroke Risk Points available for the past  year.:  Change: N/A         Details    Note: External data might be a factor in metrics not marked with    Points Metrics   This score determines the patient's risk of having a stroke if the  patient has atrial fibrillation.       0 Has Congestive Heart Failure:  No   0 Has Vascular Disease:  No   0 Has Hypertension:  No   2 Age:  14   0 Has Diabetes:  No   0 Had Stroke:  No Had TIA:  No Had thromboembolism:  No   1 Female:  Yes             Past Medical History:  Diagnosis Date  . Acid reflux   . Atrial fibrillation (HCC)   . Atrophic vaginitis   . Chronic cystitis   . Decubitus ulcer of buttock   . Dementia   . Hyperlipidemia   . Hypertension   . Incomplete bladder emptying   . Microscopic hematuria   . Pacemaker   . Straining on urination   . Symptomatic bradycardia   . Urge incontinence of urine   . UTI (lower urinary tract infection)     Patient Active Problem List   Diagnosis Date Noted  . Hyperlipidemia 11/14/2013  . Mitral insufficiency 11/14/2013  . Symptomatic bradycardia 07/05/2013  . Pacemaker - Medtronic 2007, new generator 2014 07/05/2013  . Atrial fibrillation (HCC) 03/15/2013  . Long term current use of anticoagulant therapy 03/15/2013    Past Surgical History:  Procedure Laterality Date  . CARDIAC CATHETERIZATION   04/2004  . PACEMAKER GENERATOR CHANGE N/A 07/05/2013   Procedure: PACEMAKER GENERATOR CHANGE;  Surgeon: Thurmon Fair, MD;  Location: [facility];  Service: Cardiovascular;  Laterality: N/A;  . PACEMAKER INSERTION      OB History    No data available       Home Medications    Prior to Admission medications   Medication Sig Start Date End Date Taking? Authorizing Provider  cephALEXin (KEFLEX) 250 MG capsule Take 1 capsule by mouth daily. 08/04/16  Yes [provider]  furosemide (LASIX) 40 MG tablet Take 40 mg by mouth daily.  09/25/14  Yes [provider]  metoprolol succinate (TOPROL-XL) 25 MG 24 hr tablet Take 1.5 tablets (37.5 mg total) by mouth daily. 10/10/16  Yes Croitoru, Mihai, MD  Multiple Vitamins-Minerals (CENTRUM SILVER PO) Take 1 tablet by mouth daily.     Yes [provider]  omeprazole (PRILOSEC) 20 MG capsule Take 20 mg by mouth daily.     Yes [provider]  potassium chloride SA (  K-DUR,KLOR-CON) 20 MEQ tablet Take 1 tablet by mouth daily. 09/24/14  Yes [provider]  simvastatin (ZOCOR) 10 MG tablet Take 10 mg by mouth daily.   Yes [provider]  Travoprost, BAK Free, (TRAVATAN Z) 0.004 % SOLN ophthalmic solution Place 1 drop into both eyes at bedtime. 06/13/17  Yes [provider]  warfarin (COUMADIN) 3 MG tablet TAKE 1 TO 1 AND 1/2 TABLET BY MOUTH DAILY AS DIRECTED. 02/01/17  Yes Croitoru, Mihai, MD    Family History Family History  Problem Relation Age of Onset  . Cancer Other     Social History Social History  Substance Use Topics  . Smoking status: Never Smoker  . Smokeless tobacco: Never Used  . Alcohol use No     Allergies   Contrast media [iodinated diagnostic agents]; Penicillins; and Sulfa antibiotics   Review of Systems Review of Systems  Neurological: Positive for weakness.  All other systems reviewed and are negative.    Physical Exam Updated Vital Signs BP (!) 142/64    Pulse 82   Temp 98.1 F (36.7 C) (Rectal)   Resp (!) 25   Wt 73.9 kg (163 lb)   SpO2 96%   BMI 28.87 kg/m   Physical Exam  Constitutional: She is oriented to person, place, and time. She appears well-developed and well-nourished.  HENT:  Head: Normocephalic and atraumatic.  Right Ear: External ear normal.  Left Ear: External ear normal.  Nose: Nose normal.  Mouth/Throat: Oropharynx is clear and moist.  Eyes: Pupils are equal, round, and reactive to light. Conjunctivae and EOM are normal.  Neck: Normal range of motion. Neck supple.  Cardiovascular: Normal rate, normal heart sounds and intact distal pulses.  An irregularly irregular rhythm present.  Pulmonary/Chest: Effort normal and breath sounds normal.  Abdominal: Soft. Bowel sounds are normal.  Musculoskeletal: Normal range of motion.  Neurological: She is alert and oriented to person, place, and time.  Skin: Skin is warm. Capillary refill takes less than 2 seconds.  Psychiatric: She has a normal mood and affect. Her behavior is normal. Judgment and thought content normal.  Nursing note and vitals reviewed.    ED Treatments / Results  Labs (all labs ordered are listed, but only abnormal results are displayed) Labs Reviewed  COMPREHENSIVE METABOLIC PANEL - Abnormal; Notable for the following:       Result Value   BUN 25 (*)    Creatinine, Ser 1.02 (*)    GFR calc non Af Amer 48 (*)    GFR calc Af Amer 55 (*)    All other components within normal limits  URINALYSIS, ROUTINE W REFLEX MICROSCOPIC - Abnormal; Notable for the following:    Hgb urine dipstick SMALL (*)    Squamous Epithelial / LPF 0-5 (*)    All other components within normal limits  PROTIME-INR - Abnormal; Notable for the following:    Prothrombin Time 23.7 (*)    All other components within normal limits  CBC WITH DIFFERENTIAL/PLATELET  TROPONIN I  CBG MONITORING, ED    EKG  EKG Interpretation  Date/Time:  Wednesday July 12 2017 14:21:19  EDT Ventricular Rate:  75 PR Interval:    QRS Duration: 174 QT Interval:  468 QTC Calculation: 480 R Axis:   -78 Text Interpretation:  Atrial fibrillation RBBB and LAFB LVH with secondary repolarization abnormality No significant change since last tracing Confirmed by Jacalyn Lefevre [phone]) on 07/12/2017 3:12:34 PM       Radiology Dg Chest  1 View  Result Date: 07/12/2017 CLINICAL DATA:  Altered mental status, weakness, possible dehydration. EXAM: CHEST 1 VIEW COMPARISON:  10/05/2015. FINDINGS: Trachea is midline. Heart is enlarged, stable. Thoracic aorta is calcified. Lungs are low in volume with pleuroparenchymal scarring at the base of the left hemithorax. Difficult to exclude superimposed pneumonia. No definite pleural fluid. Pacemaker lead tips are in the right atrium and right ventricle. IMPRESSION: 1. Pleuroparenchymal scarring at the base of the left hemithorax. Difficult to exclude superimposed pneumonia. 2.  Aortic atherosclerosis (ICD10-170.0). Electronically Signed   By: Leanna BattlesMelinda  Blietz M.D.   On: 07/12/2017 15:11   Ct Head Wo Contrast  Result Date: 07/12/2017 CLINICAL DATA:  Mental status changes. EXAM: CT HEAD WITHOUT CONTRAST TECHNIQUE: Contiguous axial images were obtained from the base of the skull through the vertex without intravenous contrast. COMPARISON:  10/05/2015 FINDINGS: Brain: No evidence of acute infarction, hemorrhage, hydrocephalus, extra-axial collection or mass lesion/mass effect. There is prominence of the sulci and ventricles compatible with brain atrophy. Low attenuation within the subcortical and periventricular white matter is identified compatible with chronic small vessel ischemic change. Vascular: No hyperdense vessel or unexpected calcification. Skull: Normal. Negative for fracture or focal lesion. Sinuses/Orbits: Chronic sinus disease involving the left maxillary sinus noted. Other: None. IMPRESSION: 1. No acute intracranial abnormality. 2. Atrophy and  chronic microvascular disease. Electronically Signed   By: Signa Kellaylor  Stroud M.D.   On: 07/12/2017 15:06    Procedures Procedures (including critical care time)  Medications Ordered in ED Medications  sodium chloride 0.9 % bolus 1,000 mL (0 mLs Intravenous Stopped 07/12/17 1628)    And  0.9 %  sodium chloride infusion (not administered)     Initial Impression / Assessment and Plan / ED Course  I have reviewed the triage vital signs and the nursing notes.  Pertinent labs & imaging results that were available during my care of the patient were reviewed by me and considered in my medical decision making (see chart for details).  Pt has no clinical sx of pna.  She is saturating well and has not had a fever or a cough.   She has been awake and alert while here.  She is able to ambulate with her walker.  She has been able to eat and drink.  Caregiver knows to return if worse.    Final Clinical Impressions(s) / ED Diagnoses   Final diagnoses:  Weakness  Dehydration  Permanent atrial fibrillation [facility](HCC)    New Prescriptions New Prescriptions   No medications on file     Jacalyn LefevreHaviland, Birdie Fetty, MD 07/12/17 [phone]

## 2017-07-21 LAB — CUP PACEART REMOTE DEVICE CHECK
Battery Remaining Longevity: 127 mo
Battery Voltage: 2.79 V
Brady Statistic RV Percent Paced: 31 %
Implantable Lead Implant Date: 20071023
Implantable Lead Implant Date: 20071023
Implantable Lead Location: 753860
Implantable Lead Model: 4092
Implantable Pulse Generator Implant Date: 20140808
Lead Channel Pacing Threshold Amplitude: 0.625 V
Lead Channel Setting Pacing Pulse Width: 0.4 ms
Lead Channel Setting Sensing Sensitivity: 5.6 mV
MDC IDC LEAD LOCATION: 753859
MDC IDC MSMT BATTERY IMPEDANCE: 247 Ohm
MDC IDC MSMT LEADCHNL RA IMPEDANCE VALUE: 67 Ohm
MDC IDC MSMT LEADCHNL RV IMPEDANCE VALUE: 767 Ohm
MDC IDC MSMT LEADCHNL RV PACING THRESHOLD PULSEWIDTH: 0.4 ms
MDC IDC SESS DTM: 20180807155442
MDC IDC SET LEADCHNL RV PACING AMPLITUDE: 2 V

## 2017-07-24 ENCOUNTER — Encounter: Payer: Self-pay | Admitting: Cardiology

## 2017-07-25 ENCOUNTER — Ambulatory Visit (INDEPENDENT_AMBULATORY_CARE_PROVIDER_SITE_OTHER): Payer: Medicare HMO | Admitting: Pharmacist Clinician (PhC)/ Clinical Pharmacy Specialist

## 2017-07-25 ENCOUNTER — Telehealth: Payer: Self-pay | Admitting: Cardiovascular Disease

## 2017-07-25 DIAGNOSIS — I482 Chronic atrial fibrillation, unspecified: Secondary | ICD-10-CM

## 2017-07-25 DIAGNOSIS — Z7901 Long term (current) use of anticoagulants: Secondary | ICD-10-CM

## 2017-07-25 NOTE — Telephone Encounter (Signed)
[name]( Daughter)  is calling to give you Mrs. Nile PT/INR and it is 23.7 which was done on 07/14/17 at [facility] . Please call if you have any questions

## 2017-07-25 NOTE — Telephone Encounter (Signed)
See anticoag note

## 2017-07-27 ENCOUNTER — Telehealth: Payer: Self-pay | Admitting: Pharmacist Clinician (PhC)/ Clinical Pharmacy Specialist

## 2017-07-27 NOTE — Telephone Encounter (Signed)
Coumadin letter 

## 2017-08-04 DIAGNOSIS — I5022 Chronic systolic (congestive) heart failure: Secondary | ICD-10-CM | POA: Diagnosis not present

## 2017-08-04 DIAGNOSIS — I10 Essential (primary) hypertension: Secondary | ICD-10-CM | POA: Diagnosis not present

## 2017-08-04 DIAGNOSIS — I480 Paroxysmal atrial fibrillation: Secondary | ICD-10-CM | POA: Diagnosis not present

## 2017-08-04 DIAGNOSIS — G301 Alzheimer's disease with late onset: Secondary | ICD-10-CM | POA: Diagnosis not present

## 2017-08-15 ENCOUNTER — Ambulatory Visit (INDEPENDENT_AMBULATORY_CARE_PROVIDER_SITE_OTHER): Payer: Medicare HMO | Admitting: Pharmacist Clinician (PhC)/ Clinical Pharmacy Specialist

## 2017-08-15 ENCOUNTER — Other Ambulatory Visit: Payer: Self-pay | Admitting: Cardiovascular Disease

## 2017-08-15 DIAGNOSIS — Z7901 Long term (current) use of anticoagulants: Secondary | ICD-10-CM

## 2017-08-15 DIAGNOSIS — I482 Chronic atrial fibrillation, unspecified: Secondary | ICD-10-CM

## 2017-08-15 DIAGNOSIS — I4891 Unspecified atrial fibrillation: Secondary | ICD-10-CM | POA: Diagnosis not present

## 2017-08-15 LAB — PT WITH INR/FINGERSTICK
INR FINGERSTICK: 2.1 ratio — AB
PT, fingerstick: 25.3 s — ABNORMAL HIGH (ref 10.5–13.1)

## 2017-09-12 ENCOUNTER — Other Ambulatory Visit: Payer: Self-pay | Admitting: Cardiovascular Disease

## 2017-09-12 ENCOUNTER — Emergency Department (HOSPITAL_COMMUNITY)
Admission: EM | Admit: 2017-09-12 | Discharge: 2017-09-12 | Disposition: A | Discharge status: Home / Self Care | Payer: Medicare HMO | Attending: Emergency Medicine | Admitting: Emergency Medicine

## 2017-09-12 ENCOUNTER — Encounter (HOSPITAL_COMMUNITY): Payer: Self-pay | Admitting: *Deleted

## 2017-09-12 ENCOUNTER — Emergency Department (HOSPITAL_COMMUNITY): Payer: Medicare HMO

## 2017-09-12 DIAGNOSIS — I10 Essential (primary) hypertension: Secondary | ICD-10-CM | POA: Diagnosis not present

## 2017-09-12 DIAGNOSIS — Z7901 Long term (current) use of anticoagulants: Secondary | ICD-10-CM | POA: Insufficient documentation

## 2017-09-12 DIAGNOSIS — R410 Disorientation, unspecified: Secondary | ICD-10-CM

## 2017-09-12 DIAGNOSIS — R51 Headache: Secondary | ICD-10-CM | POA: Diagnosis not present

## 2017-09-12 DIAGNOSIS — Z950 Presence of cardiac pacemaker: Secondary | ICD-10-CM | POA: Insufficient documentation

## 2017-09-12 DIAGNOSIS — F0390 Unspecified dementia without behavioral disturbance: Secondary | ICD-10-CM | POA: Insufficient documentation

## 2017-09-12 DIAGNOSIS — Z79899 Other long term (current) drug therapy: Secondary | ICD-10-CM | POA: Diagnosis not present

## 2017-09-12 DIAGNOSIS — N390 Urinary tract infection, site not specified: Secondary | ICD-10-CM | POA: Insufficient documentation

## 2017-09-12 DIAGNOSIS — R4182 Altered mental status, unspecified: Secondary | ICD-10-CM | POA: Diagnosis present

## 2017-09-12 LAB — URINALYSIS, ROUTINE W REFLEX MICROSCOPIC
Bilirubin Urine: NEGATIVE
Glucose, UA: NEGATIVE mg/dL
Ketones, ur: NEGATIVE mg/dL
Nitrite: NEGATIVE
Protein, ur: 30 mg/dL — AB
Specific Gravity, Urine: 1.01 (ref 1.005–1.030)
pH: 6 (ref 5.0–8.0)

## 2017-09-12 LAB — CBC WITH DIFFERENTIAL/PLATELET
Basophils Absolute: 0.1 10*3/uL (ref 0.0–0.1)
Basophils Relative: 1 %
Eosinophils Absolute: 0.2 10*3/uL (ref 0.0–0.7)
Eosinophils Relative: 3 %
HCT: 41.5 % (ref 36.0–46.0)
Hemoglobin: 13.2 g/dL (ref 12.0–15.0)
Lymphocytes Relative: 26 %
Lymphs Abs: 1.6 10*3/uL (ref 0.7–4.0)
MCH: 30.1 pg (ref 26.0–34.0)
MCHC: 31.8 g/dL (ref 30.0–36.0)
MCV: 94.5 fL (ref 78.0–100.0)
Monocytes Absolute: 0.7 10*3/uL (ref 0.1–1.0)
Monocytes Relative: 11 %
Neutro Abs: 3.7 10*3/uL (ref 1.7–7.7)
Neutrophils Relative %: 59 %
Platelets: 174 10*3/uL (ref 150–400)
RBC: 4.39 MIL/uL (ref 3.87–5.11)
RDW: 14.4 % (ref 11.5–15.5)
WBC: 6.3 10*3/uL (ref 4.0–10.5)

## 2017-09-12 LAB — PROTIME-INR
INR: 2.42
Prothrombin Time: 26.1 seconds — ABNORMAL HIGH (ref 11.4–15.2)

## 2017-09-12 LAB — BASIC METABOLIC PANEL
Anion gap: 9 (ref 5–15)
BUN: 27 mg/dL — ABNORMAL HIGH (ref 6–20)
CO2: 25 mmol/L (ref 22–32)
Calcium: 9 mg/dL (ref 8.9–10.3)
Chloride: 105 mmol/L (ref 101–111)
Creatinine, Ser: 1.07 mg/dL — ABNORMAL HIGH (ref 0.44–1.00)
GFR calc Af Amer: 52 mL/min — ABNORMAL LOW (ref >60)
GFR calc non Af Amer: 45 mL/min — ABNORMAL LOW (ref >60)
Glucose, Bld: 93 mg/dL (ref 65–99)
Potassium: 4.7 mmol/L (ref 3.5–5.1)
Sodium: 139 mmol/L (ref 135–145)

## 2017-09-12 MED ORDER — CIPROFLOXACIN HCL 250 MG PO TABS
500.0000 mg | ORAL_TABLET | Freq: Once | ORAL | Status: AC
  Administered 2017-09-12: 500 mg via ORAL
  Filled 2017-09-12: qty 2

## 2017-09-12 MED ORDER — CIPROFLOXACIN HCL 250 MG PO TABS: 250.0000 mg | ORAL_TABLET | Freq: Two times a day (BID) | ORAL | 0 refills | Status: DC

## 2017-09-12 NOTE — ED Triage Notes (Signed)
Pt's daughter c/o pt has had increased confused x 1 week. Daughter reports pt has had multiple UTIs and she is on an antibiotic right now but unsure of reason why.

## 2017-09-12 NOTE — ED Notes (Signed)
Pt to CT

## 2017-09-12 NOTE — ED Notes (Signed)
MD at bedside. 

## 2017-09-13 NOTE — ED Provider Notes (Signed)
[facility] EMERGENCY DEPARTMENT Provider Note   CSN: [id number] Arrival date & time: 09/12/17  1301     History   Chief Complaint Chief Complaint  Patient presents with  . Altered Mental Status    HPI [name] is a 83 y.o. female.  HPI   83 year old female presenting for her daughter for evaluation of change in mental status. His prior to arrival patient was sitting in a chair and kept nodding forward and appears sleepy. Other acute complaints. No reported fever.  Past Medical History:  Diagnosis Date  . Acid reflux   . Atrial fibrillation (HCC)   . Atrophic vaginitis   . Chronic cystitis   . Decubitus ulcer of buttock   . Dementia   . Hyperlipidemia   . Hypertension   . Incomplete bladder emptying   . Microscopic hematuria   . Pacemaker   . Straining on urination   . Symptomatic bradycardia   . Urge incontinence of urine   . UTI (lower urinary tract infection)     Patient Active Problem List   Diagnosis Date Noted  . Hyperlipidemia 11/14/2013  . Mitral insufficiency 11/14/2013  . Symptomatic bradycardia 07/05/2013  . Pacemaker - Medtronic 2007, new generator 2014 07/05/2013  . Atrial fibrillation (HCC) 03/15/2013  . Long term current use of anticoagulant therapy 03/15/2013    Past Surgical History:  Procedure Laterality Date  . CARDIAC CATHETERIZATION  04/2004  . PACEMAKER GENERATOR CHANGE N/A 07/05/2013   Procedure: PACEMAKER GENERATOR CHANGE;  Surgeon: Thurmon Fair, MD;  Location: [facility] CATH LAB;  Service: Cardiovascular;  Laterality: N/A;  . PACEMAKER INSERTION      OB History    No data available       Home Medications    Prior to Admission medications   Medication Sig Start Date End Date Taking? Authorizing Provider  furosemide (LASIX) 40 MG tablet Take 40 mg by mouth daily.  09/25/14  Yes [provider]  metoprolol succinate (TOPROL-XL) 25 MG 24 hr tablet Take 1.5 tablets (37.5 mg total) by mouth daily. 10/10/16  Yes  Croitoru, Mihai, MD  Multiple Vitamins-Minerals (CENTRUM SILVER PO) Take 1 tablet by mouth daily.     Yes [provider]  omeprazole (PRILOSEC) 20 MG capsule Take 20 mg by mouth daily.     Yes [provider]  potassium chloride SA (K-DUR,KLOR-CON) 20 MEQ tablet Take 1 tablet by mouth daily. 09/24/14  Yes [provider]  simvastatin (ZOCOR) 10 MG tablet Take 10 mg by mouth daily.   Yes [provider]  Travoprost, BAK Free, (TRAVATAN Z) 0.004 % SOLN ophthalmic solution Place 1 drop into both eyes at bedtime. 06/13/17  Yes [provider]  warfarin (COUMADIN) 3 MG tablet TAKE 1 TO 1 AND 1/2 TABLET BY MOUTH DAILY AS DIRECTED. 02/01/17  Yes Croitoru, Mihai, MD  cephALEXin (KEFLEX) 250 MG capsule Take 1 capsule by mouth daily. 08/04/16   [provider]  ciprofloxacin (CIPRO) 250 MG tablet Take 1 tablet (250 mg total) by mouth 2 (two) times daily. 09/12/17   Raeford Razor, MD    Family History Family History  Problem Relation Age of Onset  . Cancer Other     Social History Social History  Substance Use Topics  . Smoking status: Never Smoker  . Smokeless tobacco: Never Used  . Alcohol use No     Allergies   Contrast media [iodinated diagnostic agents]; Penicillins; and Sulfa antibiotics   Review of Systems Review of Systems  Level V caveat because of dementia Physical Exam Updated Vital Signs BP (!) 141/84   Pulse (!) 134   Temp (!) 97.5 F (36.4 C) (Oral)   Resp (!) 25   Ht 5\' 3"  (1.6 m)   Wt 74.4 kg (164 lb)   SpO2 97%   BMI 29.05 kg/m   Physical Exam  Constitutional: She appears well-developed and well-nourished. No distress.  HENT:  Head: Normocephalic and atraumatic.  Eyes: Conjunctivae are normal. Right eye exhibits no discharge. Left eye exhibits no discharge.  Neck: Neck supple.  Cardiovascular: Normal rate, regular rhythm and normal heart sounds.  Exam reveals no gallop and no friction rub.   No murmur  heard. Pulmonary/Chest: Effort normal and breath sounds normal. No respiratory distress.  Abdominal: Soft. She exhibits no distension. There is no tenderness.  Musculoskeletal: She exhibits no edema or tenderness.  Patient reports she has pain with palpation of prematurity were on her body. Her daughter reports this is chronic.  Neurological: She is alert.  Disoriented to place and time. Follows simple commands. No focal motor deficits.  Skin: Skin is warm and dry.  Psychiatric: She has a normal mood and affect. Her behavior is normal. Thought content normal.  Nursing note and vitals reviewed.    ED Treatments / Results  Labs (all labs ordered are listed, but only abnormal results are displayed) Labs Reviewed  BASIC METABOLIC PANEL - Abnormal; Notable for the following:       Result Value   BUN 27 (*)    Creatinine, Ser 1.07 (*)    GFR calc non Af Amer 45 (*)    GFR calc Af Amer 52 (*)    All other components within normal limits  URINALYSIS, ROUTINE W REFLEX MICROSCOPIC - Abnormal; Notable for the following:    APPearance HAZY (*)    Hgb urine dipstick MODERATE (*)    Protein, ur 30 (*)    Leukocytes, UA MODERATE (*)    Bacteria, UA FEW (*)    Squamous Epithelial / LPF 0-5 (*)    All other components within normal limits  PROTIME-INR - Abnormal; Notable for the following:    Prothrombin Time 26.1 (*)    All other components within normal limits  URINE CULTURE  CBC WITH DIFFERENTIAL/PLATELET    EKG  EKG Interpretation  Date/Time:  Tuesday September 12 2017 13:55:56 EDT Ventricular Rate:  69 PR Interval:    QRS Duration: 123 QT Interval:  407 QTC Calculation: 420 R Axis:   -9 Text Interpretation:  Ventricular-paced complexes No further rhythm analysis attempted due to paced rhythm Nonspecific T abnormalities, lateral leads Confirmed by Raeford RazorKohut, Breanah Faddis [phone]) on 09/12/2017 2:25:06 PM       Radiology Ct Head Wo Contrast  Result Date: 09/12/2017 CLINICAL DATA:  83  y/o female with headache and unexplained altered mental status today, increased confusion. EXAM: CT HEAD WITHOUT CONTRAST TECHNIQUE: Contiguous axial images were obtained from the base of the skull through the vertex without intravenous contrast. COMPARISON:  Head CTs 07/12/2017 and earlier. FINDINGS: Brain: Stable cerebral volume. Gray-white matter differentiation is normal for age throughout the brain. No midline shift, ventriculomegaly, mass effect, evidence of mass lesion, intracranial hemorrhage or evidence of cortically based acute infarction. No cortical encephalomalacia identified. Vascular: Calcified atherosclerosis at the skull base. Skull: Stable, osteopenia.  No acute osseous abnormality identified. Sinuses/Orbits: Chronic left maxillary sinusitis. The other visible paranasal sinuses and mastoids are stable and well pneumatized. Other: Stable orbit and scalp soft tissues.  IMPRESSION: No acute intracranial abnormality. Continued stable non contrast CT appearance of the brain. Electronically Signed   By: Odessa Fleming M.D.   On: 09/12/2017 15:34    Procedures Procedures (including critical care time)  Medications Ordered in ED Medications  ciprofloxacin (CIPRO) tablet 500 mg (500 mg Oral Given 09/12/17 1546)     Initial Impression / Assessment and Plan / ED Course  I have reviewed the triage vital signs and the nursing notes.  Pertinent labs & imaging results that were available during my care of the patient were reviewed by me and considered in my medical decision making (see chart for details).     83 year old female who sounds like she is essentially nodding off while sitting down. She otherwise appears to be at her baseline. Workup significant for UTI. Patient is appropriate for outpatient treatment.  Final Clinical Impressions(s) / ED Diagnoses   Final diagnoses:  Urinary tract infection without hematuria, site unspecified  Confusion    New Prescriptions Discharge Medication  List as of 09/12/2017  4:04 PM       Raeford Razor, MD 09/13/17 1238

## 2017-09-14 LAB — PT WITH INR/FINGERSTICK

## 2017-09-15 ENCOUNTER — Ambulatory Visit (INDEPENDENT_AMBULATORY_CARE_PROVIDER_SITE_OTHER): Payer: Medicare HMO | Admitting: Pharmacist Clinician (PhC)/ Clinical Pharmacy Specialist

## 2017-09-15 DIAGNOSIS — Z7901 Long term (current) use of anticoagulants: Secondary | ICD-10-CM

## 2017-09-15 DIAGNOSIS — I482 Chronic atrial fibrillation, unspecified: Secondary | ICD-10-CM

## 2017-09-15 LAB — URINE CULTURE: Culture: >=100000 — AB

## 2017-09-16 ENCOUNTER — Telehealth: Payer: Self-pay

## 2017-09-16 NOTE — Progress Notes (Signed)
ED Antimicrobial Stewardship Positive Culture Follow Up   [name] is an 83 y.o. female [facility]  Chief Complaint  Patient presents with  . Altered Mental Status    Recent Results (from the past 720 hour(s))  Urine culture     Status: Abnormal   Collection Time: 09/12/17  1:39 PM  Result Value Ref Range Status   Specimen Description URINE, RANDOM  Final   Special Requests NONE  Final   Culture >=100,000 COLONIES/mL ESCHERICHIA COLI (A)  Final   Report Status 09/15/2017 FINAL  Final   Organism ID, Bacteria ESCHERICHIA COLI (A)  Final      Susceptibility   Escherichia coli - MIC*    AMPICILLIN >=32 RESISTANT Resistant     CEFAZOLIN <=4 SENSITIVE Sensitive     CEFTRIAXONE <=1 SENSITIVE Sensitive     CIPROFLOXACIN >=4 RESISTANT Resistant     GENTAMICIN <=1 SENSITIVE Sensitive     IMIPENEM <=0.25 SENSITIVE Sensitive     NITROFURANTOIN <=16 SENSITIVE Sensitive     TRIMETH/SULFA <=20 SENSITIVE Sensitive     AMPICILLIN/SULBACTAM 8 SENSITIVE Sensitive     PIP/TAZO <=4 SENSITIVE Sensitive     Extended ESBL NEGATIVE Sensitive     * >=100,000 COLONIES/mL ESCHERICHIA COLI    [x]  Treated with ciprofloxacin, organism resistant to prescribed antimicrobial []  Patient discharged originally without antimicrobial agent and treatment is now indicated  New antibiotic prescription: Keflex 500 mg po bid x 7days  ED Provider: A. Tiburcio PeaHarris, PA-C   MastersJill Side, Charnee Turnipseed M 09/16/2017, 9:17 AM

## 2017-09-16 NOTE — Telephone Encounter (Signed)
Post ED Visit - Positive Culture Follow-up: Successful Patient Follow-Up  Culture assessed and recommendations reviewed by: []  Enzo BiNathan Batchelder, Pharm.D. []  Celedonio MiyamotoJeremy Frens, Pharm.D., BCPS AQ-ID []  Garvin FilaMike Maccia, Pharm.D., BCPS []  Georgina PillionElizabeth Martin, Pharm.D., BCPS []  Forest MeadowsMinh Pham, [address] BoulevardPharm.D., BCPS, AAHIVP []  Estella HuskMichelle Turner, Pharm.D., BCPS, AAHIVP []  Lysle Pearlachel Rumbarger, PharmD, BCPS []  Casilda Carlsaylor Stone, PharmD, BCPS []  Pollyann SamplesAndy Johnston, PharmD, BCPS [facility] Pharm D Positive urine culture  []  Patient discharged without antimicrobial prescription and treatment is now indicated [x]  Organism is resistant to prescribed ED discharge antimicrobial []  Patient with positive blood cultures  Changes discussed with ED provider: Arthor CaptainAbigail Harris PA New antibiotic prescription Keflex 500mg  PO BID x 7 days [facility] [phone]  Contacted patient, date 09/16/17, time [phone]   Jerry CarasCullom, Leilah Polimeni Burnett 09/16/2017, 9:36 AM

## 2017-09-21 ENCOUNTER — Other Ambulatory Visit: Payer: Self-pay | Admitting: Cardiovascular Disease

## 2017-09-29 ENCOUNTER — Ambulatory Visit (INDEPENDENT_AMBULATORY_CARE_PROVIDER_SITE_OTHER): Payer: Medicare HMO | Admitting: Urology

## 2017-09-29 DIAGNOSIS — N3941 Urge incontinence: Secondary | ICD-10-CM | POA: Diagnosis not present

## 2017-09-29 DIAGNOSIS — R4182 Altered mental status, unspecified: Secondary | ICD-10-CM | POA: Diagnosis not present

## 2017-09-29 DIAGNOSIS — N3020 Other chronic cystitis without hematuria: Secondary | ICD-10-CM

## 2017-10-03 ENCOUNTER — Ambulatory Visit (INDEPENDENT_AMBULATORY_CARE_PROVIDER_SITE_OTHER): Payer: Medicare HMO | Admitting: *Deleted

## 2017-10-03 DIAGNOSIS — R001 Bradycardia, unspecified: Secondary | ICD-10-CM

## 2017-10-03 NOTE — Progress Notes (Signed)
Remote pacemaker transmission.   

## 2017-10-05 ENCOUNTER — Encounter: Payer: Self-pay | Admitting: Cardiology

## 2017-10-09 LAB — CUP PACEART REMOTE DEVICE CHECK
Battery Remaining Longevity: 120 mo
Battery Voltage: 2.79 V
Implantable Lead Implant Date: 20071023
Implantable Lead Implant Date: 20071023
Implantable Lead Location: 753860
Implantable Lead Model: 4092
Implantable Lead Model: 5594
Implantable Pulse Generator Implant Date: 20140808
Lead Channel Impedance Value: 786 Ohm
Lead Channel Pacing Threshold Amplitude: 0.625 V
Lead Channel Pacing Threshold Pulse Width: 0.4 ms
MDC IDC LEAD LOCATION: 753859
MDC IDC MSMT BATTERY IMPEDANCE: 296 Ohm
MDC IDC MSMT LEADCHNL RA IMPEDANCE VALUE: 67 Ohm
MDC IDC SESS DTM: 20181106173241
MDC IDC SET LEADCHNL RV PACING AMPLITUDE: 2 V
MDC IDC SET LEADCHNL RV PACING PULSEWIDTH: 0.4 ms
MDC IDC SET LEADCHNL RV SENSING SENSITIVITY: 5.6 mV
MDC IDC STAT BRADY RV PERCENT PACED: 32 %

## 2017-10-17 ENCOUNTER — Other Ambulatory Visit: Payer: Self-pay | Admitting: Cardiovascular Disease

## 2017-10-17 DIAGNOSIS — H401131 Primary open-angle glaucoma, bilateral, mild stage: Secondary | ICD-10-CM | POA: Diagnosis not present

## 2017-10-17 DIAGNOSIS — Z7901 Long term (current) use of anticoagulants: Secondary | ICD-10-CM | POA: Diagnosis not present

## 2017-10-18 ENCOUNTER — Ambulatory Visit (INDEPENDENT_AMBULATORY_CARE_PROVIDER_SITE_OTHER): Payer: Medicare HMO | Admitting: Pharmacist

## 2017-10-18 DIAGNOSIS — I482 Chronic atrial fibrillation, unspecified: Secondary | ICD-10-CM

## 2017-10-18 DIAGNOSIS — Z7901 Long term (current) use of anticoagulants: Secondary | ICD-10-CM

## 2017-10-18 LAB — PROTIME-INR
INR: 2 — ABNORMAL HIGH
Prothrombin Time: 21 s — ABNORMAL HIGH (ref 9.0–11.5)

## 2017-11-03 DIAGNOSIS — I5022 Chronic systolic (congestive) heart failure: Secondary | ICD-10-CM | POA: Diagnosis not present

## 2017-11-03 DIAGNOSIS — I10 Essential (primary) hypertension: Secondary | ICD-10-CM | POA: Diagnosis not present

## 2017-11-03 DIAGNOSIS — Z0001 Encounter for general adult medical examination with abnormal findings: Secondary | ICD-10-CM | POA: Diagnosis not present

## 2017-11-03 DIAGNOSIS — Z1389 Encounter for screening for other disorder: Secondary | ICD-10-CM | POA: Diagnosis not present

## 2017-11-03 DIAGNOSIS — G301 Alzheimer's disease with late onset: Secondary | ICD-10-CM | POA: Diagnosis not present

## 2017-11-08 ENCOUNTER — Encounter: Payer: Medicare HMO | Admitting: Cardiovascular Disease

## 2017-11-08 NOTE — Progress Notes (Deleted)
Cardiology Office Note    Date:  11/08/2017   ID:  Norma Picketthelma D Linan, DOB 05-Jun-1927, MRN [id number]  PCP:  Avon GullyFanta, Tesfaye, MD  Cardiologist:   Thurmon FairMihai Blaike Newburn, MD   No chief complaint on file.   History of Present Illness:  [name] is a 83 y.o. female with long-standing permanent atrial fibrillation with slow ventricular response and a single chamber permanent pacemaker, here for routine follow-up. She is is accompanied as always by her daughter. Dementia is slowly progressing. She did say a few words today, telling me that she wants to go home.  At her previous appointment we had tried decreasing her beta blocker due to complaints of fatigue. Interrogation of her pacemaker today shows that she has had relatively frequent episodes of high ventricular rates in the 100-1 130 bpm range since that reduction in dosage. Her Medtronic Sensia single chamber device was implanted in 2014 (initial device 2017) and still has an estimated 11 years of generator longevity. There is currently only 29% ventricular pacing.  To her dementia, the review of systems is very limited. She mostly complains of being tired all the time but seems to deny angina, dyspnea, focal neurological complaints. She has not had overt bleeding.  Past Medical History:  Diagnosis Date  . Acid reflux   . Atrial fibrillation (HCC)   . Atrophic vaginitis   . Chronic cystitis   . Decubitus ulcer of buttock   . Dementia   . Hyperlipidemia   . Hypertension   . Incomplete bladder emptying   . Microscopic hematuria   . Pacemaker   . Straining on urination   . Symptomatic bradycardia   . Urge incontinence of urine   . UTI (lower urinary tract infection)     Past Surgical History:  Procedure Laterality Date  . CARDIAC CATHETERIZATION  04/2004  . PACEMAKER GENERATOR CHANGE N/A 07/05/2013   Procedure: PACEMAKER GENERATOR CHANGE;  Surgeon: Thurmon FairMihai Tatianna Ibbotson, MD;  Location: [facility];  Service: Cardiovascular;  Laterality:  N/A;  . PACEMAKER INSERTION      Current Medications: Outpatient Medications Prior to Visit  Medication Sig Dispense Refill  . cephALEXin (KEFLEX) 250 MG capsule Take 1 capsule by mouth daily.    . ciprofloxacin (CIPRO) 250 MG tablet Take 1 tablet (250 mg total) by mouth 2 (two) times daily. 10 tablet 0  . furosemide (LASIX) 40 MG tablet Take 40 mg by mouth daily.     . metoprolol succinate (TOPROL-XL) 25 MG 24 hr tablet TAKE 1 AND 1/2 TABLETS BY MOUTH ONCE DAILY. 135 tablet 0  . Multiple Vitamins-Minerals (CENTRUM SILVER PO) Take 1 tablet by mouth daily.      Marland Kitchen. omeprazole (PRILOSEC) 20 MG capsule Take 20 mg by mouth daily.      . potassium chloride SA (K-DUR,KLOR-CON) 20 MEQ tablet Take 1 tablet by mouth daily.    . simvastatin (ZOCOR) 10 MG tablet Take 10 mg by mouth daily.    . Travoprost, BAK Free, (TRAVATAN Z) 0.004 % SOLN ophthalmic solution Place 1 drop into both eyes at bedtime.    Marland Kitchen. warfarin (COUMADIN) 3 MG tablet TAKE 1 TO 1 AND 1/2 TABLET BY MOUTH DAILY AS DIRECTED. 120 tablet 8   No facility-administered medications prior to visit.      Allergies:   Contrast media [iodinated diagnostic agents]; Penicillins; and Sulfa antibiotics   Social History   Socioeconomic History  . Marital status: Widowed    Spouse name: Not on file  .  Number of children: Not on file  . Years of education: Not on file  . Highest education level: Not on file  Social Needs  . Financial resource strain: Not on file  . Food insecurity - worry: Not on file  . Food insecurity - inability: Not on file  . Transportation needs - medical: Not on file  . Transportation needs - non-medical: Not on file  Occupational History  . Not on file  Tobacco Use  . Smoking status: Never Smoker  . Smokeless tobacco: Never Used  Substance and Sexual Activity  . Alcohol use: No  . Drug use: No  . Sexual activity: No  Other Topics Concern  . Not on file  Social History Narrative  . Not on file     Family  History:  The patient's family history includes Cancer in her other.   ROS:   Please see the history of present illness.    ROS All other systems reviewed and are negative.   PHYSICAL EXAM:   VS:  There were no vitals taken for this visit.   GEN: Well nourished, well developed, in no acute distress  HEENT: normal  Neck: no JVD, carotid bruits, or masses Cardiac: irregular, 2/6 apical holosystolic murmur, no diastolic murmurs, rubs, or gallops,no edema  Respiratory:  clear to auscultation bilaterally, normal work of breathing GI: soft, nontender, nondistended, + BS MS: no deformity or atrophy  Skin: warm and dry, no rash Neuro:  Alert and Oriented x 3, Strength and sensation are intact Psych: euthymic mood, full affect  Wt Readings from Last 3 Encounters:  09/12/17 164 lb (74.4 kg)  07/12/17 163 lb (73.9 kg)  10/04/16 163 lb 12.8 oz (74.3 kg)      Studies/Labs Reviewed:   EKG:  EKG is ordered today.  The ekg ordered today demonstrates Atrial fibrillation with occasional ventricular paced beats, left ventricular hypertrophy with repolarization abnormalities    ASSESSMENT:    No diagnosis found.   PLAN:  In order of problems listed above:  1. AFib: Ventricular rate control is poor. Will increase the beta blocker dose. Continue warfarin anticoagulation. 2. PPM: Mode downloads every 3 months and yearly office visits 3. Warfarin: CHADSVasc 4. Continue warfarin, but need to reassess bleeding/fall risk periodically 4. Moderate MR, not a surgical candidate 5. Dementia    Medication Adjustments/Labs and Tests Ordered: Current medicines are reviewed at length with the patient today.  Concerns regarding medicines are outlined above.  Medication changes, Labs and Tests ordered today are listed in the Patient Instructions below. There are no Patient Instructions on file for this visit.   Signed, Thurmon FairMihai Cohen Doleman, MD  11/08/2017 9:59 AM    [facility]  [facility] [address], [place], [place]  [address] Phone: [phone]; Fax: [phone]

## 2017-11-14 ENCOUNTER — Other Ambulatory Visit: Payer: Self-pay | Admitting: Cardiovascular Disease

## 2017-11-14 DIAGNOSIS — G301 Alzheimer's disease with late onset: Secondary | ICD-10-CM | POA: Diagnosis not present

## 2017-11-14 DIAGNOSIS — Z7901 Long term (current) use of anticoagulants: Secondary | ICD-10-CM | POA: Diagnosis not present

## 2017-11-14 DIAGNOSIS — I4891 Unspecified atrial fibrillation: Secondary | ICD-10-CM | POA: Diagnosis not present

## 2017-11-14 DIAGNOSIS — I480 Paroxysmal atrial fibrillation: Secondary | ICD-10-CM | POA: Diagnosis not present

## 2017-11-14 DIAGNOSIS — I5022 Chronic systolic (congestive) heart failure: Secondary | ICD-10-CM | POA: Diagnosis not present

## 2017-11-14 DIAGNOSIS — I10 Essential (primary) hypertension: Secondary | ICD-10-CM | POA: Diagnosis not present

## 2017-11-15 LAB — PROTIME-INR
INR: 1.9 — ABNORMAL HIGH
PROTHROMBIN TIME: 20.2 s — AB (ref 9.0–11.5)

## 2017-11-16 ENCOUNTER — Ambulatory Visit (INDEPENDENT_AMBULATORY_CARE_PROVIDER_SITE_OTHER): Payer: Medicare HMO | Admitting: Pharmacist Clinician (PhC)/ Clinical Pharmacy Specialist

## 2017-11-16 DIAGNOSIS — Z7901 Long term (current) use of anticoagulants: Secondary | ICD-10-CM

## 2017-11-16 DIAGNOSIS — I482 Chronic atrial fibrillation, unspecified: Secondary | ICD-10-CM

## 2017-11-17 ENCOUNTER — Telehealth: Payer: Self-pay | Admitting: Cardiovascular Disease

## 2017-11-17 NOTE — Telephone Encounter (Signed)
Closed Encounter  °

## 2017-12-04 ENCOUNTER — Inpatient Hospital Stay (HOSPITAL_COMMUNITY): Payer: Medicare HMO

## 2017-12-04 ENCOUNTER — Emergency Department (HOSPITAL_COMMUNITY): Payer: Medicare HMO

## 2017-12-04 ENCOUNTER — Encounter (HOSPITAL_COMMUNITY): Payer: Self-pay | Admitting: Emergency Medicine

## 2017-12-04 ENCOUNTER — Inpatient Hospital Stay (HOSPITAL_COMMUNITY)
Admission: EM | Admit: 2017-12-04 | Discharge: 2017-12-08 | DRG: 071 | Disposition: A | Discharge status: Skilled Nursing Facility | Payer: Medicare HMO | Attending: Internal Medicine | Admitting: Internal Medicine

## 2017-12-04 DIAGNOSIS — G9340 Encephalopathy, unspecified: Secondary | ICD-10-CM | POA: Diagnosis not present

## 2017-12-04 DIAGNOSIS — T796XXA Traumatic ischemia of muscle, initial encounter: Secondary | ICD-10-CM | POA: Diagnosis not present

## 2017-12-04 DIAGNOSIS — R4020 Unspecified coma: Secondary | ICD-10-CM | POA: Diagnosis not present

## 2017-12-04 DIAGNOSIS — M25519 Pain in unspecified shoulder: Secondary | ICD-10-CM | POA: Diagnosis not present

## 2017-12-04 DIAGNOSIS — G9341 Metabolic encephalopathy: Principal | ICD-10-CM | POA: Diagnosis present

## 2017-12-04 DIAGNOSIS — I4891 Unspecified atrial fibrillation: Secondary | ICD-10-CM | POA: Diagnosis not present

## 2017-12-04 DIAGNOSIS — Z950 Presence of cardiac pacemaker: Secondary | ICD-10-CM | POA: Diagnosis present

## 2017-12-04 DIAGNOSIS — T796XXD Traumatic ischemia of muscle, subsequent encounter: Secondary | ICD-10-CM | POA: Diagnosis not present

## 2017-12-04 DIAGNOSIS — R4182 Altered mental status, unspecified: Secondary | ICD-10-CM | POA: Diagnosis not present

## 2017-12-04 DIAGNOSIS — Z7401 Bed confinement status: Secondary | ICD-10-CM | POA: Diagnosis not present

## 2017-12-04 DIAGNOSIS — R1311 Dysphagia, oral phase: Secondary | ICD-10-CM | POA: Diagnosis not present

## 2017-12-04 DIAGNOSIS — B9620 Unspecified Escherichia coli [E. coli] as the cause of diseases classified elsewhere: Secondary | ICD-10-CM | POA: Diagnosis present

## 2017-12-04 DIAGNOSIS — E785 Hyperlipidemia, unspecified: Secondary | ICD-10-CM | POA: Diagnosis present

## 2017-12-04 DIAGNOSIS — Z7901 Long term (current) use of anticoagulants: Secondary | ICD-10-CM | POA: Diagnosis not present

## 2017-12-04 DIAGNOSIS — I517 Cardiomegaly: Secondary | ICD-10-CM | POA: Diagnosis not present

## 2017-12-04 DIAGNOSIS — I482 Chronic atrial fibrillation: Secondary | ICD-10-CM | POA: Diagnosis not present

## 2017-12-04 DIAGNOSIS — M79631 Pain in right forearm: Secondary | ICD-10-CM | POA: Diagnosis not present

## 2017-12-04 DIAGNOSIS — E860 Dehydration: Secondary | ICD-10-CM | POA: Diagnosis not present

## 2017-12-04 DIAGNOSIS — I10 Essential (primary) hypertension: Secondary | ICD-10-CM | POA: Diagnosis present

## 2017-12-04 DIAGNOSIS — F0391 Unspecified dementia with behavioral disturbance: Secondary | ICD-10-CM | POA: Diagnosis not present

## 2017-12-04 DIAGNOSIS — L89151 Pressure ulcer of sacral region, stage 1: Secondary | ICD-10-CM | POA: Diagnosis present

## 2017-12-04 DIAGNOSIS — W19XXXA Unspecified fall, initial encounter: Secondary | ICD-10-CM | POA: Diagnosis present

## 2017-12-04 DIAGNOSIS — R748 Abnormal levels of other serum enzymes: Secondary | ICD-10-CM

## 2017-12-04 DIAGNOSIS — S3993XA Unspecified injury of pelvis, initial encounter: Secondary | ICD-10-CM | POA: Diagnosis not present

## 2017-12-04 DIAGNOSIS — K219 Gastro-esophageal reflux disease without esophagitis: Secondary | ICD-10-CM | POA: Diagnosis present

## 2017-12-04 DIAGNOSIS — I340 Nonrheumatic mitral (valve) insufficiency: Secondary | ICD-10-CM | POA: Diagnosis present

## 2017-12-04 DIAGNOSIS — M6281 Muscle weakness (generalized): Secondary | ICD-10-CM | POA: Diagnosis not present

## 2017-12-04 DIAGNOSIS — L8990 Pressure ulcer of unspecified site, unspecified stage: Secondary | ICD-10-CM

## 2017-12-04 DIAGNOSIS — F0390 Unspecified dementia without behavioral disturbance: Secondary | ICD-10-CM | POA: Diagnosis not present

## 2017-12-04 DIAGNOSIS — R609 Edema, unspecified: Secondary | ICD-10-CM

## 2017-12-04 DIAGNOSIS — R262 Difficulty in walking, not elsewhere classified: Secondary | ICD-10-CM | POA: Diagnosis not present

## 2017-12-04 DIAGNOSIS — R279 Unspecified lack of coordination: Secondary | ICD-10-CM | POA: Diagnosis not present

## 2017-12-04 DIAGNOSIS — N390 Urinary tract infection, site not specified: Secondary | ICD-10-CM | POA: Diagnosis present

## 2017-12-04 DIAGNOSIS — M79601 Pain in right arm: Secondary | ICD-10-CM | POA: Diagnosis not present

## 2017-12-04 DIAGNOSIS — R41841 Cognitive communication deficit: Secondary | ICD-10-CM | POA: Diagnosis not present

## 2017-12-04 LAB — CBC WITH DIFFERENTIAL/PLATELET
BASOS PCT: 0 %
Basophils Absolute: 0 10*3/uL (ref 0.0–0.1)
Eosinophils Absolute: 0 10*3/uL (ref 0.0–0.7)
Eosinophils Relative: 0 %
HEMATOCRIT: 42.3 % (ref 36.0–46.0)
HEMOGLOBIN: 13.2 g/dL (ref 12.0–15.0)
LYMPHS ABS: 1.1 10*3/uL (ref 0.7–4.0)
Lymphocytes Relative: 11 %
MCH: 29.5 pg (ref 26.0–34.0)
MCHC: 31.2 g/dL (ref 30.0–36.0)
MCV: 94.4 fL (ref 78.0–100.0)
MONOS PCT: 9 %
Monocytes Absolute: 0.9 10*3/uL (ref 0.1–1.0)
NEUTROS ABS: 8.1 10*3/uL (ref 1.7–7.7)
NEUTROS PCT: 80 %
Platelets: 162 10*3/uL (ref 150–400)
RBC: 4.48 MIL/uL (ref 3.87–5.11)
RDW: 14.5 % (ref 11.5–15.5)
WBC: 10.1 10*3/uL (ref 4.0–10.5)

## 2017-12-04 LAB — COMPREHENSIVE METABOLIC PANEL
ALT: 15 U/L (ref 14–54)
ANION GAP: 11 (ref 5–15)
AST: 39 U/L (ref 15–41)
Albumin: 3.2 g/dL — ABNORMAL LOW (ref 3.5–5.0)
Alkaline Phosphatase: 63 U/L (ref 38–126)
BUN: 19 mg/dL (ref 6–20)
CALCIUM: 8.3 mg/dL — AB (ref 8.9–10.3)
CHLORIDE: 106 mmol/L (ref 101–111)
CO2: 21 mmol/L — AB (ref 22–32)
Creatinine, Ser: 0.94 mg/dL (ref 0.44–1.00)
GFR calc non Af Amer: 53 mL/min — ABNORMAL LOW (ref >60)
Glucose, Bld: 129 mg/dL — ABNORMAL HIGH (ref 65–99)
POTASSIUM: 4.6 mmol/L (ref 3.5–5.1)
SODIUM: 138 mmol/L (ref 135–145)
Total Bilirubin: 0.5 mg/dL (ref 0.3–1.2)
Total Protein: 6.6 g/dL (ref 6.5–8.1)

## 2017-12-04 LAB — BLOOD GAS, VENOUS
ACID-BASE DEFICIT: 3.4 mmol/L — AB (ref 0.0–2.0)
BICARBONATE: 21.5 mmol/L (ref 20.0–28.0)
FIO2: 21
O2 Saturation: 95.9 %
PCO2 VEN: 39.7 mmHg — AB (ref 44.0–60.0)
PH VEN: 7.35 (ref 7.250–7.430)
PO2 VEN: 85.5 mmHg — AB (ref 32.0–45.0)

## 2017-12-04 LAB — URINALYSIS, COMPLETE (UACMP) WITH MICROSCOPIC
BILIRUBIN URINE: NEGATIVE
Glucose, UA: NEGATIVE mg/dL
KETONES UR: 40 mg/dL — AB
NITRITE: POSITIVE — AB
PROTEIN: 100 mg/dL — AB
pH: 6 (ref 5.0–8.0)

## 2017-12-04 LAB — RAPID URINE DRUG SCREEN, HOSP PERFORMED
Amphetamines: NOT DETECTED
Barbiturates: NOT DETECTED
Benzodiazepines: NOT DETECTED
COCAINE: NOT DETECTED
OPIATES: NOT DETECTED
TETRAHYDROCANNABINOL: NOT DETECTED

## 2017-12-04 LAB — PROTIME-INR
INR: 1.98
Prothrombin Time: 22.3 seconds — ABNORMAL HIGH (ref 11.4–15.2)

## 2017-12-04 LAB — CK: Total CK: 1083 U/L — ABNORMAL HIGH (ref 38–234)

## 2017-12-04 LAB — CBG MONITORING, ED: Glucose-Capillary: 144 mg/dL — ABNORMAL HIGH (ref 65–99)

## 2017-12-04 MED ORDER — WARFARIN - PHARMACIST DOSING INPATIENT
Status: DC
  Administered 2017-12-05 – 2017-12-06 (×2)

## 2017-12-04 MED ORDER — SODIUM CHLORIDE 0.9 % IV SOLN
INTRAVENOUS | Status: AC
  Administered 2017-12-04 – 2017-12-05 (×3): via INTRAVENOUS

## 2017-12-04 MED ORDER — SIMVASTATIN 10 MG PO TABS
10.0000 mg | ORAL_TABLET | Freq: Every day | ORAL | Status: DC
  Administered 2017-12-04: 10 mg via ORAL
  Filled 2017-12-04: qty 1

## 2017-12-04 MED ORDER — SODIUM CHLORIDE 0.9 % IV BOLUS (SEPSIS)
1000.0000 mL | Freq: Once | INTRAVENOUS | Status: AC
  Administered 2017-12-04: 1000 mL via INTRAVENOUS

## 2017-12-04 MED ORDER — PANTOPRAZOLE SODIUM 40 MG PO TBEC
40.0000 mg | DELAYED_RELEASE_TABLET | Freq: Every day | ORAL | Status: DC
  Administered 2017-12-04 – 2017-12-08 (×5): 40 mg via ORAL
  Filled 2017-12-04 (×5): qty 1

## 2017-12-04 MED ORDER — SODIUM CHLORIDE 0.9 % IV BOLUS (SEPSIS)
1000.0000 mL | Freq: Once | INTRAVENOUS | Status: AC
Start: 2017-12-04 — End: 2017-12-04
  Administered 2017-12-04: 1000 mL via INTRAVENOUS

## 2017-12-04 MED ORDER — DEXTROSE 5 % IV SOLN
1.0000 g | Freq: Once | INTRAVENOUS | Status: AC
  Administered 2017-12-04: 1 g via INTRAVENOUS
  Filled 2017-12-04: qty 10

## 2017-12-04 MED ORDER — POTASSIUM CHLORIDE CRYS ER 20 MEQ PO TBCR
20.0000 meq | EXTENDED_RELEASE_TABLET | Freq: Every day | ORAL | Status: DC
  Administered 2017-12-04 – 2017-12-05 (×2): 20 meq via ORAL
  Filled 2017-12-04 (×2): qty 1

## 2017-12-04 MED ORDER — WARFARIN SODIUM 2 MG PO TABS
2.0000 mg | ORAL_TABLET | Freq: Once | ORAL | Status: AC
  Administered 2017-12-04: 2 mg via ORAL
  Filled 2017-12-04: qty 1

## 2017-12-04 MED ORDER — DEXTROSE 5 % IV SOLN
1.0000 g | INTRAVENOUS | Status: DC
  Administered 2017-12-05 – 2017-12-07 (×3): 1 g via INTRAVENOUS
  Filled 2017-12-04 (×6): qty 10

## 2017-12-04 MED ORDER — METOPROLOL SUCCINATE ER 25 MG PO TB24
37.5000 mg | ORAL_TABLET | Freq: Every day | ORAL | Status: DC
Start: 2017-12-04 — End: 2017-12-08
  Administered 2017-12-04 – 2017-12-08 (×5): 37.5 mg via ORAL
  Filled 2017-12-04 (×5): qty 2

## 2017-12-04 NOTE — H&P (Signed)
History and Physical    PAM VANALSTINE NFA:[id number] DOB: 01/25/1927 DOA: 12/04/2017  PCP: Avon Gully, MD   Patient coming from: Home  Chief Complaint: Confusion  HPI: [name] is a 84 y.o. female with medical history significant for atrial fibrillation on anticoagulation, pacemaker status, mitral insufficiency.  History obtained from patient's daughter, due to patient's mental status. Patient was brought to the ED via EMS after the patient was found by her daughter this morning.  Patient was found with her arm caught between the metal railing of her bed and the bed, sitting on the floor, with her back against the bed, for an unknown period of time. (Daughter [name], has a picture on her phone).  Patient was last seen yesterday evening at 9 PM.  Patient lives alone but has home health care and daughter will check on pt every day.  Patient's daughter notes also that over the past 5 days at least, patient has been confused.  Evaluated by PCP, who said patient did not have a UTI, but daughter gave her mother ciprofloxacin  For UTI anyways.  No vomiting or diarrhea, no complaints of cough or shortness of breath, no fever. At baseline patient can ambulate with a walker, has memory issues, daughter can hold conversation with the patient, though intermittently patient's would make statements that dont make sense.  ED Course: Blood pressure elevated 160/58 , pulse fluctuates 60s to low 100s. Otherwise stable vitals.  UA -moderate leukocytes positive nitrite and many bacteria, CK elevated 1083, BMP CBC unremarkable.  Head CT , chest x-ray negative for acute abnormality, x-ray right humerus and forearm-negative for fracture.  Urine and blood cultures were drawn.  Patient was started on IV ceftriaxone, hospitalist was called to admit for altered mental status and UTI  Review of Systems: Could not be obtained due to mental patient's mental status-not responding to questions.  Past Medical History:    Diagnosis Date  . Acid reflux   . Atrial fibrillation (HCC)   . Atrophic vaginitis   . Chronic cystitis   . Decubitus ulcer of buttock   . Dementia   . Hyperlipidemia   . Hypertension   . Incomplete bladder emptying   . Microscopic hematuria   . Pacemaker   . Straining on urination   . Symptomatic bradycardia   . Urge incontinence of urine   . UTI (lower urinary tract infection)     Past Surgical History:  Procedure Laterality Date  . CARDIAC CATHETERIZATION  04/2004  . PACEMAKER GENERATOR CHANGE N/A 07/05/2013   Procedure: PACEMAKER GENERATOR CHANGE;  Surgeon: Thurmon Fair, MD;  Location: [facility];  Service: Cardiovascular;  Laterality: N/A;  . PACEMAKER INSERTION       reports that  has never smoked. she has never used smokeless tobacco. She reports that she does not drink alcohol or use drugs.  Allergies  Allergen Reactions  . Contrast Media [Iodinated Diagnostic Agents] Other (See Comments)    "shaking"  . Penicillins Other (See Comments)    Has patient had a PCN reaction causing immediate rash, facial/tongue/throat swelling, SOB or lightheadedness with hypotension: Unknown Has patient had a PCN reaction causing severe rash involving mucus membranes or skin necrosis: Unknown Has patient had a PCN reaction that required hospitalization: Unknown Has patient had a PCN reaction occurring within the last 10 years: Unknown If all of the above answers are "NO", then may proceed with Cephalosporin use.  . Sulfa Antibiotics     Family History  Problem Relation Age of Onset  . Cancer Other     Prior to Admission medications   Medication Sig Start Date End Date Taking? Authorizing Provider  cephALEXin (KEFLEX) 250 MG capsule Take 1 capsule by mouth daily. 08/04/16  Yes [provider]  ciprofloxacin (CIPRO) 250 MG tablet Take 1 tablet (250 mg total) by mouth 2 (two) times daily. 09/12/17  Yes Raeford RazorKohut, Stephen, MD  furosemide (LASIX) 40 MG tablet Take 40 mg by  mouth daily.  09/25/14  Yes [provider]  metoprolol succinate (TOPROL-XL) 25 MG 24 hr tablet TAKE 1 AND 1/2 TABLETS BY MOUTH ONCE DAILY. 09/21/17  Yes Croitoru, Mihai, MD  Multiple Vitamins-Minerals (CENTRUM SILVER PO) Take 1 tablet by mouth daily.     Yes [provider]  omeprazole (PRILOSEC) 20 MG capsule Take 20 mg by mouth daily.     Yes [provider]  potassium chloride SA (K-DUR,KLOR-CON) 20 MEQ tablet Take 1 tablet by mouth daily. 09/24/14  Yes [provider]  simvastatin (ZOCOR) 10 MG tablet Take 10 mg by mouth daily.   Yes [provider]  Travoprost, BAK Free, (TRAVATAN Z) 0.004 % SOLN ophthalmic solution Place 1 drop into both eyes at bedtime. 06/13/17  Yes [provider]  warfarin (COUMADIN) 3 MG tablet TAKE 1 TO 1 AND 1/2 TABLET BY MOUTH DAILY AS DIRECTED. 02/01/17  Yes Croitoru, Mihai, MD    Physical Exam: Limited by patient's mental status, but followed some directions Vitals:   12/04/17 1300 12/04/17 1330 12/04/17 1400 12/04/17 1430  BP: (!) 141/55 (!) 159/63 (!) 162/74 (!) 151/63  Pulse: (!) 131 98 86 87  Resp: (!) 21 20 19 17   Temp:      TempSrc:      SpO2: 97% 97% 94% 98%  Weight:      Height:        Constitutional: NAD, calm, comfortable Vitals:   12/04/17 1300 12/04/17 1330 12/04/17 1400 12/04/17 1430  BP: (!) 141/55 (!) 159/63 (!) 162/74 (!) 151/63  Pulse: (!) 131 98 86 87  Resp: (!) 21 20 19 17   Temp:      TempSrc:      SpO2: 97% 97% 94% 98%  Weight:      Height:       Eyes: PERRL, lids and conjunctivae normal ENMT: Mucous membranes are dry.  Neck: normal, supple, no masses, no thyromegaly Respiratory: clear to auscultation bilaterally-anterior auscultation, no wheezing, no crackles. Normal respiratory effort. No accessory muscle use.  Cardiovascular: irregular rate and rhythm, no murmurs / rubs / gallops. No extremity edema. 2+ pedal pulses.   Abdomen: no tenderness, no masses palpated. No  hepatosplenomegaly. Bowel sounds positive.  Musculoskeletal: no clubbing / cyanosis. Left big toe abduction, from prior trauma.LEft knee bigger than right, without tenderness or erythema, both findings normal for pt  Skin: no rashes, lesions, ulcers. No induration Neurologic: Limited by patient's mental status.  Psychiatric:  Alert, awake, moans when touched but otherwise not answering questions  Labs on Admission: I have personally reviewed following labs and imaging studies  CBC: Recent Labs  Lab 12/04/17 1333  WBC 10.1  NEUTROABS 8.1  HGB 13.2  HCT 42.3  MCV 94.4  PLT 162   Basic Metabolic Panel: Recent Labs  Lab 12/04/17 1333  NA 138  K 4.6  CL 106  CO2 21*  GLUCOSE 129*  BUN 19  CREATININE 0.94  CALCIUM 8.3*   Liver Function Tests: Recent Labs  Lab 12/04/17 1333  AST 39  ALT 15  ALKPHOS 63  BILITOT 0.5  PROT 6.6  ALBUMIN 3.2*   Cardiac Enzymes: Recent Labs  Lab 12/04/17 1333  CKTOTAL 1,083*   CBG: Recent Labs  Lab 12/04/17 1343  GLUCAP 144*   Urine analysis:    Component Value Date/Time   COLORURINE YELLOW 12/04/2017 1243   APPEARANCEUR CLOUDY (A) 12/04/2017 1243   LABSPEC >1.030 (H) 12/04/2017 1243   PHURINE 6.0 12/04/2017 1243   GLUCOSEU NEGATIVE 12/04/2017 1243   HGBUR LARGE (A) 12/04/2017 1243   BILIRUBINUR NEGATIVE 12/04/2017 1243   KETONESUR 40 (A) 12/04/2017 1243   PROTEINUR 100 (A) 12/04/2017 1243   UROBILINOGEN 0.2 10/05/2015 1208   NITRITE POSITIVE (A) 12/04/2017 1243   LEUKOCYTESUR MODERATE (A) 12/04/2017 1243    Radiological Exams on Admission: Dg Chest 2 View  Result Date: 12/04/2017 CLINICAL DATA:  Altered mental status EXAM: CHEST  2 VIEW COMPARISON:  07/12/2017 chest radiograph. FINDINGS: Stable configuration of 2 lead left subclavian pacemaker. Stable cardiomediastinal silhouette with mild cardiomegaly and aortic atherosclerosis. No pneumothorax. No pleural effusion. Stable mild scarring versus atelectasis at the left  lung base. No overt pulmonary edema. No acute consolidative airspace disease. Vertebral compression fractures in the lower thoracic/ upper lumbar spine are not appreciably changed since 09/09/2016 CT. IMPRESSION: 1. Stable cardiomegaly without overt pulmonary edema. 2. Stable mild scarring versus atelectasis at the left lung base. Electronically Signed   By: Delbert Phenix M.D.   On: 12/04/2017 13:35   Dg Forearm Right  Result Date: 12/04/2017 CLINICAL DATA:  Arm caught in mid railing.  Pain. EXAM: RIGHT FOREARM - 2 VIEW COMPARISON:  None. FINDINGS: Old healed ulnar shaft fracture. No sign of acute injury. Regional vascular calcification. IMPRESSION: No acute finding. Electronically Signed   By: Paulina Fusi M.D.   On: 12/04/2017 13:37   Ct Head Wo Contrast  Result Date: 12/04/2017 CLINICAL DATA:  84 year old with altered level of consciousness. EXAM: CT HEAD WITHOUT CONTRAST TECHNIQUE: Contiguous axial images were obtained from the base of the skull through the vertex without intravenous contrast. COMPARISON:  09/12/2017 FINDINGS: Brain: Stable diffuse cerebral atrophy. Again noted is low-density in the subcortical white matter particularly in the anterior right parietal lobe. No evidence for acute hemorrhage, mass lesion, midline shift, hydrocephalus or large infarct. Vascular: No hyperdense vessel or unexpected calcification. Skull: Normal. Negative for fracture or focal lesion. Sinuses/Orbits: Chronic changes and mucosal disease in the left maxillary sinus. Mucosal disease in the ethmoid air cells. Other: None. IMPRESSION: No acute intracranial abnormality. Stable atrophy and evidence for chronic small vessel ischemic changes. Electronically Signed   By: Richarda Overlie M.D.   On: 12/04/2017 13:47   Dg Humerus Right  Result Date: 12/04/2017 CLINICAL DATA:  All arm call out and bad railing.  Pain. EXAM: RIGHT HUMERUS - 2+ VIEW COMPARISON:  None. FINDINGS: There is no evidence of fracture or other focal bone  lesions. Soft tissues are unremarkable. IMPRESSION: Negative. Electronically Signed   By: Paulina Fusi M.D.   On: 12/04/2017 13:36    EKG: Independently reviewed.  Atrial fibrillation, flipped T waves V3 only.  Assessment/Plan Principal Problem:   Metabolic encephalopathy Active Problems:   Atrial fibrillation (HCC)   Long term current use of anticoagulant therapy   Pacemaker - Medtronic 2007, new generator 2014   Mitral insufficiency  Metabolic encephalopathy-likely multifactorial, UTI, dehydration from poor recent p.o. Intake-dry mucous membranes, progression of dementia. -IV ceftriaxone 1 g daily -Follow-up blood cultures and urine cultures  drawn in ED -Physical therapy evaluation  Rhabdomyolysis-CK 1083.  Creatinine at baseline.  Rhabdo most likely from right forearm, possibly prolonged immobility. - n/s 100cc/hr X 1 day -CK a.m.  Atrial fibrillation, pacemaker status-rates mostly controlled.  On chronic anticoagulation with warfarin. -INR check -Pharmacy consult for warfarin -Continue home metoprolol XL 37.5mg  daily  DVT prophylaxis: Warfarin Code Status: Full Family Communication: Patient's daughter [name] at bedside Disposition Plan: 2-3 days , to be determined-followed PT recs Consults called: None Admission status: Inpatient telemetry   Onnie Boer MD [facility] Pager [phone]  If 11PM-7AM, please contact night-coverage [email or web address] Password [email or web address]  12/04/2017, 5:52 PM

## 2017-12-04 NOTE — ED Triage Notes (Signed)
Pt from home.  Last seen last night at 9pm.  Found this morning hanging out of bed with her right arm caught in bed railing.  Fire department had to cut metal railing to free her arm.  Pt is lethargic on arrival to ED and is only oriented to name.

## 2017-12-04 NOTE — ED Provider Notes (Signed)
Emergency Department Provider Note   I have reviewed the triage vital signs and the nursing notes.   HISTORY  Chief Complaint Altered Mental Status   HPI [name] is a 84 y.o. female who is unable to provide history secondary to the situation.  Her daughter supplies a history that the patient's been confused for the last few days.  Sometime last night the patient was a little bit confused but otherwise acting normally and then this morning when the daughter went to check on her she was stuck in the rail of the bed and unable to move an unknown time frame that she had been there.  Daughter states she thought she had a urinary tract infection recently but the culture was negative however she is treating her with ciprofloxacin anyway.  At this time the [facility] which is apparently improved from this morning.  No fevers. No other associated or modifying symptoms.   Level V Caveat secondary to acuity and dementia.   Past Medical History:  Diagnosis Date  . Acid reflux   . Atrial fibrillation (HCC)   . Atrophic vaginitis   . Chronic cystitis   . Decubitus ulcer of buttock   . Dementia   . Hyperlipidemia   . Hypertension   . Incomplete bladder emptying   . Microscopic hematuria   . Pacemaker   . Straining on urination   . Symptomatic bradycardia   . Urge incontinence of urine   . UTI (lower urinary tract infection)     Patient Active Problem List   Diagnosis Date Noted  . Hyperlipidemia 11/14/2013  . Mitral insufficiency 11/14/2013  . Symptomatic bradycardia 07/05/2013  . Pacemaker - Medtronic 2007, new generator 2014 07/05/2013  . Atrial fibrillation (HCC) 03/15/2013  . Long term current use of anticoagulant therapy 03/15/2013    Past Surgical History:  Procedure Laterality Date  . CARDIAC CATHETERIZATION  04/2004  . PACEMAKER GENERATOR CHANGE N/A 07/05/2013   Procedure: PACEMAKER GENERATOR CHANGE;  Surgeon: Thurmon FairMihai Croitoru, MD;   Location: [facility] CATH [facility];  Service: Cardiovascular;  Laterality: N/A;  . PACEMAKER INSERTION      Current Outpatient Rx  . Order #: [id number] Class: Historical Med  . Order #: [id number] Class: Print  . Order #: [id number] Class: Historical Med  . Order #: [id number] Class: Normal  . Order #: [id number] Class: Historical Med  . Order #: [id number] Class: Historical Med  . Order #: [id number] Class: Historical Med  . Order #: [id number] Class: Historical Med  . Order #: [id number] Class: Historical Med  . Order #: [id number] Class: Normal    Allergies Contrast media [iodinated diagnostic agents]; Penicillins; and Sulfa antibiotics  Family History  Problem Relation Age of Onset  . Cancer Other     Social History Social History   Tobacco Use  . Smoking status: Never Smoker  . Smokeless tobacco: Never Used  Substance Use Topics  . Alcohol use: No  . Drug use: No    Review of Systems  Level V Caveat secondary to acuity and dementia.  ____________________________________________   PHYSICAL EXAM:  VITAL SIGNS: ED Triage Vitals  Enc Vitals Group     BP 12/04/17 1104 (!) 168/58     Pulse Rate 12/04/17 1106 92     Resp 12/04/17 1106 18     Temp 12/04/17 1104 98.5 F (36.9 C)     Temp Source 12/04/17 1104 Rectal     SpO2 12/04/17 1106 95 %     Weight 12/04/17 1058  164 lb (74.4 kg)     Height 12/04/17 1058 5\' 6"  (1.676 m)    Constitutional: Alert. Well appearing and in no acute distress. Eyes: Conjunctivae are slightly injected. PERRL. EOMI. sunken Head: Atraumatic. Nose: No congestion/rhinnorhea. Mouth/Throat: Mucous membranes are moist.  Oropharynx non-erythematous. Neck: No stridor.  No meningeal signs.   Cardiovascular: Normal rate, regular rhythm. Good peripheral circulation. Grossly normal heart sounds.   Respiratory: tachypneic respiratory effort. Hypoxic.  No retractions. Lungs CTAB. Gastrointestinal: Soft and nontender. No distention.  Musculoskeletal: No lower extremity  tenderness nor edema. No gross deformities of extremities. Right arm with ttp around elbow and ecchymosis proximal forearm. Neurologic: patient not full following commands, difficult to assess accurately.   Skin:  Skin is warm, dry and intact. No rash noted.   ____________________________________________   LABS (all labs ordered are listed, but only abnormal results are displayed)  Labs Reviewed  COMPREHENSIVE METABOLIC PANEL - Abnormal; Notable for the following components:      Result Value   CO2 21 (*)    Glucose, Bld 129 (*)    Calcium 8.3 (*)    Albumin 3.2 (*)    GFR calc non Af Amer 53 (*)    All other components within normal limits  URINALYSIS, COMPLETE (UACMP) WITH MICROSCOPIC - Abnormal; Notable for the following components:   APPearance CLOUDY (*)    Specific Gravity, Urine >1.030 (*)    Hgb urine dipstick LARGE (*)    Ketones, ur 40 (*)    Protein, ur 100 (*)    Nitrite POSITIVE (*)    Leukocytes, UA MODERATE (*)    Squamous Epithelial / LPF 0-5 (*)    Bacteria, UA MANY (*)    All other components within normal limits  BLOOD GAS, VENOUS - Abnormal; Notable for the following components:   pCO2, Ven 39.7 (*)    pO2, Ven 85.5 (*)    Acid-base deficit 3.4 (*)    All other components within normal limits  CK - Abnormal; Notable for the following components:   Total CK 1,083 (*)    All other components within normal limits  CBG MONITORING, ED - Abnormal; Notable for the following components:   Glucose-Capillary 144 (*)    All other components within normal limits  CULTURE, BLOOD (ROUTINE X 2)  CULTURE, BLOOD (ROUTINE X 2)  CBC WITH DIFFERENTIAL/PLATELET  RAPID URINE DRUG SCREEN, HOSP PERFORMED   ____________________________________________  EKG   EKG Interpretation  Date/Time:  Monday December 04 2017 11:09:41 EST Ventricular Rate:  115 PR Interval:    QRS Duration: 124 QT Interval:  349 QTC Calculation: 483 R Axis:   27 Text Interpretation:  Atrial  fibrillation IVCD, consider atypical RBBB ST depr, consider ischemia, anterolateral lds Afib appears new from october 2018 when she had a paced rhythm BBB appears new from then as well ST changes were present then Confirmed by Marily Memos [phone]) on 12/04/2017 1:12:50 PM       ____________________________________________  RADIOLOGY  Dg Chest 2 View  Result Date: 12/04/2017 CLINICAL DATA:  Altered mental status EXAM: CHEST  2 VIEW COMPARISON:  07/12/2017 chest radiograph. FINDINGS: Stable configuration of 2 lead left subclavian pacemaker. Stable cardiomediastinal silhouette with mild cardiomegaly and aortic atherosclerosis. No pneumothorax. No pleural effusion. Stable mild scarring versus atelectasis at the left lung base. No overt pulmonary edema. No acute consolidative airspace disease. Vertebral compression fractures in the lower thoracic/ upper lumbar spine are not appreciably changed since 09/09/2016 CT. IMPRESSION: 1. Stable  cardiomegaly without overt pulmonary edema. 2. Stable mild scarring versus atelectasis at the left lung base. Electronically Signed   By: Delbert Phenix M.D.   On: 12/04/2017 13:35   Dg Forearm Right  Result Date: 12/04/2017 CLINICAL DATA:  Arm caught in mid railing.  Pain. EXAM: RIGHT FOREARM - 2 VIEW COMPARISON:  None. FINDINGS: Old healed ulnar shaft fracture. No sign of acute injury. Regional vascular calcification. IMPRESSION: No acute finding. Electronically Signed   By: Paulina Fusi M.D.   On: 12/04/2017 13:37   Ct Head Wo Contrast  Result Date: 12/04/2017 CLINICAL DATA:  84 year old with altered level of consciousness. EXAM: CT HEAD WITHOUT CONTRAST TECHNIQUE: Contiguous axial images were obtained from the base of the skull through the vertex without intravenous contrast. COMPARISON:  09/12/2017 FINDINGS: Brain: Stable diffuse cerebral atrophy. Again noted is low-density in the subcortical white matter particularly in the anterior right parietal lobe. No evidence for  acute hemorrhage, mass lesion, midline shift, hydrocephalus or large infarct. Vascular: No hyperdense vessel or unexpected calcification. Skull: Normal. Negative for fracture or focal lesion. Sinuses/Orbits: Chronic changes and mucosal disease in the left maxillary sinus. Mucosal disease in the ethmoid air cells. Other: None. IMPRESSION: No acute intracranial abnormality. Stable atrophy and evidence for chronic small vessel ischemic changes. Electronically Signed   By: Richarda Overlie M.D.   On: 12/04/2017 13:47   Dg Humerus Right  Result Date: 12/04/2017 CLINICAL DATA:  All arm call out and bad railing.  Pain. EXAM: RIGHT HUMERUS - 2+ VIEW COMPARISON:  None. FINDINGS: There is no evidence of fracture or other focal bone lesions. Soft tissues are unremarkable. IMPRESSION: Negative. Electronically Signed   By: Paulina Fusi M.D.   On: 12/04/2017 13:36    ____________________________________________   PROCEDURES  Procedure(s) performed:   Procedures   ____________________________________________   INITIAL IMPRESSION / ASSESSMENT AND PLAN / ED COURSE  Patient appears clinically dehydrated but also with atrial fibrillation which may or may not be new.  She is on some type of Coumadin at home.  These all increase her risk of having stroke, head bleed, infection, dehydration.  Evaluate for all of these things.  Patient likely will need to stay in the hospital. On multiple reevaluations, still not talking normally and not acting herself. CK elevated but no e/o AKI to suggest rhabdo at this point. UA infected.  Rocephin/fluids administered. Plan for hospital observation to ensure encephalopathy improves.   Pertinent labs & imaging results that were available during my care of the patient were reviewed by me and considered in my medical decision making (see chart for details).  ____________________________________________  FINAL CLINICAL IMPRESSION(S) / ED DIAGNOSES  Final diagnoses:    Encephalopathy  Elevated CK  Urinary tract infection without hematuria, site unspecified     MEDICATIONS GIVEN DURING THIS VISIT:  Medications  sodium chloride 0.9 % bolus 1,000 mL (0 mLs Intravenous Stopped 12/04/17 1446)  sodium chloride 0.9 % bolus 1,000 mL (1,000 mLs Intravenous New Bag/Given 12/04/17 1447)  cefTRIAXone (ROCEPHIN) 1 g in dextrose 5 % 50 mL IVPB (1 g Intravenous New Bag/Given 12/04/17 1510)     Kenyona Rena, Barbara Cower, MD 12/04/17 1656

## 2017-12-04 NOTE — Progress Notes (Signed)
ANTICOAGULATION CONSULT NOTE  Pharmacy Consult for Warfarin Indication: atrial fibrillation  Allergies  Allergen Reactions  . Contrast Media [Iodinated Diagnostic Agents] Other (See Comments)    "shaking"  . Penicillins Other (See Comments)    Has patient had a PCN reaction causing immediate rash, facial/tongue/throat swelling, SOB or lightheadedness with hypotension: Unknown Has patient had a PCN reaction causing severe rash involving mucus membranes or skin necrosis: Unknown Has patient had a PCN reaction that required hospitalization: Unknown Has patient had a PCN reaction occurring within the last 10 years: Unknown If all of the above answers are "NO", then may proceed with Cephalosporin use.  . Sulfa Antibiotics     Patient Measurements: Height: 5\' 6"  (167.6 cm) Weight: 155 lb 10.3 oz (70.6 kg) IBW/kg (Calculated) : 59.3 Heparin Dosing Weight:   Vital Signs: Temp: 98.7 F (37.1 C) (01/07 2021) Temp Source: Oral (01/07 2021) BP: 148/49 (01/07 2021) Pulse Rate: 68 (01/07 2021)  Labs: Recent Labs    12/04/17 1333 12/04/17 1754  HGB 13.2  --   HCT 42.3  --   PLT 162  --   LABPROT  --  22.3*  INR  --  1.98  CREATININE 0.94  --   CKTOTAL 1,083*  --     Estimated Creatinine Clearance: 38.7 mL/min (by C-G formula based on SCr of 0.94 mg/dL).   Medical History: Past Medical History:  Diagnosis Date  . Acid reflux   . Atrial fibrillation (HCC)   . Atrophic vaginitis   . Chronic cystitis   . Decubitus ulcer of buttock   . Dementia   . Hyperlipidemia   . Hypertension   . Incomplete bladder emptying   . Microscopic hematuria   . Pacemaker   . Straining on urination   . Symptomatic bradycardia   . Urge incontinence of urine   . UTI (lower urinary tract infection)     Medications:  Medications Prior to Admission  Medication Sig Dispense Refill Last Dose  . cephALEXin (KEFLEX) 250 MG capsule Take 1 capsule by mouth daily.   Past Week at Unknown time  .  ciprofloxacin (CIPRO) 250 MG tablet Take 1 tablet (250 mg total) by mouth 2 (two) times daily. 10 tablet 0 Past Week at Unknown time  . furosemide (LASIX) 40 MG tablet Take 40 mg by mouth daily.    Past Week at Unknown time  . metoprolol succinate (TOPROL-XL) 25 MG 24 hr tablet TAKE 1 AND 1/2 TABLETS BY MOUTH ONCE DAILY. 135 tablet 0 Past Week at Unknown time  . Multiple Vitamins-Minerals (CENTRUM SILVER PO) Take 1 tablet by mouth daily.     Past Week at Unknown time  . omeprazole (PRILOSEC) 20 MG capsule Take 20 mg by mouth daily.     Past Week at Unknown time  . potassium chloride SA (K-DUR,KLOR-CON) 20 MEQ tablet Take 1 tablet by mouth daily.   Past Week at Unknown time  . simvastatin (ZOCOR) 10 MG tablet Take 10 mg by mouth daily.   Past Week at Unknown time  . Travoprost, BAK Free, (TRAVATAN Z) 0.004 % SOLN ophthalmic solution Place 1 drop into both eyes at bedtime.   Past Week at Unknown time  . warfarin (COUMADIN) 3 MG tablet TAKE 1 TO 1 AND 1/2 TABLET BY MOUTH DAILY AS DIRECTED. (Patient taking differently: TAKE 1 TABLET BY MOUTH DAILY except takes 1/2 tablet (1.5mg ) on Mondays.Marland Kitchen.) 120 tablet 8 Past Week at Unknown time    Assessment: Okay for Protocol, INR  slightly below goal. No bleeding noted.  Goal of Therapy:  INR 2-3   Plan:  Warfarin 2mg  PO x 1 Daily PT/INR Monitor for signs and symptoms of bleeding.   Mady Gemma 12/04/2017,8:29 PM

## 2017-12-05 ENCOUNTER — Other Ambulatory Visit: Payer: Self-pay

## 2017-12-05 DIAGNOSIS — T796XXD Traumatic ischemia of muscle, subsequent encounter: Secondary | ICD-10-CM

## 2017-12-05 DIAGNOSIS — L8990 Pressure ulcer of unspecified site, unspecified stage: Secondary | ICD-10-CM

## 2017-12-05 LAB — PROTIME-INR
INR: 2.56
PROTHROMBIN TIME: 27.3 s — AB (ref 11.4–15.2)

## 2017-12-05 LAB — CK: CK TOTAL: 801 U/L — AB (ref 38–234)

## 2017-12-05 MED ORDER — ORAL CARE MOUTH RINSE
15.0000 mL | Freq: Two times a day (BID) | OROMUCOSAL | Status: DC
  Administered 2017-12-05 – 2017-12-08 (×5): 15 mL via OROMUCOSAL

## 2017-12-05 MED ORDER — WARFARIN SODIUM 1 MG PO TABS
1.0000 mg | ORAL_TABLET | Freq: Once | ORAL | Status: AC
Start: 2017-12-05 — End: 2017-12-05
  Administered 2017-12-05: 1 mg via ORAL
  Filled 2017-12-05: qty 1

## 2017-12-05 NOTE — Plan of Care (Signed)
Pt progressing

## 2017-12-05 NOTE — Progress Notes (Signed)
PROGRESS NOTE                                                                                                                                                                                                             Patient Demographics:    [name], is a 84 y.o. female, DOB - Dec 18, 1926, ZOX:[id number]  Admit date - 12/04/2017   Admitting Physician Courage Mariea Clonts, MD  Outpatient Primary MD for the patient is Avon Gully, MD  LOS - 1  Outpatient Specialists: None  Chief Complaint  Patient presents with  . Altered Mental Status       Brief Narrative  84 year old female who lives alone in her apartment with history of A. fib on anti-evaluation, this make her status, and mitral insufficiency brought to the ED by EMS after she was found on the side of the bed by her daughter for on known duration with her arm caught between the metal railing of her bed. The trailing had to be cut to let her on low-dose. She was last seen the previous night (over 12 hours back). Per daughter patient has slow progressive confusion over past several months but for the past 5 days symptoms have worsened. She was seen by her PCP recently who thought patient did not have any infection or UTI but the daughter was still giving patient some ciprofloxacin. Denied any fevers, chills, nausea, vomiting, diarrhea, shortness of breath, chest pain or abdominal pain. At baseline patient ambulates with a walker, has slow progressive confusion but is able to carry out some conversation.  In the ED patient was tachycardic to 130s, elevated blood pressure. Blood work showed UA suggestive of UTI and elevated CK of 1000. CT head, chest x-ray negative for acute findings. X-ray of the right humerus and forearm negative for fracture. Culture sent and patient started on IV Rocephin. Hospitalist consulted for admission for UTI with acute metabolic encephalopathy and  rhabdomyolysis.   Subjective:   Seen and examined. She is more communicative today per daughter and almost 75% of her baseline mental status.   Assessment  & Plan :    Principal Problem: Acute metabolic encephalopathy   possibly due to UTI. Follow urine culture, blood culture. Continue empiric Rocephin. Mentation improving per daughter. Continue IV hydration.  Active Problems: Rhabdomyolysis  secondary to fall. CPK trending down. Continue  hydration. Discontinue simvastatin.    Atrial fibrillation (HCC)   Pacemaker status Rate controlled currently. Continue Coumadin with dosing per pharmacy. Continue metoprolol.  Generalized weakness   PT evaluation. May benefit from SNF.     Code Status : Full code  Family Communication  : Daughter at bedside  Disposition Plan  : Pending clinical improvement and PT evaluation. Possibly in the next 1-2 days  Barriers For Discharge : Active symptoms  Consults  :  None  Procedures  : CT head  DVT Prophylaxis  :  Coumadin  Lab Results  Component Value Date   PLT 162 12/04/2017    Antibiotics  :    Anti-infectives (From admission, onward)   Start     Dose/Rate Route Frequency Ordered Stop   12/05/17 1500  cefTRIAXone (ROCEPHIN) 1 g in dextrose 5 % 50 mL IVPB     1 g 100 mL/hr over 30 Minutes Intravenous Every 24 hours 12/04/17 2014     12/04/17 1445  cefTRIAXone (ROCEPHIN) 1 g in dextrose 5 % 50 mL IVPB     1 g 100 mL/hr over 30 Minutes Intravenous  Once 12/04/17 1435 12/04/17 1540        Objective:   Vitals:   12/04/17 2000 12/04/17 2021 12/05/17 0626 12/05/17 1040  BP:  (!) 148/49 (!) 132/58 140/86  Pulse:  68 79 75  Resp:  20 20   Temp:  98.7 F (37.1 C) 98.4 F (36.9 C)   TempSrc:  Oral Oral   SpO2:  100% 100%   Weight: 70.6 kg (155 lb 10.3 oz)     Height:  5\' 6"  (1.676 m)      Wt Readings from Last 3 Encounters:  12/04/17 70.6 kg (155 lb 10.3 oz)  09/12/17 74.4 kg (164 lb)  07/12/17 73.9 kg (163 lb)      Intake/Output Summary (Last 24 hours) at 12/05/2017 1146 Last data filed at 12/05/2017 0626 Gross per 24 hour  Intake 1571.67 ml  Output 200 ml  Net 1371.67 ml     Physical Exam  Gen: not in distress HEENT: no pallor, moist mucosa, supple neck Chest: clear b/l, no added sounds CVS: S1 and S2 irregular, no murmurs rub or gallop GI: soft, NT, ND, BS+ Musculoskeletal: warm, chronic right knee swelling , no edema, bruising over right forearm CNS: AAOX1-2, nonfocal    Data Review:    CBC Recent Labs  Lab 12/04/17 1333  WBC 10.1  HGB 13.2  HCT 42.3  PLT 162  MCV 94.4  MCH 29.5  MCHC 31.2  RDW 14.5  LYMPHSABS 1.1  MONOABS 0.9  EOSABS 0.0  BASOSABS 0.0    Chemistries  Recent Labs  Lab 12/04/17 1333  NA 138  K 4.6  CL 106  CO2 21*  GLUCOSE 129*  BUN 19  CREATININE 0.94  CALCIUM 8.3*  AST 39  ALT 15  ALKPHOS 63  BILITOT 0.5   ------------------------------------------------------------------------------------------------------------------ No results for input(s): CHOL, HDL, LDLCALC, TRIG, CHOLHDL, LDLDIRECT in the last 72 hours.  No results found for: HGBA1C ------------------------------------------------------------------------------------------------------------------ No results for input(s): TSH, T4TOTAL, T3FREE, THYROIDAB in the last 72 hours.  Invalid input(s): FREET3 ------------------------------------------------------------------------------------------------------------------ No results for input(s): VITAMINB12, FOLATE, FERRITIN, TIBC, IRON, RETICCTPCT in the last 72 hours.  Coagulation profile Recent Labs  Lab 12/04/17 1754 12/05/17 0624  INR 1.98 2.56    No results for input(s): DDIMER in the last 72 hours.  Cardiac Enzymes No results for input(s): CKMB, TROPONINI, MYOGLOBIN in  the last 168 hours.  Invalid input(s):  CK ------------------------------------------------------------------------------------------------------------------ No results found for: BNP  Inpatient Medications  Scheduled Meds: . mouth rinse  15 mL Mouth Rinse BID  . metoprolol succinate  37.5 mg Oral Daily  . pantoprazole  40 mg Oral Daily  . potassium chloride SA  20 mEq Oral Daily  . simvastatin  10 mg Oral q1800  . warfarin  1 mg Oral Once  . Warfarin - Pharmacist Dosing Inpatient   Does not apply Q24H   Continuous Infusions: . sodium chloride 100 mL/hr at 12/05/17 0801  . cefTRIAXone (ROCEPHIN)  IV     PRN Meds:.  Micro Results Recent Results (from the past 240 hour(s))  Blood culture (routine x 2)     Status: None (Preliminary result)   Collection Time: 12/04/17  3:10 PM  Result Value Ref Range Status   Specimen Description BLOOD RIGHT WRIST  Final   Special Requests   Final    BOTTLES DRAWN AEROBIC ONLY Blood Culture results may not be optimal due to an inadequate volume of blood received in culture bottles   Culture NO GROWTH < 24 HOURS  Final   Report Status PENDING  Incomplete  Blood culture (routine x 2)     Status: None (Preliminary result)   Collection Time: 12/04/17  3:10 PM  Result Value Ref Range Status   Specimen Description BLOOD RIGHT FOREARM  Final   Special Requests   Final    BOTTLES DRAWN AEROBIC ONLY Blood Culture results may not be optimal due to an inadequate volume of blood received in culture bottles   Culture NO GROWTH < 24 HOURS  Final   Report Status PENDING  Incomplete    Radiology Reports Dg Chest 2 View  Result Date: 12/04/2017 CLINICAL DATA:  Altered mental status EXAM: CHEST  2 VIEW COMPARISON:  07/12/2017 chest radiograph. FINDINGS: Stable configuration of 2 lead left subclavian pacemaker. Stable cardiomediastinal silhouette with mild cardiomegaly and aortic atherosclerosis. No pneumothorax. No pleural effusion. Stable mild scarring versus atelectasis at the left lung base. No  overt pulmonary edema. No acute consolidative airspace disease. Vertebral compression fractures in the lower thoracic/ upper lumbar spine are not appreciably changed since 09/09/2016 CT. IMPRESSION: 1. Stable cardiomegaly without overt pulmonary edema. 2. Stable mild scarring versus atelectasis at the left lung base. Electronically Signed   By: Delbert Phenix M.D.   On: 12/04/2017 13:35   Dg Pelvis 1-2 Views  Result Date: 12/04/2017 CLINICAL DATA:  84 year old female status post fall today trying to get out of bed. EXAM: PELVIS - 1-2 VIEW COMPARISON:  CT Abdomen and Pelvis 09/09/2016 and pelvis radiograph 08/04/2011. FINDINGS: Portable AP supine view at 1828 hours. Femoral heads are normally located. Grossly intact proximal femurs. The pelvis appears stable since 2012. Chronic Aortoiliac calcified atherosclerosis. Chronic dystrophic soft tissue calcifications about both flanks. Negative visible bowel gas pattern. IMPRESSION: 1. No acute fracture identified about the pelvis. If there is lateralizing hip pain, a dedicated hip series is recommended. 2.  Aortic Atherosclerosis (ICD10-I70.0). Electronically Signed   By: Odessa Fleming M.D.   On: 12/04/2017 18:49   Dg Forearm Right  Result Date: 12/04/2017 CLINICAL DATA:  Arm caught in mid railing.  Pain. EXAM: RIGHT FOREARM - 2 VIEW COMPARISON:  None. FINDINGS: Old healed ulnar shaft fracture. No sign of acute injury. Regional vascular calcification. IMPRESSION: No acute finding. Electronically Signed   By: Paulina Fusi M.D.   On: 12/04/2017 13:37   Ct Head Wo  Contrast  Result Date: 12/04/2017 CLINICAL DATA:  84 year old with altered level of consciousness. EXAM: CT HEAD WITHOUT CONTRAST TECHNIQUE: Contiguous axial images were obtained from the base of the skull through the vertex without intravenous contrast. COMPARISON:  09/12/2017 FINDINGS: Brain: Stable diffuse cerebral atrophy. Again noted is low-density in the subcortical white matter particularly in the anterior  right parietal lobe. No evidence for acute hemorrhage, mass lesion, midline shift, hydrocephalus or large infarct. Vascular: No hyperdense vessel or unexpected calcification. Skull: Normal. Negative for fracture or focal lesion. Sinuses/Orbits: Chronic changes and mucosal disease in the left maxillary sinus. Mucosal disease in the ethmoid air cells. Other: None. IMPRESSION: No acute intracranial abnormality. Stable atrophy and evidence for chronic small vessel ischemic changes. Electronically Signed   By: Richarda Overlie M.D.   On: 12/04/2017 13:47   Dg Humerus Right  Result Date: 12/04/2017 CLINICAL DATA:  All arm call out and bad railing.  Pain. EXAM: RIGHT HUMERUS - 2+ VIEW COMPARISON:  None. FINDINGS: There is no evidence of fracture or other focal bone lesions. Soft tissues are unremarkable. IMPRESSION: Negative. Electronically Signed   By: Paulina Fusi M.D.   On: 12/04/2017 13:36    Time Spent in minutes  25   Noami Bove M.D on 12/05/2017 at 11:46 AM  Between 7am to 7pm - Pager - [phone]  After 7pm go to [email or web address] - password [facility]  [facility] -  Office  [phone]

## 2017-12-05 NOTE — Progress Notes (Signed)
Report from telemetry has been obained, pt in afib with bbb and HR in the 90s. Tele stating no new change from previous readings.

## 2017-12-05 NOTE — Progress Notes (Signed)
ANTICOAGULATION CONSULT NOTE  Pharmacy Consult for Warfarin Indication: atrial fibrillation  Allergies  Allergen Reactions  . Contrast Media [Iodinated Diagnostic Agents] Other (See Comments)    "shaking"  . Penicillins Other (See Comments)    Has patient had a PCN reaction causing immediate rash, facial/tongue/throat swelling, SOB or lightheadedness with hypotension: Unknown Has patient had a PCN reaction causing severe rash involving mucus membranes or skin necrosis: Unknown Has patient had a PCN reaction that required hospitalization: Unknown Has patient had a PCN reaction occurring within the last 10 years: Unknown If all of the above answers are "NO", then may proceed with Cephalosporin use.  . Sulfa Antibiotics    Patient Measurements: Height: 5\' 6"  (167.6 cm) Weight: 155 lb 10.3 oz (70.6 kg) IBW/kg (Calculated) : 59.3 Heparin Dosing Weight:   Vital Signs: Temp: 98.4 F (36.9 C) (01/08 0626) Temp Source: Oral (01/08 0626) BP: 132/58 (01/08 0626) Pulse Rate: 79 (01/08 0626)  Labs: Recent Labs    12/04/17 1333 12/04/17 1754 12/05/17 0624  HGB 13.2  --   --   HCT 42.3  --   --   PLT 162  --   --   LABPROT  --  22.3* 27.3*  INR  --  1.98 2.56  CREATININE 0.94  --   --   CKTOTAL 1,083*  --  801*    Estimated Creatinine Clearance: 38.7 mL/min (by C-G formula based on SCr of 0.94 mg/dL).  Medical History: Past Medical History:  Diagnosis Date  . Acid reflux   . Atrial fibrillation (HCC)   . Atrophic vaginitis   . Chronic cystitis   . Decubitus ulcer of buttock   . Dementia   . Hyperlipidemia   . Hypertension   . Incomplete bladder emptying   . Microscopic hematuria   . Pacemaker   . Straining on urination   . Symptomatic bradycardia   . Urge incontinence of urine   . UTI (lower urinary tract infection)    Medications:  Medications Prior to Admission  Medication Sig Dispense Refill Last Dose  . cephALEXin (KEFLEX) 250 MG capsule Take 1 capsule by  mouth daily.   Past Week at Unknown time  . ciprofloxacin (CIPRO) 250 MG tablet Take 1 tablet (250 mg total) by mouth 2 (two) times daily. 10 tablet 0 Past Week at Unknown time  . furosemide (LASIX) 40 MG tablet Take 40 mg by mouth daily.    Past Week at Unknown time  . metoprolol succinate (TOPROL-XL) 25 MG 24 hr tablet TAKE 1 AND 1/2 TABLETS BY MOUTH ONCE DAILY. 135 tablet 0 Past Week at Unknown time  . Multiple Vitamins-Minerals (CENTRUM SILVER PO) Take 1 tablet by mouth daily.     Past Week at Unknown time  . omeprazole (PRILOSEC) 20 MG capsule Take 20 mg by mouth daily.     Past Week at Unknown time  . potassium chloride SA (K-DUR,KLOR-CON) 20 MEQ tablet Take 1 tablet by mouth daily.   Past Week at Unknown time  . simvastatin (ZOCOR) 10 MG tablet Take 10 mg by mouth daily.   Past Week at Unknown time  . Travoprost, BAK Free, (TRAVATAN Z) 0.004 % SOLN ophthalmic solution Place 1 drop into both eyes at bedtime.   Past Week at Unknown time  . warfarin (COUMADIN) 3 MG tablet TAKE 1 TO 1 AND 1/2 TABLET BY MOUTH DAILY AS DIRECTED. (Patient taking differently: TAKE 1 TABLET BY MOUTH DAILY except takes 1/2 tablet (1.5mg ) on Mondays.Marland Kitchen) 120 tablet  8 Past Week at Unknown time   Assessment: INR has rising quickly from 1.9 > 2.5.  INR therapeutic.   No bleeding noted.  Goal of Therapy:  INR 2-3   Plan:  Warfarin 1mg  PO x 1 Daily PT/INR Monitor for signs and symptoms of bleeding.   Margo AyeHall, Sanii Kukla A 12/05/2017,10:07 AM

## 2017-12-05 NOTE — Evaluation (Signed)
Physical Therapy Evaluation Patient Details Name: [name] MRN: [id number] DOB: 16-May-1927 Today's Date: 12/05/2017   History of Present Illness  [name] is a 84 y.o. female with medical history significant for atrial fibrillation on anticoagulation, pacemaker status, mitral insufficiency.  History obtained from patient's daughter, due to patient's mental status. Patient was brought to the ED via EMS after the patient was found by her daughter this morning.  Patient was found with her arm caught between the metal railing of her bed and the bed, sitting on the floor, with her back against the bed, for an unknown period of time. (Daughter [name], has a picture on her phone).  Patient was last seen yesterday evening at 9 PM.     Clinical Impression  Patient very apprehensive to get due to fear of falling, patient daughter present and very helpful to encourage patient to participate, patient demonstrates slow labored movement and require assist to to grip walker with right hand due to weakness/pain, limited to a few steps to transfer to chair and tolerated sitting up after therapy with her daughter and RN supervising.  Patient will benefit from continued physical therapy in hospital and recommended venue below to increase strength, balance, endurance for safe ADLs and gait.    Follow Up Recommendations SNF;Supervision/Assistance - 24 hour    Equipment Recommendations  None recommended by PT    Recommendations for Other Services       Precautions / Restrictions Precautions Precautions: Fall Restrictions Weight Bearing Restrictions: No      Mobility  Bed Mobility Overal bed mobility: Needs Assistance Bed Mobility: Supine to Sit     Supine to sit: Mod assist     General bed mobility comments: demonstrates slow labored movement  Transfers Overall transfer level: Needs assistance Equipment used: Rolling walker (2 wheeled) Transfers: Sit to/from [employer] Sit to Stand: Mod assist Stand pivot transfers: Mod assist       General transfer comment: very slow labored movement, unable to grip walker with right hand due to weakness (required therapist hold patient's right hand onto walker)  Ambulation/Gait Ambulation/Gait assistance: Mod assist;Max assist Ambulation Distance (Feet): 4 Feet Assistive device: Rolling walker (2 wheeled) Gait Pattern/deviations: Decreased step length - right;Decreased step length - left;Decreased stride length   Gait velocity interpretation: Below normal speed for age/gender General Gait Details: Patient limited to 5-6 steps at bedside due to weakness and poor right hand grip strength  Stairs            Wheelchair Mobility    Modified Rankin (Stroke Patients Only)       Balance Overall balance assessment: Needs assistance Sitting-balance support: No upper extremity supported;Feet supported Sitting balance-Leahy Scale: Good     Standing balance support: Bilateral upper extremity supported;During functional activity Standing balance-Leahy Scale: Poor                               Pertinent Vitals/Pain Pain Assessment: Faces Faces Pain Scale: Hurts even more Pain Location: RUE Pain Descriptors / Indicators: Aching;Tender;Sore Pain Intervention(s): Limited activity within patient's tolerance;Monitored during session    Home Living Family/patient expects to be discharged to:: Private residence Living Arrangements: Children Available Help at Discharge: Personal care attendant(has home aides from 12:30 - 4:30 Monday - Friday) Type of Home: House Home Access: Stairs to enter Entrance Stairs-Rails: None Entrance Stairs-Number of Steps: 1 Home Layout: One level Home Equipment: Walker - 4  wheels;Hand held shower head;Shower seat - built in      Prior Function Level of Independence: Needs assistance   Gait / Transfers Assistance Needed: Supervised for household ambulation  using RW  ADL's / Homemaking Assistance Needed: Assisted by her daughter and caregivers 4 hours/day x 5 days/week, daughter visits at least 2x/daily and spend more time over the weekend        Hand Dominance        Extremity/Trunk Assessment   Upper Extremity Assessment Upper Extremity Assessment: Generalized weakness;RUE deficits/detail;LUE deficits/detail RUE Deficits / Details: grossly -3/5 except hand grip grossly -2/5 LUE Deficits / Details: grossly 3+/5    Lower Extremity Assessment Lower Extremity Assessment: Generalized weakness    Cervical / Trunk Assessment Cervical / Trunk Assessment: Normal  Communication   Communication: No difficulties  Cognition Arousal/Alertness: Awake/alert Behavior During Therapy: WFL for tasks assessed/performed Overall Cognitive Status: History of cognitive impairments - at baseline                                 General Comments: requires much encouragement to participate due to apprehension      General Comments      Exercises     Assessment/Plan    PT Assessment Patient needs continued PT services  PT Problem List Decreased strength;Decreased activity tolerance;Decreased balance;Decreased mobility       PT Treatment Interventions Gait training;Functional mobility training;Therapeutic activities;Therapeutic exercise;Patient/family education    PT Goals (Current goals can be found in the Care Plan section)  Acute Rehab PT Goals Patient Stated Goal: return home after rehab Time For Goal Achievement: 12/19/17 Potential to Achieve Goals: Good    Frequency Min 3X/week   Barriers to discharge        Co-evaluation               AM-PAC PT "6 Clicks" Daily Activity  Outcome Measure Difficulty turning over in bed (including adjusting bedclothes, sheets and blankets)?: A Lot Difficulty moving from lying on back to sitting on the side of the bed? : A Lot Difficulty sitting down on and standing up from  a chair with arms (e.g., wheelchair, bedside commode, etc,.)?: A Lot Help needed moving to and from a bed to chair (including a wheelchair)?: A Lot Help needed walking in hospital room?: A Lot Help needed climbing 3-5 steps with a railing? : Total 6 Click Score: 11    End of Session   Activity Tolerance: Patient limited by fatigue;Patient limited by pain Patient left: in chair;with call bell/phone within reach;with family/visitor present Nurse Communication: Mobility status PT Visit Diagnosis: Unsteadiness on feet (R26.81);Other abnormalities of gait and mobility (R26.89);Muscle weakness (generalized) (M62.81)    Time: 1010-1040 PT Time Calculation (min) (ACUTE ONLY): 30 min   Charges:   PT Evaluation $PT Eval Moderate Complexity: 1 Mod PT Treatments $Therapeutic Activity: 23-37 mins   PT G Codes:        2:14 PM, 12/05/17 Ocie BobJames Kielan Dreisbach, MPT Physical Therapist [facility] [phone] office [phone] mobile phone

## 2017-12-05 NOTE — Plan of Care (Signed)
  Acute Rehab PT Goals(only PT should resolve) Pt Will Go Supine/Side To Sit 12/05/2017 1418 - Progressing by Ocie BobWatkins, Wyoma Genson, PT Flowsheets Taken 12/05/2017 1418  Pt will go Supine/Side to Sit with minimal assist Patient Will Transfer Sit To/From Stand 12/05/2017 1418 - Progressing by Ocie BobWatkins, Lennie Vasco, PT Flowsheets Taken 12/05/2017 1418  Patient will transfer sit to/from stand with minimal assist Pt Will Transfer Bed To Chair/Chair To Bed 12/05/2017 1418 - Progressing by Ocie BobWatkins, Amry Cathy, PT Flowsheets Taken 12/05/2017 1418  Pt will Transfer Bed to Chair/Chair to Bed with min assist Pt Will Ambulate 12/05/2017 1418 - Progressing by Ocie BobWatkins, Clare Casto, PT Flowsheets Taken 12/05/2017 1418  Pt will Ambulate with minimal assist;25 feet;with rolling walker  2:20 PM, 12/05/17 Ocie BobJames Dervin Vore, MPT Physical Therapist [facility] 336 [phone] office [phone] mobile phone

## 2017-12-06 LAB — BASIC METABOLIC PANEL WITH GFR
Anion gap: 8 (ref 5–15)
BUN: 15 mg/dL (ref 6–20)
CO2: 20 mmol/L — ABNORMAL LOW (ref 22–32)
Calcium: 8.2 mg/dL — ABNORMAL LOW (ref 8.9–10.3)
Chloride: 105 mmol/L (ref 101–111)
Creatinine, Ser: 0.77 mg/dL (ref 0.44–1.00)
GFR calc Af Amer: >60 mL/min
GFR calc non Af Amer: >60 mL/min
Glucose, Bld: 120 mg/dL — ABNORMAL HIGH (ref 65–99)
Potassium: 4 mmol/L (ref 3.5–5.1)
Sodium: 133 mmol/L — ABNORMAL LOW (ref 135–145)

## 2017-12-06 LAB — PROTIME-INR
INR: 2.42
Prothrombin Time: 26.2 seconds — ABNORMAL HIGH (ref 11.4–15.2)

## 2017-12-06 LAB — CK: Total CK: 972 U/L — ABNORMAL HIGH (ref 38–234)

## 2017-12-06 MED ORDER — WARFARIN SODIUM 2 MG PO TABS
2.0000 mg | ORAL_TABLET | Freq: Once | ORAL | Status: AC
  Administered 2017-12-06: 2 mg via ORAL
  Filled 2017-12-06: qty 1

## 2017-12-06 MED ORDER — SODIUM CHLORIDE 0.9 % IV SOLN
INTRAVENOUS | Status: DC
  Administered 2017-12-06 – 2017-12-07 (×2): via INTRAVENOUS

## 2017-12-06 NOTE — Progress Notes (Signed)
PROGRESS NOTE                                                                                                                                                                                                             Patient Demographics:    [name], is a 84 y.o. female, DOB - 1927-05-13, HQI:[id number]  Admit date - 12/04/2017   Admitting Physician Courage Mariea Clonts, MD  Outpatient Primary MD for the patient is Avon Gully, MD  LOS - 2  Outpatient Specialists: None  Chief Complaint  Patient presents with  . Altered Mental Status       Brief Narrative  84 year old female who lives alone in her apartment with history of A. fib on anti-evaluation, this make her status, and mitral insufficiency brought to the ED by EMS after she was found on the side of the bed by her daughter for on known duration with her arm caught between the metal railing of her bed. The trailing had to be cut to let her on low-dose. She was last seen the previous night (over 12 hours back). Per daughter patient has slow progressive confusion over past several months but for the past 5 days symptoms have worsened. She was seen by her PCP recently who thought patient did not have any infection or UTI but the daughter was still giving patient some ciprofloxacin. Denied any fevers, chills, nausea, vomiting, diarrhea, shortness of breath, chest pain or abdominal pain. At baseline patient ambulates with a walker, has slow progressive confusion but is able to carry out some conversation.  In the ED patient was tachycardic to 130s, elevated blood pressure. Blood work showed UA suggestive of UTI and elevated CK of 1000. CT head, chest x-ray negative for acute findings. X-ray of the right humerus and forearm negative for fracture. Culture sent and patient started on IV Rocephin. Hospitalist consulted for admission for UTI with acute metabolic encephalopathy and  rhabdomyolysis.   Subjective:   Was seen and examined this morning accompanied by her husband.  Awake alert oriented able to answer all my questions, still complaining some abdominal discomfort.  Constipation. Was able to participate in exam. No issues overnight. Husband reports that she still less talkative, less more sleepy.  This is not her baseline.   Assessment  & Plan :    Principal Problem: Acute metabolic  encephalopathy - acute neurological findings, able to move her upper extremities with no asymmetric deficiency, negative any facial asymmetry, speech intact rather slow, cognition intact, able to follow commands   possibly due to UTI. Follow urine culture, blood culture. Continue empiric Rocephin. Mentation improvin.  Continue IV hydration.  Active Problems: Rhabdomyolysis  secondary to fall. CPK trending down mildly trended upward today we will continue IV fluid hydration Monitoring mostly.  Continue to hold her her simvastatin  Atrial fibrillation [facility](HCC)-  Pacemaker status Rate controlled currently.  Continue Coumadin with dosing per pharmacy.  Continue metoprolol.  Generalized weakness -for falls, PT/OT consulted for evaluation recommendation Highly recommending SNF placement     Code Status : Full code Family Communication  : Daughter at bedside Disposition Plan  : Pending clinical improvement and PT evaluation. Possibly in the next 1-2 days Barriers For Discharge : Active symptoms Consults  :  None  Procedures  : CT head  DVT Prophylaxis  :  Coumadin  Lab Results  Component Value Date   PLT 162 12/04/2017    Antibiotics  :    Anti-infectives (From admission, onward)   Start     Dose/Rate Route Frequency Ordered Stop   12/05/17 1500  cefTRIAXone (ROCEPHIN) 1 g in dextrose 5 % 50 mL IVPB     1 g 100 mL/hr over 30 Minutes Intravenous Every 24 hours 12/04/17 2014     12/04/17 1445  cefTRIAXone (ROCEPHIN) 1 g in dextrose 5 % 50 mL IVPB     1 g 100  mL/hr over 30 Minutes Intravenous  Once 12/04/17 1435 12/04/17 1540        Objective:   Vitals:   12/05/17 1800 12/05/17 2129 12/06/17 0616 12/06/17 0901  BP: 140/61 132/60 128/64 (!) 142/65  Pulse: 92 90 94 81  Resp:  16 16   Temp: 98.8 F (37.1 C) 98.8 F (37.1 C) 98.4 F (36.9 C)   TempSrc: Oral Oral Oral   SpO2: 100% 100% 100%   Weight:   67.7 kg (149 lb 4 oz)   Height:        Wt Readings from Last 3 Encounters:  12/06/17 67.7 kg (149 lb 4 oz)  09/12/17 74.4 kg (164 lb)  07/12/17 73.9 kg (163 lb)     Intake/Output Summary (Last 24 hours) at 12/06/2017 1448 Last data filed at 12/06/2017 1300 Gross per 24 hour  Intake 1631.67 ml  Output 1150 ml  Net 481.67 ml     Physical Exam  Gen: not in distress HEENT: no pallor, moist mucosa, supple neck Chest: clear b/l, no added sounds CVS: S1 and S2 irregular, no murmurs rub or gallop GI: soft, NT, ND, BS+ Musculoskeletal: warm, chronic right knee swelling , no edema, bruising over right forearm CNS: AAOX1-2, nonfocal    Data Review:    CBC Recent Labs  Lab 12/04/17 1333  WBC 10.1  HGB 13.2  HCT 42.3  PLT 162  MCV 94.4  MCH 29.5  MCHC 31.2  RDW 14.5  LYMPHSABS 1.1  MONOABS 0.9  EOSABS 0.0  BASOSABS 0.0    Chemistries  Recent Labs  Lab 12/04/17 1333 12/06/17 0625  NA 138 133*  K 4.6 4.0  CL 106 105  CO2 21* 20*  GLUCOSE 129* 120*  BUN 19 15  CREATININE 0.94 0.77  CALCIUM 8.3* 8.2*  AST 39  --   ALT 15  --   ALKPHOS 63  --   BILITOT 0.5  --    ------------------------------------------------------------------------------------------------------------------  No results for input(s): CHOL, HDL, LDLCALC, TRIG, CHOLHDL, LDLDIRECT in the last 72 hours.  No results found for: HGBA1C ------------------------------------------------------------------------------------------------------------------ No results for input(s): TSH, T4TOTAL, T3FREE, THYROIDAB in the last 72 hours.  Invalid input(s):  FREET3 ------------------------------------------------------------------------------------------------------------------ No results for input(s): VITAMINB12, FOLATE, FERRITIN, TIBC, IRON, RETICCTPCT in the last 72 hours.  Coagulation profile Recent Labs  Lab 12/04/17 1754 12/05/17 0624 12/06/17 0625  INR 1.98 2.56 2.42    No results for input(s): DDIMER in the last 72 hours.  Cardiac Enzymes No results for input(s): CKMB, TROPONINI, MYOGLOBIN in the last 168 hours.  Invalid input(s): CK ------------------------------------------------------------------------------------------------------------------ No results found for: BNP  Inpatient Medications  Scheduled Meds: . mouth rinse  15 mL Mouth Rinse BID  . metoprolol succinate  37.5 mg Oral Daily  . pantoprazole  40 mg Oral Daily  . warfarin  2 mg Oral Once  . Warfarin - Pharmacist Dosing Inpatient   Does not apply Q24H   Continuous Infusions: . cefTRIAXone (ROCEPHIN)  IV Stopped (12/05/17 1615)   PRN Meds:.  Micro Results Recent Results (from the past 240 hour(s))  Blood culture (routine x 2)     Status: None (Preliminary result)   Collection Time: 12/04/17  3:10 PM  Result Value Ref Range Status   Specimen Description BLOOD RIGHT WRIST  Final   Special Requests   Final    BOTTLES DRAWN AEROBIC ONLY Blood Culture results may not be optimal due to an inadequate volume of blood received in culture bottles   Culture NO GROWTH 2 DAYS  Final   Report Status PENDING  Incomplete  Blood culture (routine x 2)     Status: None (Preliminary result)   Collection Time: 12/04/17  3:10 PM  Result Value Ref Range Status   Specimen Description BLOOD RIGHT FOREARM  Final   Special Requests   Final    BOTTLES DRAWN AEROBIC ONLY Blood Culture results may not be optimal due to an inadequate volume of blood received in culture bottles   Culture NO GROWTH 2 DAYS  Final   Report Status PENDING  Incomplete    Radiology Reports Dg Chest  2 View  Result Date: 12/04/2017 CLINICAL DATA:  Altered mental status EXAM: CHEST  2 VIEW COMPARISON:  07/12/2017 chest radiograph. FINDINGS: Stable configuration of 2 lead left subclavian pacemaker. Stable cardiomediastinal silhouette with mild cardiomegaly and aortic atherosclerosis. No pneumothorax. No pleural effusion. Stable mild scarring versus atelectasis at the left lung base. No overt pulmonary edema. No acute consolidative airspace disease. Vertebral compression fractures in the lower thoracic/ upper lumbar spine are not appreciably changed since 09/09/2016 CT. IMPRESSION: 1. Stable cardiomegaly without overt pulmonary edema. 2. Stable mild scarring versus atelectasis at the left lung base. Electronically Signed   By: Delbert Phenix M.D.   On: 12/04/2017 13:35   Dg Pelvis 1-2 Views  Result Date: 12/04/2017 CLINICAL DATA:  84 year old female status post fall today trying to get out of bed. EXAM: PELVIS - 1-2 VIEW COMPARISON:  CT Abdomen and Pelvis 09/09/2016 and pelvis radiograph 08/04/2011. FINDINGS: Portable AP supine view at 1828 hours. Femoral heads are normally located. Grossly intact proximal femurs. The pelvis appears stable since 2012. Chronic Aortoiliac calcified atherosclerosis. Chronic dystrophic soft tissue calcifications about both flanks. Negative visible bowel gas pattern. IMPRESSION: 1. No acute fracture identified about the pelvis. If there is lateralizing hip pain, a dedicated hip series is recommended. 2.  Aortic Atherosclerosis (ICD10-I70.0). Electronically Signed   By: Althea Grimmer.D.  On: 12/04/2017 18:49   Dg Forearm Right  Result Date: 12/04/2017 CLINICAL DATA:  Arm caught in mid railing.  Pain. EXAM: RIGHT FOREARM - 2 VIEW COMPARISON:  None. FINDINGS: Old healed ulnar shaft fracture. No sign of acute injury. Regional vascular calcification. IMPRESSION: No acute finding. Electronically Signed   By: Paulina Fusi M.D.   On: 12/04/2017 13:37   Ct Head Wo Contrast  Result Date:  12/04/2017 CLINICAL DATA:  84 year old with altered level of consciousness. EXAM: CT HEAD WITHOUT CONTRAST TECHNIQUE: Contiguous axial images were obtained from the base of the skull through the vertex without intravenous contrast. COMPARISON:  09/12/2017 FINDINGS: Brain: Stable diffuse cerebral atrophy. Again noted is low-density in the subcortical white matter particularly in the anterior right parietal lobe. No evidence for acute hemorrhage, mass lesion, midline shift, hydrocephalus or large infarct. Vascular: No hyperdense vessel or unexpected calcification. Skull: Normal. Negative for fracture or focal lesion. Sinuses/Orbits: Chronic changes and mucosal disease in the left maxillary sinus. Mucosal disease in the ethmoid air cells. Other: None. IMPRESSION: No acute intracranial abnormality. Stable atrophy and evidence for chronic small vessel ischemic changes. Electronically Signed   By: Richarda Overlie M.D.   On: 12/04/2017 13:47   Dg Humerus Right  Result Date: 12/04/2017 CLINICAL DATA:  All arm call out and bad railing.  Pain. EXAM: RIGHT HUMERUS - 2+ VIEW COMPARISON:  None. FINDINGS: There is no evidence of fracture or other focal bone lesions. Soft tissues are unremarkable. IMPRESSION: Negative. Electronically Signed   By: Paulina Fusi M.D.   On: 12/04/2017 13:36    Time Spent in minutes  25   Kendell Bane M.D on 12/06/2017 at 2:48 PM Between 7am to 7pm - Pager - [phone] After 7pm go to [email or web address] - password [facility] -  Office  [phone]

## 2017-12-06 NOTE — Progress Notes (Signed)
ANTICOAGULATION CONSULT NOTE  Pharmacy Consult for Warfarin Indication: atrial fibrillation  Allergies  Allergen Reactions  . Contrast Media [Iodinated Diagnostic Agents] Other (See Comments)    "shaking"  . Penicillins Other (See Comments)    Has patient had a PCN reaction causing immediate rash, facial/tongue/throat swelling, SOB or lightheadedness with hypotension: Unknown Has patient had a PCN reaction causing severe rash involving mucus membranes or skin necrosis: Unknown Has patient had a PCN reaction that required hospitalization: Unknown Has patient had a PCN reaction occurring within the last 10 years: Unknown If all of the above answers are "NO", then may proceed with Cephalosporin use.  . Sulfa Antibiotics    Patient Measurements: Height: 5\' 6"  (167.6 cm) Weight: 149 lb 4 oz (67.7 kg) IBW/kg (Calculated) : 59.3 Heparin Dosing Weight:   Vital Signs: Temp: 98.4 F (36.9 C) (01/09 0616) Temp Source: Oral (01/09 0616) BP: 142/65 (01/09 0901) Pulse Rate: 81 (01/09 0901)  Labs: Recent Labs    12/04/17 1333 12/04/17 1754 12/05/17 0624 12/06/17 0625  HGB 13.2  --   --   --   HCT 42.3  --   --   --   PLT 162  --   --   --   LABPROT  --  22.3* 27.3* 26.2*  INR  --  1.98 2.56 2.42  CREATININE 0.94  --   --  0.77  CKTOTAL 1,083*  --  801* 972*   Estimated Creatinine Clearance: 45.5 mL/min (by C-G formula based on SCr of 0.77 mg/dL).  Medical History: Past Medical History:  Diagnosis Date  . Acid reflux   . Atrial fibrillation (HCC)   . Atrophic vaginitis   . Chronic cystitis   . Decubitus ulcer of buttock   . Dementia   . Hyperlipidemia   . Hypertension   . Incomplete bladder emptying   . Microscopic hematuria   . Pacemaker   . Straining on urination   . Symptomatic bradycardia   . Urge incontinence of urine   . UTI (lower urinary tract infection)    Medications:  Medications Prior to Admission  Medication Sig Dispense Refill Last Dose  . cephALEXin  (KEFLEX) 250 MG capsule Take 1 capsule by mouth daily.   Past Week at Unknown time  . ciprofloxacin (CIPRO) 250 MG tablet Take 1 tablet (250 mg total) by mouth 2 (two) times daily. 10 tablet 0 Past Week at Unknown time  . furosemide (LASIX) 40 MG tablet Take 40 mg by mouth daily.    Past Week at Unknown time  . metoprolol succinate (TOPROL-XL) 25 MG 24 hr tablet TAKE 1 AND 1/2 TABLETS BY MOUTH ONCE DAILY. 135 tablet 0 Past Week at Unknown time  . Multiple Vitamins-Minerals (CENTRUM SILVER PO) Take 1 tablet by mouth daily.     Past Week at Unknown time  . omeprazole (PRILOSEC) 20 MG capsule Take 20 mg by mouth daily.     Past Week at Unknown time  . potassium chloride SA (K-DUR,KLOR-CON) 20 MEQ tablet Take 1 tablet by mouth daily.   Past Week at Unknown time  . simvastatin (ZOCOR) 10 MG tablet Take 10 mg by mouth daily.   Past Week at Unknown time  . Travoprost, BAK Free, (TRAVATAN Z) 0.004 % SOLN ophthalmic solution Place 1 drop into both eyes at bedtime.   Past Week at Unknown time  . warfarin (COUMADIN) 3 MG tablet TAKE 1 TO 1 AND 1/2 TABLET BY MOUTH DAILY AS DIRECTED. (Patient taking differently: TAKE  1 TABLET BY MOUTH DAILY except takes 1/2 tablet (1.5mg ) on Mondays.Marland Kitchen) 120 tablet 8 Past Week at Unknown time   Assessment: INR is now therapeutic.   No bleeding noted.  Goal of Therapy:  INR 2-3   Plan:  Warfarin 2mg  PO x 1 Daily PT/INR Monitor for signs and symptoms of bleeding.   Valrie Hart A 12/06/2017,12:13 PM

## 2017-12-06 NOTE — Clinical Social Work Note (Signed)
Clinical Social Work Assessment  Patient Details  Name: [name] MRN: [id number] Date of Birth: [birth year]  Date of referral:  12/06/17               Reason for consult:  Facility Placement                Permission sought to share information with:  Oceanographeracility Contact Representative Permission granted to share information::  Yes, Verbal Permission Granted  Name::        Agency::  [facility]  Relationship::     Contact Information:     Housing/Transportation Living arrangements for the past 2 months:  Apartment Source of Information:  Adult Children Patient Interpreter Needed:  None Criminal Activity/Legal Involvement Pertinent to Current Situation/Hospitalization:  No - Comment as needed Significant Relationships:  Adult Children Lives with:  Self Do you feel safe going back to the place where you live?  Yes Need for family participation in patient care:  Yes (Comment)  Care giving concerns: Pt lives alone and will need short term SNF rehab.   Social Worker assessment / plan: Pt is an 84 year old female. Received CSW consult for SNF rehab placement. Pt only oriented to self due to her metabolic encephalopathy. Spoke with pt's daughter, Marylu LundJanet, by phone. Pt [place] apartment. Pt uses a walker for ambulation at home. She has caregivers for several hours a day Monday-Friday. Discussed SNF options with pt's daughter who [facility] Will follow up with pt's daughter tomorrow to further assist with dc planning.  Employment status:  Retired Database administratornsurance information:  Managed Medicare PT Recommendations:  Skilled Nursing Facility Information / Referral to community resources:  Skilled Nursing Facility  Patient/Family's Response to care: Family accepting of care.  Patient/Family's Understanding of and Emotional Response to Diagnosis, Current Treatment, and Prognosis: Pt unable to understand diagnosis and treatment plan at this time. Pt's  daughter appears to have good understanding and she does not appear emotionally distressed.  Emotional Assessment Appearance:  Appears stated age Attitude/Demeanor/Rapport:    Affect (typically observed):  Calm Orientation:  Oriented to Self Alcohol / Substance use:  Not Applicable Psych involvement (Current and /or in the community):  No (Comment)  Discharge Needs  Concerns to be addressed:  Discharge Planning Concerns Readmission within the last 30 days:  No Current discharge risk:  Lives alone Barriers to Discharge:  No Barriers Identified   Norma GaultKathleen Meila Berke, LCSW 12/06/2017, 12:29 PM

## 2017-12-06 NOTE — NC FL2 (Signed)
  [place] MEDICAID FL2 LEVEL OF CARE SCREENING TOOL     IDENTIFICATION  Patient Name: Larene Picketthelma D Mwangi Birthdate: Apr 16, 1927 Sex: female Admission Date (Current Location): 12/04/2017  [facility] [place]   Facility and Address:  [facility],  [address], [place] [id number]      Provider Number: [id number]  Attending Physician Name and Address:  Kendell BaneShahmehdi, Seyed A, MD  Relative Name and Phone Number:  Junious DresserJanet Sinclair [phone]  [phone]    Current Level of Care: Hospital Recommended Level of Care: Skilled Nursing Facility Prior Approval Number:    Date Approved/Denied:   PASRR Number: [id number] [id number]  Discharge Plan: SNF    Current Diagnoses: Patient Active Problem List   Diagnosis Date Noted  . Pressure injury of skin 12/05/2017  . Metabolic encephalopathy 12/04/2017  . Hyperlipidemia 11/14/2013  . Mitral insufficiency 11/14/2013  . Symptomatic bradycardia 07/05/2013  . Pacemaker - Medtronic 2007, new generator 2014 07/05/2013  . Atrial fibrillation (HCC) 03/15/2013  . Long term current use of anticoagulant therapy 03/15/2013    Orientation RESPIRATION BLADDER Height & Weight     Self  Normal External catheter Weight: 149 lb 4 oz (67.7 kg) Height:  5\' 6"  (167.6 cm)  BEHAVIORAL SYMPTOMS/MOOD NEUROLOGICAL BOWEL NUTRITION STATUS      Continent Diet(Regular)  AMBULATORY STATUS COMMUNICATION OF NEEDS Skin   Extensive Assist Verbally PU Stage and Appropriate Care                       Personal Care Assistance Level of Assistance  Bathing, Feeding, Dressing Bathing Assistance: Limited assistance Feeding assistance: Independent Dressing Assistance: Limited assistance     Functional Limitations Info  Sight, Hearing, Speech Sight Info: Adequate Hearing Info: Adequate Speech Info: Adequate    SPECIAL CARE FACTORS FREQUENCY  PT (By licensed PT)     PT Frequency: 5 times week              Contractures Contractures  Info: Not present    Additional Factors Info  Code Status, Allergies Code Status Info: full  Allergies Info: contrast media, penicillins, sulfa antibiotics,            Current Medications (12/06/2017):  This is the current hospital active medication list Current Facility-Administered Medications  Medication Dose Route Frequency Provider Last Rate Last Dose  . cefTRIAXone (ROCEPHIN) 1 g in dextrose 5 % 50 mL IVPB  1 g Intravenous Q24H Emokpae, Ejiroghene E, MD   Stopped at 12/05/17 1615  . MEDLINE mouth rinse  15 mL Mouth Rinse BID Emokpae, Ejiroghene E, MD   15 mL at 12/06/17 0902  . metoprolol succinate (TOPROL-XL) 24 hr tablet 37.5 mg  37.5 mg Oral Daily Emokpae, Ejiroghene E, MD   37.5 mg at 12/06/17 0901  . pantoprazole (PROTONIX) EC tablet 40 mg  40 mg Oral Daily Emokpae, Ejiroghene E, MD   40 mg at 12/06/17 0902  . warfarin (COUMADIN) tablet 2 mg  2 mg Oral Once Shahmehdi, Gemma PayorSeyed A, MD      . Warfarin - Pharmacist Dosing Inpatient   Does not apply Q24H Emokpae, Ejiroghene E, MD         Discharge Medications: Please see discharge summary for a list of discharge medications.  Relevant Imaging Results:  Relevant Lab Results:   Additional Information SSN: [id number]  [place], [place]

## 2017-12-06 NOTE — Progress Notes (Signed)
Physical Therapy Treatment Patient Details Name: [name] MRN: [id number] DOB: 08-03-1927 Today's Date: 12/06/2017    History of Present Illness [name] is a 84 y.o. female with medical history significant for atrial fibrillation on anticoagulation, pacemaker status, mitral insufficiency.  History obtained from patient's daughter, due to patient's mental status. Patient was brought to the ED via EMS after the patient was found by her daughter this morning.  Patient was found with her arm caught between the metal railing of her bed and the bed, sitting on the floor, with her back against the bed, for an unknown period of time. (Daughter [name], has a picture on her phone).  Patient was last seen yesterday evening at 9 PM.     PT Comments    Patient requires much encouragement to participate, tolerated sitting up at bedside for approximately 10 minutes with difficulty supporting right side due to right hand/wrist pain/weakness, declined to attempt sit to stands or transferring to chair due to c/o fatigue.  Patient will benefit from continued physical therapy in hospital and recommended venue below to increase strength, balance, endurance for safe ADLs and gait.   Follow Up Recommendations  SNF;Supervision/Assistance - 24 hour     Equipment Recommendations  None recommended by PT    Recommendations for Other Services       Precautions / Restrictions Precautions Precautions: Fall Restrictions Weight Bearing Restrictions: No    Mobility  Bed Mobility Overal bed mobility: Needs Assistance Bed Mobility: Supine to Sit     Supine to sit: Mod assist;Max assist     General bed mobility comments: required max encouragement to sit up at bedside  Transfers                    Ambulation/Gait                 Stairs            Wheelchair Mobility    Modified Rankin (Stroke Patients Only)       Balance Overall balance assessment: Needs  assistance Sitting-balance support: Bilateral upper extremity supported;Feet unsupported Sitting balance-Leahy Scale: Poor Sitting balance - Comments: leans mostly to the right Postural control: Right lateral lean                                  Cognition Arousal/Alertness: Awake/alert Behavior During Therapy: Agitated Overall Cognitive Status: History of cognitive impairments - at baseline                                 General Comments: requires much encouragement to participate due to apprehension      Exercises General Exercises - Lower Extremity Ankle Circles/Pumps: Supine;AROM;Strengthening;Both;10 reps    General Comments        Pertinent Vitals/Pain Pain Assessment: Faces Faces Pain Scale: Hurts even more Pain Location: RUE Pain Descriptors / Indicators: Aching;Tender;Sore Pain Intervention(s): Limited activity within patient's tolerance;Monitored during session    Home Living                      Prior Function            PT Goals (current goals can now be found in the care plan section) Acute Rehab PT Goals Patient Stated Goal: return home after rehab Time For Goal Achievement: 12/19/17 Potential to  Achieve Goals: Fair Progress towards PT goals: Progressing toward goals    Frequency    Min 3X/week      PT Plan Current plan remains appropriate    Co-evaluation              AM-PAC PT "6 Clicks" Daily Activity  Outcome Measure  Difficulty turning over in bed (including adjusting bedclothes, sheets and blankets)?: A Lot Difficulty moving from lying on back to sitting on the side of the bed? : A Lot Difficulty sitting down on and standing up from a chair with arms (e.g., wheelchair, bedside commode, etc,.)?: A Lot Help needed moving to and from a bed to chair (including a wheelchair)?: A Lot Help needed walking in hospital room?: A Lot Help needed climbing 3-5 steps with a railing? : Total 6 Click  Score: 11    End of Session   Activity Tolerance: Patient limited by fatigue;Treatment limited secondary to agitation Patient left: in bed;with call bell/phone within reach;with bed alarm set Nurse Communication: Mobility status PT Visit Diagnosis: Unsteadiness on feet (R26.81);Other abnormalities of gait and mobility (R26.89);Muscle weakness (generalized) (M62.81)     Time: [id number] PT Time Calculation (min) (ACUTE ONLY): 18 min  Charges:  $Therapeutic Activity: 8-22 mins                    G Codes:       3:23 PM, 12/06/17 Ocie BobJames Laneah Luft, MPT Physical Therapist [facility] [phone] office [phone] mobile phone

## 2017-12-07 DIAGNOSIS — N390 Urinary tract infection, site not specified: Secondary | ICD-10-CM

## 2017-12-07 DIAGNOSIS — I482 Chronic atrial fibrillation: Secondary | ICD-10-CM

## 2017-12-07 DIAGNOSIS — G9341 Metabolic encephalopathy: Principal | ICD-10-CM

## 2017-12-07 LAB — BASIC METABOLIC PANEL
ANION GAP: 8 (ref 5–15)
BUN: 10 mg/dL (ref 6–20)
CO2: 21 mmol/L — AB (ref 22–32)
Calcium: 8.3 mg/dL — ABNORMAL LOW (ref 8.9–10.3)
Chloride: 106 mmol/L (ref 101–111)
Creatinine, Ser: 0.63 mg/dL (ref 0.44–1.00)
GFR calc Af Amer: >60 mL/min (ref >60)
GLUCOSE: 113 mg/dL — AB (ref 65–99)
POTASSIUM: 3.8 mmol/L (ref 3.5–5.1)
Sodium: 135 mmol/L (ref 135–145)

## 2017-12-07 LAB — CK: Total CK: 836 U/L — ABNORMAL HIGH (ref 38–234)

## 2017-12-07 LAB — PROTIME-INR
INR: 2.5
Prothrombin Time: 26.8 seconds — ABNORMAL HIGH (ref 11.4–15.2)

## 2017-12-07 MED ORDER — WARFARIN SODIUM 1 MG PO TABS
1.0000 mg | ORAL_TABLET | Freq: Once | ORAL | Status: AC
  Administered 2017-12-07: 1 mg via ORAL
  Filled 2017-12-07: qty 1

## 2017-12-07 NOTE — Progress Notes (Signed)
Bedside shift report completed with Kendal HymenBonnie, RN and patient. She is alert and participates in report. She denies pain and doesn't have any questions or concerns at this time. Will continue to monitor.

## 2017-12-07 NOTE — Progress Notes (Signed)
ANTICOAGULATION CONSULT NOTE  Pharmacy Consult for Warfarin Indication: atrial fibrillation  Allergies  Allergen Reactions  . Contrast Media [Iodinated Diagnostic Agents] Other (See Comments)    "shaking"  . Penicillins Other (See Comments)    Has patient had a PCN reaction causing immediate rash, facial/tongue/throat swelling, SOB or lightheadedness with hypotension: Unknown Has patient had a PCN reaction causing severe rash involving mucus membranes or skin necrosis: Unknown Has patient had a PCN reaction that required hospitalization: Unknown Has patient had a PCN reaction occurring within the last 10 years: Unknown If all of the above answers are "NO", then may proceed with Cephalosporin use.  . Sulfa Antibiotics    Patient Measurements: Height: 5\' 6"  (167.6 cm) Weight: 149 lb 4 oz (67.7 kg) IBW/kg (Calculated) : 59.3 Heparin Dosing Weight:   Vital Signs: BP: 142/65 (01/10 0500) Pulse Rate: 84 (01/10 0500)  Labs: Recent Labs    12/04/17 1333  12/05/17 0624 12/06/17 0625 12/07/17 0611  HGB 13.2  --   --   --   --   HCT 42.3  --   --   --   --   PLT 162  --   --   --   --   LABPROT  --    < > 27.3* 26.2* 26.8*  INR  --    < > 2.56 2.42 2.50  CREATININE 0.94  --   --  0.77 0.63  CKTOTAL 1,083*  --  801* 972*  --    < > = values in this interval not displayed.   Estimated Creatinine Clearance: 45.5 mL/min (by C-G formula based on SCr of 0.63 mg/dL).  Medical History: Past Medical History:  Diagnosis Date  . Acid reflux   . Atrial fibrillation (HCC)   . Atrophic vaginitis   . Chronic cystitis   . Decubitus ulcer of buttock   . Dementia   . Hyperlipidemia   . Hypertension   . Incomplete bladder emptying   . Microscopic hematuria   . Pacemaker   . Straining on urination   . Symptomatic bradycardia   . Urge incontinence of urine   . UTI (lower urinary tract infection)    Medications:  Medications Prior to Admission  Medication Sig Dispense Refill Last  Dose  . cephALEXin (KEFLEX) 250 MG capsule Take 1 capsule by mouth daily.   Past Week at Unknown time  . ciprofloxacin (CIPRO) 250 MG tablet Take 1 tablet (250 mg total) by mouth 2 (two) times daily. 10 tablet 0 Past Week at Unknown time  . furosemide (LASIX) 40 MG tablet Take 40 mg by mouth daily.    Past Week at Unknown time  . metoprolol succinate (TOPROL-XL) 25 MG 24 hr tablet TAKE 1 AND 1/2 TABLETS BY MOUTH ONCE DAILY. 135 tablet 0 Past Week at Unknown time  . Multiple Vitamins-Minerals (CENTRUM SILVER PO) Take 1 tablet by mouth daily.     Past Week at Unknown time  . omeprazole (PRILOSEC) 20 MG capsule Take 20 mg by mouth daily.     Past Week at Unknown time  . potassium chloride SA (K-DUR,KLOR-CON) 20 MEQ tablet Take 1 tablet by mouth daily.   Past Week at Unknown time  . simvastatin (ZOCOR) 10 MG tablet Take 10 mg by mouth daily.   Past Week at Unknown time  . Travoprost, BAK Free, (TRAVATAN Z) 0.004 % SOLN ophthalmic solution Place 1 drop into both eyes at bedtime.   Past Week at Unknown time  .  warfarin (COUMADIN) 3 MG tablet TAKE 1 TO 1 AND 1/2 TABLET BY MOUTH DAILY AS DIRECTED. (Patient taking differently: TAKE 1 TABLET BY MOUTH DAILY except takes 1/2 tablet (1.5mg ) on Mondays.Marland Kitchen.) 120 tablet 8 Past Week at Unknown time   Assessment: INR is now therapeutic.   No bleeding noted.  Goal of Therapy:  INR 2-3   Plan:  Warfarin 1mg  PO x 1 Daily PT/INR Monitor for signs and symptoms of bleeding.   Margo AyeHall, Griff Badley A 12/07/2017,11:13 AM

## 2017-12-07 NOTE — Care Management Important Message (Signed)
Important Message  Patient Details  Name: [name] MRN: [id number] Date of Birth: 1927-01-31   Medicare Important Message Given:  Yes    Jeno Calleros, Chrystine OilerSharley Diane, RN 12/07/2017, 3:47 PM

## 2017-12-07 NOTE — Clinical Social Work Note (Signed)
LCSW following. Pt was offered a bed at [facility]. Discussed with pt's daughter yesterday and they will accept the bed offer. Initiated SNF authorization with pt's [employer] insurance. If pt is medically clear for dc before the auth is obtained, will have to look at an LOG for admission.

## 2017-12-07 NOTE — Progress Notes (Signed)
PROGRESS NOTE                                                                                                                                                                                                             Patient Demographics:    [name], is a 84 y.o. female, DOB - 06-22-1927, WUJ:[id number]  Admit date - 12/04/2017   Admitting Physician Courage Mariea Clonts, MD  Outpatient Primary MD for the patient is Avon Gully, MD  LOS - 3  Outpatient Specialists: None  Chief Complaint  Patient presents with  . Altered Mental Status       Brief Narrative  84 year old female who lives alone in her apartment with history of A. fib on anti-evaluation, this make her status, and mitral insufficiency brought to the ED by EMS after she was found on the side of the bed by her daughter for on known duration with her arm caught between the metal railing of her bed. The trailing had to be cut to let her on low-dose. She was last seen the previous night (over 12 hours back). Per daughter patient has slow progressive confusion over past several months but for the past 5 days symptoms have worsened. She was seen by her PCP recently who thought patient did not have any infection or UTI but the daughter was still giving patient some ciprofloxacin. Denied any fevers, chills, nausea, vomiting, diarrhea, shortness of breath, chest pain or abdominal pain. At baseline patient ambulates with a walker, has slow progressive confusion but is able to carry out some conversation.  In the ED patient was tachycardic to 130s, elevated blood pressure. Blood work showed UA suggestive of UTI and elevated CK of 1000. CT head, chest x-ray negative for acute findings. X-ray of the right humerus and forearm negative for fracture. Culture sent and patient started on IV Rocephin. Hospitalist consulted for admission for UTI with acute metabolic encephalopathy and  rhabdomyolysis.   Subjective:   Wakes up to voice.  Denies any specific complaints.  No shortness of breath.  Denies any pain.   Assessment  & Plan :    Principal Problem: Acute metabolic encephalopathy - acute neurological findings, able to move her upper extremities with no asymmetric deficiency, negative any facial asymmetry, speech intact rather slow, cognition intact, able to follow commands   possibly due to  UTI.  Urine culture positive for gram-negative rods, blood culture has shown no growth. Continue empiric Rocephin. Mentation improving.  Continue IV hydration.  Active Problems: Rhabdomyolysis, mild  secondary to fall. CPK has not completely trended down yet, we will continue IV fluid hydration Monitoring mostly.  Continue to hold her her simvastatin  Atrial fibrillation [facility])-  Pacemaker status Rate controlled currently.  Continue Coumadin with dosing per pharmacy.  Continue metoprolol.  Generalized weakness -for falls, PT/OT consulted for evaluation recommendation Highly recommending SNF placement     Code Status : Full code Family Communication  : No family present Disposition Plan  : Will need skilled nursing facility placement on discharge Barriers For Discharge : Active symptoms Consults  :  None  Procedures  : CT head  DVT Prophylaxis  :  Coumadin  Lab Results  Component Value Date   PLT 162 12/04/2017    Antibiotics  :    Anti-infectives (From admission, onward)   Start     Dose/Rate Route Frequency Ordered Stop   12/05/17 1500  cefTRIAXone (ROCEPHIN) 1 g in dextrose 5 % 50 mL IVPB     1 g 100 mL/hr over 30 Minutes Intravenous Every 24 hours 12/04/17 2014     12/04/17 1445  cefTRIAXone (ROCEPHIN) 1 g in dextrose 5 % 50 mL IVPB     1 g 100 mL/hr over 30 Minutes Intravenous  Once 12/04/17 1435 12/04/17 1540        Objective:   Vitals:   12/06/17 1451 12/06/17 2118 12/07/17 0500 12/07/17 1500  BP: 128/68 (!) 140/53 (!) 142/65 (!) 124/34    Pulse: 87 68 84 64  Resp: 16 18 18 18   Temp: 98.2 F (36.8 C) 97.6 F (36.4 C)  98.1 F (36.7 C)  TempSrc: Oral Oral  Oral  SpO2: 100% 96% 99% 100%  Weight:      Height:        Wt Readings from Last 3 Encounters:  12/06/17 67.7 kg (149 lb 4 oz)  09/12/17 74.4 kg (164 lb)  07/12/17 73.9 kg (163 lb)     Intake/Output Summary (Last 24 hours) at 12/07/2017 1908 Last data filed at 12/07/2017 1500 Gross per 24 hour  Intake 2486.67 ml  Output 700 ml  Net 1786.67 ml     Physical Exam  Gen: not in distress HEENT: no pallor, moist mucosa, supple neck Chest: clear b/l, no added sounds CVS: S1 and S2 irregular, no murmurs rub or gallop GI: soft, NT, ND, BS+ Musculoskeletal: warm, chronic right knee swelling , no edema, bruising over right forearm CNS: AAOX1-2, nonfocal    Data Review:    CBC Recent Labs  Lab 12/04/17 1333  WBC 10.1  HGB 13.2  HCT 42.3  PLT 162  MCV 94.4  MCH 29.5  MCHC 31.2  RDW 14.5  LYMPHSABS 1.1  MONOABS 0.9  EOSABS 0.0  BASOSABS 0.0    Chemistries  Recent Labs  Lab 12/04/17 1333 12/06/17 0625 12/07/17 0611  NA 138 133* 135  K 4.6 4.0 3.8  CL 106 105 106  CO2 21* 20* 21*  GLUCOSE 129* 120* 113*  BUN 19 15 10   CREATININE 0.94 0.77 0.63  CALCIUM 8.3* 8.2* 8.3*  AST 39  --   --   ALT 15  --   --   ALKPHOS 63  --   --   BILITOT 0.5  --   --    ------------------------------------------------------------------------------------------------------------------ No results for input(s): CHOL, HDL, LDLCALC, TRIG,  CHOLHDL, LDLDIRECT in the last 72 hours.  No results found for: HGBA1C ------------------------------------------------------------------------------------------------------------------ No results for input(s): TSH, T4TOTAL, T3FREE, THYROIDAB in the last 72 hours.  Invalid input(s): FREET3 ------------------------------------------------------------------------------------------------------------------ No results for  input(s): VITAMINB12, FOLATE, FERRITIN, TIBC, IRON, RETICCTPCT in the last 72 hours.  Coagulation profile Recent Labs  Lab 12/04/17 1754 12/05/17 0624 12/06/17 0625 12/07/17 0611  INR 1.98 2.56 2.42 2.50    No results for input(s): DDIMER in the last 72 hours.  Cardiac Enzymes No results for input(s): CKMB, TROPONINI, MYOGLOBIN in the last 168 hours.  Invalid input(s): CK ------------------------------------------------------------------------------------------------------------------ No results found for: BNP  Inpatient Medications  Scheduled Meds: . mouth rinse  15 mL Mouth Rinse BID  . metoprolol succinate  37.5 mg Oral Daily  . pantoprazole  40 mg Oral Daily  . Warfarin - Pharmacist Dosing Inpatient   Does not apply Q24H   Continuous Infusions: . sodium chloride 100 mL/hr at 12/06/17 1538  . cefTRIAXone (ROCEPHIN)  IV Stopped (12/07/17 1519)   PRN Meds:.  Micro Results Recent Results (from the past 240 hour(s))  Culture, Urine     Status: Abnormal (Preliminary result)   Collection Time: 12/04/17 12:43 PM  Result Value Ref Range Status   Specimen Description URINE, CATHETERIZED  Final   Special Requests NONE  Final   Culture >=100,000 COLONIES/mL ESCHERICHIA COLI (A)  Final   Report Status PENDING  Incomplete  Blood culture (routine x 2)     Status: None (Preliminary result)   Collection Time: 12/04/17  3:10 PM  Result Value Ref Range Status   Specimen Description BLOOD RIGHT WRIST  Final   Special Requests   Final    BOTTLES DRAWN AEROBIC ONLY Blood Culture results may not be optimal due to an inadequate volume of blood received in culture bottles   Culture NO GROWTH 3 DAYS  Final   Report Status PENDING  Incomplete  Blood culture (routine x 2)     Status: None (Preliminary result)   Collection Time: 12/04/17  3:10 PM  Result Value Ref Range Status   Specimen Description BLOOD RIGHT FOREARM  Final   Special Requests   Final    BOTTLES DRAWN AEROBIC ONLY  Blood Culture results may not be optimal due to an inadequate volume of blood received in culture bottles   Culture NO GROWTH 3 DAYS  Final   Report Status PENDING  Incomplete    Radiology Reports Dg Chest 2 View  Result Date: 12/04/2017 CLINICAL DATA:  Altered mental status EXAM: CHEST  2 VIEW COMPARISON:  07/12/2017 chest radiograph. FINDINGS: Stable configuration of 2 lead left subclavian pacemaker. Stable cardiomediastinal silhouette with mild cardiomegaly and aortic atherosclerosis. No pneumothorax. No pleural effusion. Stable mild scarring versus atelectasis at the left lung base. No overt pulmonary edema. No acute consolidative airspace disease. Vertebral compression fractures in the lower thoracic/ upper lumbar spine are not appreciably changed since 09/09/2016 CT. IMPRESSION: 1. Stable cardiomegaly without overt pulmonary edema. 2. Stable mild scarring versus atelectasis at the left lung base. Electronically Signed   By: Delbert PhenixJason A Poff M.D.   On: 12/04/2017 13:35   Dg Pelvis 1-2 Views  Result Date: 12/04/2017 CLINICAL DATA:  84 year old female status post fall today trying to get out of bed. EXAM: PELVIS - 1-2 VIEW COMPARISON:  CT Abdomen and Pelvis 09/09/2016 and pelvis radiograph 08/04/2011. FINDINGS: Portable AP supine view at 1828 hours. Femoral heads are normally located. Grossly intact proximal femurs. The pelvis appears stable since 2012. Chronic  Aortoiliac calcified atherosclerosis. Chronic dystrophic soft tissue calcifications about both flanks. Negative visible bowel gas pattern. IMPRESSION: 1. No acute fracture identified about the pelvis. If there is lateralizing hip pain, a dedicated hip series is recommended. 2.  Aortic Atherosclerosis (ICD10-I70.0). Electronically Signed   By: Odessa Fleming M.D.   On: 12/04/2017 18:49   Dg Forearm Right  Result Date: 12/04/2017 CLINICAL DATA:  Arm caught in mid railing.  Pain. EXAM: RIGHT FOREARM - 2 VIEW COMPARISON:  None. FINDINGS: Old healed ulnar  shaft fracture. No sign of acute injury. Regional vascular calcification. IMPRESSION: No acute finding. Electronically Signed   By: Paulina Fusi M.D.   On: 12/04/2017 13:37   Ct Head Wo Contrast  Result Date: 12/04/2017 CLINICAL DATA:  84 year old with altered level of consciousness. EXAM: CT HEAD WITHOUT CONTRAST TECHNIQUE: Contiguous axial images were obtained from the base of the skull through the vertex without intravenous contrast. COMPARISON:  09/12/2017 FINDINGS: Brain: Stable diffuse cerebral atrophy. Again noted is low-density in the subcortical white matter particularly in the anterior right parietal lobe. No evidence for acute hemorrhage, mass lesion, midline shift, hydrocephalus or large infarct. Vascular: No hyperdense vessel or unexpected calcification. Skull: Normal. Negative for fracture or focal lesion. Sinuses/Orbits: Chronic changes and mucosal disease in the left maxillary sinus. Mucosal disease in the ethmoid air cells. Other: None. IMPRESSION: No acute intracranial abnormality. Stable atrophy and evidence for chronic small vessel ischemic changes. Electronically Signed   By: Richarda Overlie M.D.   On: 12/04/2017 13:47   Dg Humerus Right  Result Date: 12/04/2017 CLINICAL DATA:  All arm call out and bad railing.  Pain. EXAM: RIGHT HUMERUS - 2+ VIEW COMPARISON:  None. FINDINGS: There is no evidence of fracture or other focal bone lesions. Soft tissues are unremarkable. IMPRESSION: Negative. Electronically Signed   By: Paulina Fusi M.D.   On: 12/04/2017 13:36    Time Spent in minutes  25   Erick Blinks M.D on 12/07/2017 at 7:08 PM Between 7am to 7pm - Pager - [phone] After 7pm go to [email or web address] - password [facility] -  Office  [phone]

## 2017-12-08 DIAGNOSIS — Z7901 Long term (current) use of anticoagulants: Secondary | ICD-10-CM

## 2017-12-08 DIAGNOSIS — Z7401 Bed confinement status: Secondary | ICD-10-CM | POA: Diagnosis not present

## 2017-12-08 DIAGNOSIS — R748 Abnormal levels of other serum enzymes: Secondary | ICD-10-CM

## 2017-12-08 DIAGNOSIS — N390 Urinary tract infection, site not specified: Secondary | ICD-10-CM | POA: Diagnosis not present

## 2017-12-08 DIAGNOSIS — M79601 Pain in right arm: Secondary | ICD-10-CM | POA: Diagnosis not present

## 2017-12-08 DIAGNOSIS — F0390 Unspecified dementia without behavioral disturbance: Secondary | ICD-10-CM | POA: Diagnosis not present

## 2017-12-08 DIAGNOSIS — Z950 Presence of cardiac pacemaker: Secondary | ICD-10-CM | POA: Diagnosis not present

## 2017-12-08 DIAGNOSIS — I10 Essential (primary) hypertension: Secondary | ICD-10-CM | POA: Diagnosis not present

## 2017-12-08 DIAGNOSIS — R41841 Cognitive communication deficit: Secondary | ICD-10-CM | POA: Diagnosis not present

## 2017-12-08 DIAGNOSIS — Z9181 History of falling: Secondary | ICD-10-CM | POA: Diagnosis not present

## 2017-12-08 DIAGNOSIS — R279 Unspecified lack of coordination: Secondary | ICD-10-CM | POA: Diagnosis not present

## 2017-12-08 DIAGNOSIS — G9341 Metabolic encephalopathy: Secondary | ICD-10-CM | POA: Diagnosis not present

## 2017-12-08 DIAGNOSIS — B9620 Unspecified Escherichia coli [E. coli] as the cause of diseases classified elsewhere: Secondary | ICD-10-CM | POA: Diagnosis not present

## 2017-12-08 DIAGNOSIS — M6281 Muscle weakness (generalized): Secondary | ICD-10-CM | POA: Diagnosis not present

## 2017-12-08 DIAGNOSIS — F0391 Unspecified dementia with behavioral disturbance: Secondary | ICD-10-CM | POA: Diagnosis not present

## 2017-12-08 DIAGNOSIS — M25561 Pain in right knee: Secondary | ICD-10-CM | POA: Diagnosis not present

## 2017-12-08 DIAGNOSIS — R262 Difficulty in walking, not elsewhere classified: Secondary | ICD-10-CM | POA: Diagnosis not present

## 2017-12-08 DIAGNOSIS — I482 Chronic atrial fibrillation: Secondary | ICD-10-CM | POA: Diagnosis not present

## 2017-12-08 DIAGNOSIS — R1311 Dysphagia, oral phase: Secondary | ICD-10-CM | POA: Diagnosis not present

## 2017-12-08 DIAGNOSIS — I4891 Unspecified atrial fibrillation: Secondary | ICD-10-CM | POA: Diagnosis not present

## 2017-12-08 LAB — BASIC METABOLIC PANEL
ANION GAP: 5 (ref 5–15)
BUN: 8 mg/dL (ref 6–20)
CHLORIDE: 109 mmol/L (ref 101–111)
CO2: 23 mmol/L (ref 22–32)
Calcium: 8 mg/dL — ABNORMAL LOW (ref 8.9–10.3)
Creatinine, Ser: 0.62 mg/dL (ref 0.44–1.00)
GFR calc Af Amer: >60 mL/min (ref >60)
Glucose, Bld: 100 mg/dL — ABNORMAL HIGH (ref 65–99)
POTASSIUM: 3.7 mmol/L (ref 3.5–5.1)
SODIUM: 137 mmol/L (ref 135–145)

## 2017-12-08 LAB — CK TOTAL AND CKMB (NOT AT ARMC)
CK TOTAL: 546 U/L — AB (ref 38–234)
CK TOTAL: 514 U/L — AB (ref 38–234)
CK, MB: 2.3 ng/mL (ref 0.5–5.0)
CK, MB: 2.1 ng/mL (ref 0.5–5.0)
RELATIVE INDEX: 0.4 (ref 0.0–2.5)
RELATIVE INDEX: 0.4 (ref 0.0–2.5)

## 2017-12-08 LAB — URINE CULTURE: Culture: >=100000 — AB

## 2017-12-08 LAB — PROTIME-INR
INR: 2.64
Prothrombin Time: 28 seconds — ABNORMAL HIGH (ref 11.4–15.2)

## 2017-12-08 MED ORDER — WARFARIN SODIUM 2 MG PO TABS: 2.0000 mg | ORAL_TABLET | Freq: Once | ORAL | Status: DC

## 2017-12-08 MED ORDER — NITROFURANTOIN MONOHYD MACRO 100 MG PO CAPS: 100.0000 mg | ORAL_CAPSULE | Freq: Two times a day (BID) | ORAL | 0 refills | Status: AC

## 2017-12-08 NOTE — Discharge Summary (Addendum)
Physician Discharge Summary  [name] AVW:[id number] DOB: 1927/09/16 DOA: 12/04/2017  PCP: Avon Gully, MD  Admit date: 12/04/2017 Discharge date: 12/08/2017  Admitted From: home Disposition:  SNF  Recommendations for Outpatient Follow-up:  1. Follow up with PCP in 1-2 weeks 2. Please obtain BMP/CBC in one week  Discharge Condition: stable CODE STATUS: full code Diet recommendation: Heart Healthy  Brief/Interim Summary: 84 year old female who lives alone in her apartment with history of A. fib on anti-evaluation, this make her status, and mitral insufficiency brought to the ED by EMS after she was found on the side of the bed by her daughter for on known duration with her arm caught between the metal railing of her bed. The trailing had to be cut to let her on low-dose. She was last seen the previous night (over 12 hours back). Per daughter patient has slow progressive confusion over past several months but for the past 5 days symptoms have worsened. She was seen by her PCP recently who thought patient did not have any infection or UTI but the daughter was still giving patient some ciprofloxacin. Denied any fevers, chills, nausea, vomiting, diarrhea, shortness of breath, chest pain or abdominal pain. At baseline patient ambulates with a walker, has slow progressive confusion but is able to carry out some conversation.  In the ED patient was tachycardic to 130s, elevated blood pressure. Blood work showed UA suggestive of UTI and elevated CK of 1000. CT head, chest x-ray negative for acute findings. X-ray of the right humerus and forearm negative for fracture. Culture sent and patient started on IV Rocephin. Hospitalist consulted for admission for UTI with acute metabolic encephalopathy and rhabdomyolysis.    Discharge Diagnoses:  Principal Problem:   Metabolic encephalopathy Active Problems:   Atrial fibrillation (HCC)   Long term current use of anticoagulant therapy   Pacemaker -  Medtronic 2007, new generator 2014   Mitral insufficiency   Pressure injury of skin  Acute metabolic encephalopathy - acute neurological findings, able to move her upper extremities with no asymmetric deficiency, negative any facial asymmetry, speech intact but rather slow, cognition intact, able to follow commands. Appears to be back to baseline   E coli UTI.  Urine culture positive for E coli, blood culture has shown no growth. Treated empirically with Rocephin. Mentation improving. Transitioned to nitrofurantoin based on culture sensitivities  Rhabdomyolysis, mild  secondary to fall. CPK treated with IV fluids and has been stable.  Resume simvastatin on discharge  Atrial fibrillation [facility])-  Pacemaker status Rate controlled currently.  Continue Coumadin with dosing per pharmacy.  Continue metoprolol.  Generalized weakness -for falls, PT/OT consulted for evaluation recommendation Highly recommending SNF placement  Stage 1 sacral pressure injury, present on admission, continue supportive care  Discharge Instructions  Discharge Instructions    Diet - low sodium heart healthy   Complete by:  As directed    Increase activity slowly   Complete by:  As directed      Allergies as of 12/08/2017      Reactions   Contrast Media [iodinated Diagnostic Agents] Other (See Comments)   "shaking"   Penicillins Other (See Comments)   Has patient had a PCN reaction causing immediate rash, facial/tongue/throat swelling, SOB or lightheadedness with hypotension: Unknown Has patient had a PCN reaction causing severe rash involving mucus membranes or skin necrosis: Unknown Has patient had a PCN reaction that required hospitalization: Unknown Has patient had a PCN reaction occurring within the last 10 years: Unknown If  all of the above answers are "NO", then may proceed with Cephalosporin use.   Sulfa Antibiotics       Medication List    STOP taking these medications   cephALEXin 250 MG  capsule Commonly known as:  KEFLEX   ciprofloxacin 250 MG tablet Commonly known as:  CIPRO     TAKE these medications   CENTRUM SILVER PO Take 1 tablet by mouth daily.   furosemide 40 MG tablet Commonly known as:  LASIX Take 40 mg by mouth daily.   metoprolol succinate 25 MG 24 hr tablet Commonly known as:  TOPROL-XL TAKE 1 AND 1/2 TABLETS BY MOUTH ONCE DAILY.   nitrofurantoin (macrocrystal-monohydrate) 100 MG capsule Commonly known as:  MACROBID Take 1 capsule (100 mg total) by mouth 2 (two) times daily for 3 days.   omeprazole 20 MG capsule Commonly known as:  PRILOSEC Take 20 mg by mouth daily.   potassium chloride SA 20 MEQ tablet Commonly known as:  K-DUR,KLOR-CON Take 1 tablet by mouth daily.   simvastatin 10 MG tablet Commonly known as:  ZOCOR Take 10 mg by mouth daily.   TRAVATAN Z 0.004 % Soln ophthalmic solution Generic drug:  Travoprost (BAK Free) Place 1 drop into both eyes at bedtime.   warfarin 3 MG tablet Commonly known as:  COUMADIN Take as directed. If you are unsure how to take this medication, talk to your nurse or doctor. Original instructions:  TAKE 1 TO 1 AND 1/2 TABLET BY MOUTH DAILY AS DIRECTED. What changed:  See the new instructions.      Contact information for after-discharge care    Destination    [facility] .   Service:  Skilled Nursing Contact information: [address] [place] [id number] [phone]             Allergies  Allergen Reactions  . Contrast Media [Iodinated Diagnostic Agents] Other (See Comments)    "shaking"  . Penicillins Other (See Comments)    Has patient had a PCN reaction causing immediate rash, facial/tongue/throat swelling, SOB or lightheadedness with hypotension: Unknown Has patient had a PCN reaction causing severe rash involving mucus membranes or skin necrosis: Unknown Has patient had a PCN reaction that required hospitalization: Unknown Has patient had a PCN  reaction occurring within the last 10 years: Unknown If all of the above answers are "NO", then may proceed with Cephalosporin use.  . Sulfa Antibiotics     Consultations:     Procedures/Studies: Dg Chest 2 View  Result Date: 12/04/2017 CLINICAL DATA:  Altered mental status EXAM: CHEST  2 VIEW COMPARISON:  07/12/2017 chest radiograph. FINDINGS: Stable configuration of 2 lead left subclavian pacemaker. Stable cardiomediastinal silhouette with mild cardiomegaly and aortic atherosclerosis. No pneumothorax. No pleural effusion. Stable mild scarring versus atelectasis at the left lung base. No overt pulmonary edema. No acute consolidative airspace disease. Vertebral compression fractures in the lower thoracic/ upper lumbar spine are not appreciably changed since 09/09/2016 CT. IMPRESSION: 1. Stable cardiomegaly without overt pulmonary edema. 2. Stable mild scarring versus atelectasis at the left lung base. Electronically Signed   By: Delbert PhenixJason A Poff M.D.   On: 12/04/2017 13:35   Dg Pelvis 1-2 Views  Result Date: 12/04/2017 CLINICAL DATA:  84 year old female status post fall today trying to get out of bed. EXAM: PELVIS - 1-2 VIEW COMPARISON:  CT Abdomen and Pelvis 09/09/2016 and pelvis radiograph 08/04/2011. FINDINGS: Portable AP supine view at 1828 hours. Femoral heads are normally located. Grossly  intact proximal femurs. The pelvis appears stable since 2012. Chronic Aortoiliac calcified atherosclerosis. Chronic dystrophic soft tissue calcifications about both flanks. Negative visible bowel gas pattern. IMPRESSION: 1. No acute fracture identified about the pelvis. If there is lateralizing hip pain, a dedicated hip series is recommended. 2.  Aortic Atherosclerosis (ICD10-I70.0). Electronically Signed   By: Odessa Fleming M.D.   On: 12/04/2017 18:49   Dg Forearm Right  Result Date: 12/04/2017 CLINICAL DATA:  Arm caught in mid railing.  Pain. EXAM: RIGHT FOREARM - 2 VIEW COMPARISON:  None. FINDINGS: Old healed ulnar  shaft fracture. No sign of acute injury. Regional vascular calcification. IMPRESSION: No acute finding. Electronically Signed   By: Paulina Fusi M.D.   On: 12/04/2017 13:37   Ct Head Wo Contrast  Result Date: 12/04/2017 CLINICAL DATA:  84 year old with altered level of consciousness. EXAM: CT HEAD WITHOUT CONTRAST TECHNIQUE: Contiguous axial images were obtained from the base of the skull through the vertex without intravenous contrast. COMPARISON:  09/12/2017 FINDINGS: Brain: Stable diffuse cerebral atrophy. Again noted is low-density in the subcortical white matter particularly in the anterior right parietal lobe. No evidence for acute hemorrhage, mass lesion, midline shift, hydrocephalus or large infarct. Vascular: No hyperdense vessel or unexpected calcification. Skull: Normal. Negative for fracture or focal lesion. Sinuses/Orbits: Chronic changes and mucosal disease in the left maxillary sinus. Mucosal disease in the ethmoid air cells. Other: None. IMPRESSION: No acute intracranial abnormality. Stable atrophy and evidence for chronic small vessel ischemic changes. Electronically Signed   By: Richarda Overlie M.D.   On: 12/04/2017 13:47   Dg Humerus Right  Result Date: 12/04/2017 CLINICAL DATA:  All arm call out and bad railing.  Pain. EXAM: RIGHT HUMERUS - 2+ VIEW COMPARISON:  None. FINDINGS: There is no evidence of fracture or other focal bone lesions. Soft tissues are unremarkable. IMPRESSION: Negative. Electronically Signed   By: Paulina Fusi M.D.   On: 12/04/2017 13:36     Subjective: Denies any shortness of breath or chest pain  Discharge Exam: Vitals:   12/08/17 0526 12/08/17 0829  BP: 136/66   Pulse: 70   Resp: 18   Temp: 97.7 F (36.5 C)   SpO2: 97% 99%   Vitals:   12/07/17 1500 12/07/17 2232 12/08/17 0526 12/08/17 0829  BP: (!) 124/34 (!) 137/40 136/66   Pulse: 64 78 70   Resp: 18 18 18    Temp: 98.1 F (36.7 C) 97.6 F (36.4 C) 97.7 F (36.5 C)   TempSrc: Oral Oral Oral    SpO2: 100% 99% 97% 99%  Weight:      Height:        General: Pt is alert, awake, not in acute distress Cardiovascular: irregular S1/S2 +, no rubs, no gallops Respiratory: CTA bilaterally, no wheezing, no rhonchi Abdominal: Soft, NT, ND, bowel sounds + Extremities: swelling of right arm    The results of significant diagnostics from this hospitalization (including imaging, microbiology, ancillary and laboratory) are listed below for reference.     Microbiology: Recent Results (from the past 240 hour(s))  Culture, Urine     Status: Abnormal   Collection Time: 12/04/17 12:43 PM  Result Value Ref Range Status   Specimen Description URINE, CATHETERIZED  Final   Special Requests NONE  Final   Culture >=100,000 COLONIES/mL ESCHERICHIA COLI (A)  Final   Report Status 12/08/2017 FINAL  Final   Organism ID, Bacteria ESCHERICHIA COLI (A)  Final      Susceptibility   Escherichia coli -  MIC*    AMPICILLIN >=32 RESISTANT Resistant     CEFAZOLIN <=4 SENSITIVE Sensitive     CEFTRIAXONE <=1 SENSITIVE Sensitive     CIPROFLOXACIN >=4 RESISTANT Resistant     GENTAMICIN <=1 SENSITIVE Sensitive     IMIPENEM <=0.25 SENSITIVE Sensitive     NITROFURANTOIN <=16 SENSITIVE Sensitive     TRIMETH/SULFA <=20 SENSITIVE Sensitive     AMPICILLIN/SULBACTAM 8 SENSITIVE Sensitive     PIP/TAZO <=4 SENSITIVE Sensitive     Extended ESBL NEGATIVE Sensitive     * >=100,000 COLONIES/mL ESCHERICHIA COLI  Blood culture (routine x 2)     Status: None (Preliminary result)   Collection Time: 12/04/17  3:10 PM  Result Value Ref Range Status   Specimen Description BLOOD RIGHT WRIST  Final   Special Requests   Final    BOTTLES DRAWN AEROBIC ONLY Blood Culture results may not be optimal due to an inadequate volume of blood received in culture bottles   Culture NO GROWTH 4 DAYS  Final   Report Status PENDING  Incomplete  Blood culture (routine x 2)     Status: None (Preliminary result)   Collection Time: 12/04/17   3:10 PM  Result Value Ref Range Status   Specimen Description BLOOD RIGHT FOREARM  Final   Special Requests   Final    BOTTLES DRAWN AEROBIC ONLY Blood Culture results may not be optimal due to an inadequate volume of blood received in culture bottles   Culture NO GROWTH 4 DAYS  Final   Report Status PENDING  Incomplete     Labs: BNP (last 3 results) No results for input(s): BNP in the last 8760 hours. Basic Metabolic Panel: Recent Labs  Lab 12/04/17 1333 12/06/17 0625 12/07/17 0611 12/08/17 0543  NA 138 133* 135 137  K 4.6 4.0 3.8 3.7  CL 106 105 106 109  CO2 21* 20* 21* 23  GLUCOSE 129* 120* 113* 100*  BUN 19 15 10 8   CREATININE 0.94 0.77 0.63 0.62  CALCIUM 8.3* 8.2* 8.3* 8.0*   Liver Function Tests: Recent Labs  Lab 12/04/17 1333  AST 39  ALT 15  ALKPHOS 63  BILITOT 0.5  PROT 6.6  ALBUMIN 3.2*   No results for input(s): LIPASE, AMYLASE in the last 168 hours. No results for input(s): AMMONIA in the last 168 hours. CBC: Recent Labs  Lab 12/04/17 1333  WBC 10.1  NEUTROABS 8.1  HGB 13.2  HCT 42.3  MCV 94.4  PLT 162   Cardiac Enzymes: Recent Labs  Lab 12/04/17 1333 12/05/17 0624 12/06/17 0625 12/07/17 0611  CKTOTAL 1,083* 801* 972* 836*   BNP: Invalid input(s): POCBNP CBG: Recent Labs  Lab 12/04/17 1343  GLUCAP 144*   D-Dimer No results for input(s): DDIMER in the last 72 hours. Hgb A1c No results for input(s): HGBA1C in the last 72 hours. Lipid Profile No results for input(s): CHOL, HDL, LDLCALC, TRIG, CHOLHDL, LDLDIRECT in the last 72 hours. Thyroid function studies No results for input(s): TSH, T4TOTAL, T3FREE, THYROIDAB in the last 72 hours.  Invalid input(s): FREET3 Anemia work up No results for input(s): VITAMINB12, FOLATE, FERRITIN, TIBC, IRON, RETICCTPCT in the last 72 hours. Urinalysis    Component Value Date/Time   COLORURINE YELLOW 12/04/2017 1243   APPEARANCEUR CLOUDY (A) 12/04/2017 1243   LABSPEC >1.030 (H) 12/04/2017  1243   PHURINE 6.0 12/04/2017 1243   GLUCOSEU NEGATIVE 12/04/2017 1243   HGBUR LARGE (A) 12/04/2017 1243   BILIRUBINUR NEGATIVE 12/04/2017 1243  KETONESUR 40 (A) 12/04/2017 1243   PROTEINUR 100 (A) 12/04/2017 1243   UROBILINOGEN 0.2 10/05/2015 1208   NITRITE POSITIVE (A) 12/04/2017 1243   LEUKOCYTESUR MODERATE (A) 12/04/2017 1243   Sepsis Labs Invalid input(s): PROCALCITONIN,  WBC,  LACTICIDVEN Microbiology Recent Results (from the past 240 hour(s))  Culture, Urine     Status: Abnormal   Collection Time: 12/04/17 12:43 PM  Result Value Ref Range Status   Specimen Description URINE, CATHETERIZED  Final   Special Requests NONE  Final   Culture >=100,000 COLONIES/mL ESCHERICHIA COLI (A)  Final   Report Status 12/08/2017 FINAL  Final   Organism ID, Bacteria ESCHERICHIA COLI (A)  Final      Susceptibility   Escherichia coli - MIC*    AMPICILLIN >=32 RESISTANT Resistant     CEFAZOLIN <=4 SENSITIVE Sensitive     CEFTRIAXONE <=1 SENSITIVE Sensitive     CIPROFLOXACIN >=4 RESISTANT Resistant     GENTAMICIN <=1 SENSITIVE Sensitive     IMIPENEM <=0.25 SENSITIVE Sensitive     NITROFURANTOIN <=16 SENSITIVE Sensitive     TRIMETH/SULFA <=20 SENSITIVE Sensitive     AMPICILLIN/SULBACTAM 8 SENSITIVE Sensitive     PIP/TAZO <=4 SENSITIVE Sensitive     Extended ESBL NEGATIVE Sensitive     * >=100,000 COLONIES/mL ESCHERICHIA COLI  Blood culture (routine x 2)     Status: None (Preliminary result)   Collection Time: 12/04/17  3:10 PM  Result Value Ref Range Status   Specimen Description BLOOD RIGHT WRIST  Final   Special Requests   Final    BOTTLES DRAWN AEROBIC ONLY Blood Culture results may not be optimal due to an inadequate volume of blood received in culture bottles   Culture NO GROWTH 4 DAYS  Final   Report Status PENDING  Incomplete  Blood culture (routine x 2)     Status: None (Preliminary result)   Collection Time: 12/04/17  3:10 PM  Result Value Ref Range Status   Specimen  Description BLOOD RIGHT FOREARM  Final   Special Requests   Final    BOTTLES DRAWN AEROBIC ONLY Blood Culture results may not be optimal due to an inadequate volume of blood received in culture bottles   Culture NO GROWTH 4 DAYS  Final   Report Status PENDING  Incomplete     Time coordinating discharge: Over 30 minutes  SIGNED:   Erick Blinks, MD  [facility] 12/08/2017, 12:15 PM Pager   If 7PM-7AM, please contact night-coverage [email or web address] Password [email or web address]

## 2017-12-08 NOTE — Progress Notes (Signed)
ANTICOAGULATION CONSULT NOTE  Pharmacy Consult for Warfarin Indication: atrial fibrillation  Allergies  Allergen Reactions  . Contrast Media [Iodinated Diagnostic Agents] Other (See Comments)    "shaking"  . Penicillins Other (See Comments)    Has patient had a PCN reaction causing immediate rash, facial/tongue/throat swelling, SOB or lightheadedness with hypotension: Unknown Has patient had a PCN reaction causing severe rash involving mucus membranes or skin necrosis: Unknown Has patient had a PCN reaction that required hospitalization: Unknown Has patient had a PCN reaction occurring within the last 10 years: Unknown If all of the above answers are "NO", then may proceed with Cephalosporin use.  . Sulfa Antibiotics    Patient Measurements: Height: 5\' 6"  (167.6 cm) Weight: 149 lb 4 oz (67.7 kg) IBW/kg (Calculated) : 59.3  Vital Signs: Temp: 97.7 F (36.5 C) (01/11 0526) Temp Source: Oral (01/11 0526) BP: 136/66 (01/11 0526) Pulse Rate: 70 (01/11 0526)  Labs: Recent Labs    12/06/17 0625 12/07/17 0611 12/08/17 0543  LABPROT 26.2* 26.8* 28.0*  INR 2.42 2.50 2.64  CREATININE 0.77 0.63 0.62  CKTOTAL 972* 836*  --    Estimated Creatinine Clearance: 45.5 mL/min (by C-G formula based on SCr of 0.62 mg/dL).  Medical History: Past Medical History:  Diagnosis Date  . Acid reflux   . Atrial fibrillation (HCC)   . Atrophic vaginitis   . Chronic cystitis   . Decubitus ulcer of buttock   . Dementia   . Hyperlipidemia   . Hypertension   . Incomplete bladder emptying   . Microscopic hematuria   . Pacemaker   . Straining on urination   . Symptomatic bradycardia   . Urge incontinence of urine   . UTI (lower urinary tract infection)    Medications:  Medications Prior to Admission  Medication Sig Dispense Refill Last Dose  . cephALEXin (KEFLEX) 250 MG capsule Take 1 capsule by mouth daily.   Past Week at Unknown time  . ciprofloxacin (CIPRO) 250 MG tablet Take 1 tablet  (250 mg total) by mouth 2 (two) times daily. 10 tablet 0 Past Week at Unknown time  . furosemide (LASIX) 40 MG tablet Take 40 mg by mouth daily.    Past Week at Unknown time  . metoprolol succinate (TOPROL-XL) 25 MG 24 hr tablet TAKE 1 AND 1/2 TABLETS BY MOUTH ONCE DAILY. 135 tablet 0 Past Week at Unknown time  . Multiple Vitamins-Minerals (CENTRUM SILVER PO) Take 1 tablet by mouth daily.     Past Week at Unknown time  . omeprazole (PRILOSEC) 20 MG capsule Take 20 mg by mouth daily.     Past Week at Unknown time  . potassium chloride SA (K-DUR,KLOR-CON) 20 MEQ tablet Take 1 tablet by mouth daily.   Past Week at Unknown time  . simvastatin (ZOCOR) 10 MG tablet Take 10 mg by mouth daily.   Past Week at Unknown time  . Travoprost, BAK Free, (TRAVATAN Z) 0.004 % SOLN ophthalmic solution Place 1 drop into both eyes at bedtime.   Past Week at Unknown time  . warfarin (COUMADIN) 3 MG tablet TAKE 1 TO 1 AND 1/2 TABLET BY MOUTH DAILY AS DIRECTED. (Patient taking differently: TAKE 1 TABLET BY MOUTH DAILY except takes 1/2 tablet (1.5mg ) on Mondays.Marland Kitchen) 120 tablet 8 Past Week at Unknown time   Assessment: INR is now therapeutic.   No bleeding noted.  Goal of Therapy:  INR 2-3   Plan:  Warfarin 2mg  PO x 1 Daily PT/INR Monitor for signs and  symptoms of bleeding.   Elder CyphersLorie Dinesha Twiggs, BS Loura BackPharm D, BCPS Clinical Pharmacist Pager [phone] 12/08/2017,8:41 AM

## 2017-12-08 NOTE — Clinical Social Work Placement (Signed)
   CLINICAL SOCIAL WORK PLACEMENT  NOTE  Date:  12/08/2017  Patient Details  Name: [name] MRN: [id number] Date of Birth: 02-Apr-1927  Clinical Social Work is seeking post-discharge placement for this patient at the [place] level of care (*CSW will initial, date and re-position this form in  chart as items are completed):  Yes   Patient/family provided with [facility] Work Department's list of facilities offering this level of care within the geographic area requested by the patient (or if unable, by the patient's family).  Yes   Patient/family informed of their freedom to choose among providers that offer the needed level of care, that participate in Medicare, Medicaid or managed care program needed by the patient, have an available bed and are willing to accept the patient.  Yes   Patient/family informed of [facility]'s ownership interest in [facility] and [facility], as well as of the fact that they are under no obligation to receive care at these facilities.  PASRR submitted to EDS on 12/06/17     PASRR number received on 12/06/17     Existing PASRR number confirmed on       FL2 transmitted to all facilities in geographic area requested by pt/family on 12/06/17     FL2 transmitted to all facilities within larger geographic area on       Patient informed that his/her managed care company has contracts with or will negotiate with certain facilities, including the following:        Yes   Patient/family informed of bed offers received.  Patient chooses bed at Other - please specify in the comment section below:([facility])     Physician recommends and patient chooses bed at      Patient to be transferred to Other - please specify in the comment section below:([facility]) on 12/08/17.  Patient to be transferred to facility by EMS     Patient family notified on 12/08/17 of transfer.  Name of family member notified:  Reeve Mallo (Dtr)       PHYSICIAN       Additional Comment: Pt stable for dc today per MD. Met with pt and her dtr to update. They remain in agreement with plan for transfer to [facility] for short term rehab. Updated Debbie at [facility]. They will accept pt today. Envelope prepared and dc clinical sent through Dawn. Updated pt's RN. There are no other SW needs for dc.   _______________________________________________ Shade Flood, LCSW 12/08/2017, 12:26 PM

## 2017-12-08 NOTE — Progress Notes (Signed)
Discharge instructions called in to Delena ServeKim Greene, RN Unit Coordinator at [facility] at [phone]. AVS printed and placed in envelope. IV removed, patient tolerated procedure well. Awaiting EMS for transport.

## 2017-12-08 NOTE — Clinical Social Work Note (Signed)
Pt's insurance has given authorization for SNF admission. [facility].

## 2017-12-09 LAB — CULTURE, BLOOD (ROUTINE X 2)
CULTURE: NO GROWTH
CULTURE: NO GROWTH

## 2017-12-11 DIAGNOSIS — F0390 Unspecified dementia without behavioral disturbance: Secondary | ICD-10-CM | POA: Diagnosis not present

## 2017-12-11 DIAGNOSIS — G9341 Metabolic encephalopathy: Secondary | ICD-10-CM | POA: Diagnosis not present

## 2017-12-11 DIAGNOSIS — Z9181 History of falling: Secondary | ICD-10-CM | POA: Diagnosis not present

## 2017-12-11 DIAGNOSIS — B9620 Unspecified Escherichia coli [E. coli] as the cause of diseases classified elsewhere: Secondary | ICD-10-CM | POA: Diagnosis not present

## 2017-12-11 DIAGNOSIS — N390 Urinary tract infection, site not specified: Secondary | ICD-10-CM | POA: Diagnosis not present

## 2017-12-11 DIAGNOSIS — M6281 Muscle weakness (generalized): Secondary | ICD-10-CM | POA: Diagnosis not present

## 2017-12-12 DIAGNOSIS — N390 Urinary tract infection, site not specified: Secondary | ICD-10-CM | POA: Diagnosis not present

## 2017-12-12 DIAGNOSIS — F0390 Unspecified dementia without behavioral disturbance: Secondary | ICD-10-CM | POA: Diagnosis not present

## 2017-12-12 DIAGNOSIS — B9620 Unspecified Escherichia coli [E. coli] as the cause of diseases classified elsewhere: Secondary | ICD-10-CM | POA: Diagnosis not present

## 2017-12-12 DIAGNOSIS — G9341 Metabolic encephalopathy: Secondary | ICD-10-CM | POA: Diagnosis not present

## 2017-12-13 DIAGNOSIS — Z9181 History of falling: Secondary | ICD-10-CM | POA: Diagnosis not present

## 2017-12-13 DIAGNOSIS — F0390 Unspecified dementia without behavioral disturbance: Secondary | ICD-10-CM | POA: Diagnosis not present

## 2017-12-13 DIAGNOSIS — M6281 Muscle weakness (generalized): Secondary | ICD-10-CM | POA: Diagnosis not present

## 2017-12-13 DIAGNOSIS — M79601 Pain in right arm: Secondary | ICD-10-CM | POA: Diagnosis not present

## 2017-12-18 DIAGNOSIS — N390 Urinary tract infection, site not specified: Secondary | ICD-10-CM | POA: Diagnosis not present

## 2017-12-18 DIAGNOSIS — G9341 Metabolic encephalopathy: Secondary | ICD-10-CM | POA: Diagnosis not present

## 2017-12-18 DIAGNOSIS — B9620 Unspecified Escherichia coli [E. coli] as the cause of diseases classified elsewhere: Secondary | ICD-10-CM | POA: Diagnosis not present

## 2017-12-18 DIAGNOSIS — I10 Essential (primary) hypertension: Secondary | ICD-10-CM | POA: Diagnosis not present

## 2017-12-20 DIAGNOSIS — M79601 Pain in right arm: Secondary | ICD-10-CM | POA: Diagnosis not present

## 2017-12-20 DIAGNOSIS — Z9181 History of falling: Secondary | ICD-10-CM | POA: Diagnosis not present

## 2017-12-20 DIAGNOSIS — M6281 Muscle weakness (generalized): Secondary | ICD-10-CM | POA: Diagnosis not present

## 2017-12-20 DIAGNOSIS — F0390 Unspecified dementia without behavioral disturbance: Secondary | ICD-10-CM | POA: Diagnosis not present

## 2017-12-20 DIAGNOSIS — M25561 Pain in right knee: Secondary | ICD-10-CM | POA: Diagnosis not present

## 2017-12-22 DIAGNOSIS — Z9181 History of falling: Secondary | ICD-10-CM | POA: Diagnosis not present

## 2017-12-22 DIAGNOSIS — M6281 Muscle weakness (generalized): Secondary | ICD-10-CM | POA: Diagnosis not present

## 2017-12-22 DIAGNOSIS — F0390 Unspecified dementia without behavioral disturbance: Secondary | ICD-10-CM | POA: Diagnosis not present

## 2017-12-22 DIAGNOSIS — M79601 Pain in right arm: Secondary | ICD-10-CM | POA: Diagnosis not present

## 2017-12-22 DIAGNOSIS — M25561 Pain in right knee: Secondary | ICD-10-CM | POA: Diagnosis not present

## 2017-12-25 DIAGNOSIS — M6281 Muscle weakness (generalized): Secondary | ICD-10-CM | POA: Diagnosis not present

## 2017-12-25 DIAGNOSIS — N390 Urinary tract infection, site not specified: Secondary | ICD-10-CM | POA: Diagnosis not present

## 2017-12-25 DIAGNOSIS — I4891 Unspecified atrial fibrillation: Secondary | ICD-10-CM | POA: Diagnosis not present

## 2017-12-25 DIAGNOSIS — Z7901 Long term (current) use of anticoagulants: Secondary | ICD-10-CM | POA: Diagnosis not present

## 2017-12-25 DIAGNOSIS — R262 Difficulty in walking, not elsewhere classified: Secondary | ICD-10-CM | POA: Diagnosis not present

## 2017-12-25 DIAGNOSIS — R1311 Dysphagia, oral phase: Secondary | ICD-10-CM | POA: Diagnosis not present

## 2017-12-25 DIAGNOSIS — G9341 Metabolic encephalopathy: Secondary | ICD-10-CM | POA: Diagnosis not present

## 2017-12-25 DIAGNOSIS — R41841 Cognitive communication deficit: Secondary | ICD-10-CM | POA: Diagnosis not present

## 2017-12-26 ENCOUNTER — Other Ambulatory Visit: Payer: Self-pay

## 2017-12-26 DIAGNOSIS — R262 Difficulty in walking, not elsewhere classified: Secondary | ICD-10-CM | POA: Diagnosis not present

## 2017-12-26 DIAGNOSIS — G9341 Metabolic encephalopathy: Secondary | ICD-10-CM | POA: Diagnosis not present

## 2017-12-26 DIAGNOSIS — I4891 Unspecified atrial fibrillation: Secondary | ICD-10-CM | POA: Diagnosis not present

## 2017-12-26 DIAGNOSIS — R1311 Dysphagia, oral phase: Secondary | ICD-10-CM | POA: Diagnosis not present

## 2017-12-26 DIAGNOSIS — R41841 Cognitive communication deficit: Secondary | ICD-10-CM | POA: Diagnosis not present

## 2017-12-26 DIAGNOSIS — M6281 Muscle weakness (generalized): Secondary | ICD-10-CM | POA: Diagnosis not present

## 2017-12-26 DIAGNOSIS — N390 Urinary tract infection, site not specified: Secondary | ICD-10-CM | POA: Diagnosis not present

## 2017-12-26 NOTE — Patient Outreach (Signed)
[facility] Care Management  12/26/2017  Larene Picketthelma D Wirt 03/06/1927 [id number]   Telephone call to patient.  Daughter Junious DresserJanet Hoak answers. She is able to verify HIPAA. She states that her [facility] and is receiving [facility]. Advised daughter to talk with facility social worker to verify patient skilled status.  She verbalized understanding.    Plan: RN CM will close case and notify care management assistant of case status.    Bary Lericheionne J Astor Gentle, RN, MSN [facility] Care Management Care Management Coordinator Direct Line [phone] Toll Free: [phone]  Fax: [phone]

## 2017-12-27 DIAGNOSIS — R1311 Dysphagia, oral phase: Secondary | ICD-10-CM | POA: Diagnosis not present

## 2017-12-27 DIAGNOSIS — M6281 Muscle weakness (generalized): Secondary | ICD-10-CM | POA: Diagnosis not present

## 2017-12-27 DIAGNOSIS — G9341 Metabolic encephalopathy: Secondary | ICD-10-CM | POA: Diagnosis not present

## 2017-12-27 DIAGNOSIS — R262 Difficulty in walking, not elsewhere classified: Secondary | ICD-10-CM | POA: Diagnosis not present

## 2017-12-27 DIAGNOSIS — I4891 Unspecified atrial fibrillation: Secondary | ICD-10-CM | POA: Diagnosis not present

## 2017-12-27 DIAGNOSIS — R41841 Cognitive communication deficit: Secondary | ICD-10-CM | POA: Diagnosis not present

## 2017-12-27 DIAGNOSIS — N390 Urinary tract infection, site not specified: Secondary | ICD-10-CM | POA: Diagnosis not present

## 2017-12-28 DIAGNOSIS — I4891 Unspecified atrial fibrillation: Secondary | ICD-10-CM | POA: Diagnosis not present

## 2017-12-28 DIAGNOSIS — Z7901 Long term (current) use of anticoagulants: Secondary | ICD-10-CM | POA: Diagnosis not present

## 2017-12-31 DIAGNOSIS — I119 Hypertensive heart disease without heart failure: Secondary | ICD-10-CM | POA: Diagnosis not present

## 2017-12-31 DIAGNOSIS — L89311 Pressure ulcer of right buttock, stage 1: Secondary | ICD-10-CM | POA: Diagnosis not present

## 2017-12-31 DIAGNOSIS — S6991XD Unspecified injury of right wrist, hand and finger(s), subsequent encounter: Secondary | ICD-10-CM | POA: Diagnosis not present

## 2017-12-31 DIAGNOSIS — I700 Atherosclerosis of aorta: Secondary | ICD-10-CM | POA: Diagnosis not present

## 2017-12-31 DIAGNOSIS — I4891 Unspecified atrial fibrillation: Secondary | ICD-10-CM | POA: Diagnosis not present

## 2017-12-31 DIAGNOSIS — F0391 Unspecified dementia with behavioral disturbance: Secondary | ICD-10-CM | POA: Diagnosis not present

## 2018-01-01 ENCOUNTER — Ambulatory Visit (INDEPENDENT_AMBULATORY_CARE_PROVIDER_SITE_OTHER): Payer: Medicare HMO | Admitting: Pharmacist

## 2018-01-01 DIAGNOSIS — I482 Chronic atrial fibrillation, unspecified: Secondary | ICD-10-CM

## 2018-01-01 DIAGNOSIS — Z7901 Long term (current) use of anticoagulants: Secondary | ICD-10-CM

## 2018-01-02 ENCOUNTER — Telehealth: Payer: Self-pay | Admitting: Cardiology

## 2018-01-02 ENCOUNTER — Ambulatory Visit (INDEPENDENT_AMBULATORY_CARE_PROVIDER_SITE_OTHER): Payer: Medicare HMO | Admitting: *Deleted

## 2018-01-02 DIAGNOSIS — R001 Bradycardia, unspecified: Secondary | ICD-10-CM | POA: Diagnosis not present

## 2018-01-02 DIAGNOSIS — F0391 Unspecified dementia with behavioral disturbance: Secondary | ICD-10-CM | POA: Diagnosis not present

## 2018-01-02 DIAGNOSIS — I700 Atherosclerosis of aorta: Secondary | ICD-10-CM | POA: Diagnosis not present

## 2018-01-02 DIAGNOSIS — I4891 Unspecified atrial fibrillation: Secondary | ICD-10-CM | POA: Diagnosis not present

## 2018-01-02 DIAGNOSIS — I119 Hypertensive heart disease without heart failure: Secondary | ICD-10-CM | POA: Diagnosis not present

## 2018-01-02 DIAGNOSIS — S6991XD Unspecified injury of right wrist, hand and finger(s), subsequent encounter: Secondary | ICD-10-CM | POA: Diagnosis not present

## 2018-01-02 DIAGNOSIS — L89311 Pressure ulcer of right buttock, stage 1: Secondary | ICD-10-CM | POA: Diagnosis not present

## 2018-01-02 LAB — CUP PACEART REMOTE DEVICE CHECK
Battery Remaining Longevity: 120 mo
Battery Voltage: 2.79 V
Brady Statistic RV Percent Paced: 32 %
Date Time Interrogation Session: 20190205192710
Implantable Lead Implant Date: 20071023
Implantable Lead Location: 753860
Implantable Lead Model: 4092
Implantable Lead Model: 5594
Implantable Pulse Generator Implant Date: 20140808
Lead Channel Impedance Value: 805 Ohm
Lead Channel Pacing Threshold Pulse Width: 0.4 ms
Lead Channel Setting Pacing Amplitude: 2 V
Lead Channel Setting Pacing Pulse Width: 0.4 ms
Lead Channel Setting Sensing Sensitivity: 5.6 mV
MDC IDC LEAD IMPLANT DT: 20071023
MDC IDC LEAD LOCATION: 753859
MDC IDC MSMT BATTERY IMPEDANCE: 296 Ohm
MDC IDC MSMT LEADCHNL RA IMPEDANCE VALUE: 67 Ohm
MDC IDC MSMT LEADCHNL RV PACING THRESHOLD AMPLITUDE: 0.5 V

## 2018-01-02 NOTE — Telephone Encounter (Signed)
Confirmed remote transmission w/ pt daughter.   

## 2018-01-02 NOTE — Progress Notes (Signed)
Remote pacemaker transmission.   

## 2018-01-03 ENCOUNTER — Encounter: Payer: Self-pay | Admitting: Cardiology

## 2018-01-03 ENCOUNTER — Ambulatory Visit (INDEPENDENT_AMBULATORY_CARE_PROVIDER_SITE_OTHER): Payer: Medicare HMO | Admitting: Pharmacist

## 2018-01-03 DIAGNOSIS — I700 Atherosclerosis of aorta: Secondary | ICD-10-CM | POA: Diagnosis not present

## 2018-01-03 DIAGNOSIS — Z7901 Long term (current) use of anticoagulants: Secondary | ICD-10-CM

## 2018-01-03 DIAGNOSIS — I482 Chronic atrial fibrillation, unspecified: Secondary | ICD-10-CM

## 2018-01-03 DIAGNOSIS — L89311 Pressure ulcer of right buttock, stage 1: Secondary | ICD-10-CM | POA: Diagnosis not present

## 2018-01-03 DIAGNOSIS — I119 Hypertensive heart disease without heart failure: Secondary | ICD-10-CM | POA: Diagnosis not present

## 2018-01-03 DIAGNOSIS — F0391 Unspecified dementia with behavioral disturbance: Secondary | ICD-10-CM | POA: Diagnosis not present

## 2018-01-03 DIAGNOSIS — I4891 Unspecified atrial fibrillation: Secondary | ICD-10-CM | POA: Diagnosis not present

## 2018-01-03 DIAGNOSIS — S6991XD Unspecified injury of right wrist, hand and finger(s), subsequent encounter: Secondary | ICD-10-CM | POA: Diagnosis not present

## 2018-01-03 LAB — POCT INR: INR: 1.9

## 2018-01-04 DIAGNOSIS — L89311 Pressure ulcer of right buttock, stage 1: Secondary | ICD-10-CM | POA: Diagnosis not present

## 2018-01-04 DIAGNOSIS — I4891 Unspecified atrial fibrillation: Secondary | ICD-10-CM | POA: Diagnosis not present

## 2018-01-04 DIAGNOSIS — I700 Atherosclerosis of aorta: Secondary | ICD-10-CM | POA: Diagnosis not present

## 2018-01-04 DIAGNOSIS — S6991XD Unspecified injury of right wrist, hand and finger(s), subsequent encounter: Secondary | ICD-10-CM | POA: Diagnosis not present

## 2018-01-04 DIAGNOSIS — F0391 Unspecified dementia with behavioral disturbance: Secondary | ICD-10-CM | POA: Diagnosis not present

## 2018-01-04 DIAGNOSIS — I119 Hypertensive heart disease without heart failure: Secondary | ICD-10-CM | POA: Diagnosis not present

## 2018-01-09 DIAGNOSIS — I119 Hypertensive heart disease without heart failure: Secondary | ICD-10-CM | POA: Diagnosis not present

## 2018-01-09 DIAGNOSIS — I700 Atherosclerosis of aorta: Secondary | ICD-10-CM | POA: Diagnosis not present

## 2018-01-09 DIAGNOSIS — I4891 Unspecified atrial fibrillation: Secondary | ICD-10-CM | POA: Diagnosis not present

## 2018-01-09 DIAGNOSIS — S6991XD Unspecified injury of right wrist, hand and finger(s), subsequent encounter: Secondary | ICD-10-CM | POA: Diagnosis not present

## 2018-01-09 DIAGNOSIS — L89311 Pressure ulcer of right buttock, stage 1: Secondary | ICD-10-CM | POA: Diagnosis not present

## 2018-01-09 DIAGNOSIS — F0391 Unspecified dementia with behavioral disturbance: Secondary | ICD-10-CM | POA: Diagnosis not present

## 2018-01-10 ENCOUNTER — Other Ambulatory Visit (HOSPITAL_COMMUNITY): Payer: Self-pay | Admitting: Internal Medicine

## 2018-01-10 ENCOUNTER — Ambulatory Visit (HOSPITAL_COMMUNITY)
Admission: RE | Admit: 2018-01-10 | Discharge: 2018-01-10 | Disposition: A | Discharge status: Home / Self Care | Payer: Medicare HMO | Source: Ambulatory Visit | Attending: Internal Medicine | Admitting: Internal Medicine

## 2018-01-10 DIAGNOSIS — M4186 Other forms of scoliosis, lumbar region: Secondary | ICD-10-CM | POA: Diagnosis not present

## 2018-01-10 DIAGNOSIS — M549 Dorsalgia, unspecified: Secondary | ICD-10-CM | POA: Diagnosis not present

## 2018-01-10 DIAGNOSIS — M47816 Spondylosis without myelopathy or radiculopathy, lumbar region: Secondary | ICD-10-CM | POA: Insufficient documentation

## 2018-01-10 DIAGNOSIS — G301 Alzheimer's disease with late onset: Secondary | ICD-10-CM | POA: Diagnosis not present

## 2018-01-10 DIAGNOSIS — M545 Low back pain: Secondary | ICD-10-CM | POA: Diagnosis not present

## 2018-01-10 DIAGNOSIS — I5022 Chronic systolic (congestive) heart failure: Secondary | ICD-10-CM | POA: Diagnosis not present

## 2018-01-10 DIAGNOSIS — R296 Repeated falls: Secondary | ICD-10-CM | POA: Diagnosis not present

## 2018-01-10 DIAGNOSIS — G9341 Metabolic encephalopathy: Secondary | ICD-10-CM | POA: Diagnosis not present

## 2018-01-11 DIAGNOSIS — F0391 Unspecified dementia with behavioral disturbance: Secondary | ICD-10-CM | POA: Diagnosis not present

## 2018-01-11 DIAGNOSIS — L89311 Pressure ulcer of right buttock, stage 1: Secondary | ICD-10-CM | POA: Diagnosis not present

## 2018-01-11 DIAGNOSIS — S6991XD Unspecified injury of right wrist, hand and finger(s), subsequent encounter: Secondary | ICD-10-CM | POA: Diagnosis not present

## 2018-01-11 DIAGNOSIS — I700 Atherosclerosis of aorta: Secondary | ICD-10-CM | POA: Diagnosis not present

## 2018-01-11 DIAGNOSIS — I119 Hypertensive heart disease without heart failure: Secondary | ICD-10-CM | POA: Diagnosis not present

## 2018-01-11 DIAGNOSIS — I4891 Unspecified atrial fibrillation: Secondary | ICD-10-CM | POA: Diagnosis not present

## 2018-01-12 DIAGNOSIS — I4891 Unspecified atrial fibrillation: Secondary | ICD-10-CM | POA: Diagnosis not present

## 2018-01-12 DIAGNOSIS — I119 Hypertensive heart disease without heart failure: Secondary | ICD-10-CM | POA: Diagnosis not present

## 2018-01-12 DIAGNOSIS — F0391 Unspecified dementia with behavioral disturbance: Secondary | ICD-10-CM | POA: Diagnosis not present

## 2018-01-12 DIAGNOSIS — L89311 Pressure ulcer of right buttock, stage 1: Secondary | ICD-10-CM | POA: Diagnosis not present

## 2018-01-12 DIAGNOSIS — I700 Atherosclerosis of aorta: Secondary | ICD-10-CM | POA: Diagnosis not present

## 2018-01-12 DIAGNOSIS — S6991XD Unspecified injury of right wrist, hand and finger(s), subsequent encounter: Secondary | ICD-10-CM | POA: Diagnosis not present

## 2018-01-15 DIAGNOSIS — L89311 Pressure ulcer of right buttock, stage 1: Secondary | ICD-10-CM | POA: Diagnosis not present

## 2018-01-15 DIAGNOSIS — I119 Hypertensive heart disease without heart failure: Secondary | ICD-10-CM | POA: Diagnosis not present

## 2018-01-15 DIAGNOSIS — F0391 Unspecified dementia with behavioral disturbance: Secondary | ICD-10-CM | POA: Diagnosis not present

## 2018-01-15 DIAGNOSIS — I4891 Unspecified atrial fibrillation: Secondary | ICD-10-CM | POA: Diagnosis not present

## 2018-01-15 DIAGNOSIS — I700 Atherosclerosis of aorta: Secondary | ICD-10-CM | POA: Diagnosis not present

## 2018-01-15 DIAGNOSIS — S6991XD Unspecified injury of right wrist, hand and finger(s), subsequent encounter: Secondary | ICD-10-CM | POA: Diagnosis not present

## 2018-01-16 ENCOUNTER — Ambulatory Visit (INDEPENDENT_AMBULATORY_CARE_PROVIDER_SITE_OTHER): Payer: Medicare HMO | Admitting: Pharmacist Clinician (PhC)/ Clinical Pharmacy Specialist

## 2018-01-16 DIAGNOSIS — I700 Atherosclerosis of aorta: Secondary | ICD-10-CM | POA: Diagnosis not present

## 2018-01-16 DIAGNOSIS — S6991XD Unspecified injury of right wrist, hand and finger(s), subsequent encounter: Secondary | ICD-10-CM | POA: Diagnosis not present

## 2018-01-16 DIAGNOSIS — I482 Chronic atrial fibrillation, unspecified: Secondary | ICD-10-CM

## 2018-01-16 DIAGNOSIS — Z7901 Long term (current) use of anticoagulants: Secondary | ICD-10-CM

## 2018-01-16 DIAGNOSIS — L89311 Pressure ulcer of right buttock, stage 1: Secondary | ICD-10-CM | POA: Diagnosis not present

## 2018-01-16 DIAGNOSIS — F0391 Unspecified dementia with behavioral disturbance: Secondary | ICD-10-CM | POA: Diagnosis not present

## 2018-01-16 DIAGNOSIS — I119 Hypertensive heart disease without heart failure: Secondary | ICD-10-CM | POA: Diagnosis not present

## 2018-01-16 DIAGNOSIS — I4891 Unspecified atrial fibrillation: Secondary | ICD-10-CM | POA: Diagnosis not present

## 2018-01-16 LAB — PROTIME-INR: INR: 3.4 — AB (ref <=1.1)

## 2018-01-18 DIAGNOSIS — I700 Atherosclerosis of aorta: Secondary | ICD-10-CM | POA: Diagnosis not present

## 2018-01-18 DIAGNOSIS — L89311 Pressure ulcer of right buttock, stage 1: Secondary | ICD-10-CM | POA: Diagnosis not present

## 2018-01-18 DIAGNOSIS — I4891 Unspecified atrial fibrillation: Secondary | ICD-10-CM | POA: Diagnosis not present

## 2018-01-18 DIAGNOSIS — S6991XD Unspecified injury of right wrist, hand and finger(s), subsequent encounter: Secondary | ICD-10-CM | POA: Diagnosis not present

## 2018-01-18 DIAGNOSIS — F0391 Unspecified dementia with behavioral disturbance: Secondary | ICD-10-CM | POA: Diagnosis not present

## 2018-01-18 DIAGNOSIS — I119 Hypertensive heart disease without heart failure: Secondary | ICD-10-CM | POA: Diagnosis not present

## 2018-01-22 DIAGNOSIS — I4891 Unspecified atrial fibrillation: Secondary | ICD-10-CM | POA: Diagnosis not present

## 2018-01-22 DIAGNOSIS — F0391 Unspecified dementia with behavioral disturbance: Secondary | ICD-10-CM | POA: Diagnosis not present

## 2018-01-22 DIAGNOSIS — I700 Atherosclerosis of aorta: Secondary | ICD-10-CM | POA: Diagnosis not present

## 2018-01-22 DIAGNOSIS — L89311 Pressure ulcer of right buttock, stage 1: Secondary | ICD-10-CM | POA: Diagnosis not present

## 2018-01-22 DIAGNOSIS — I119 Hypertensive heart disease without heart failure: Secondary | ICD-10-CM | POA: Diagnosis not present

## 2018-01-22 DIAGNOSIS — S6991XD Unspecified injury of right wrist, hand and finger(s), subsequent encounter: Secondary | ICD-10-CM | POA: Diagnosis not present

## 2018-01-23 DIAGNOSIS — S6991XD Unspecified injury of right wrist, hand and finger(s), subsequent encounter: Secondary | ICD-10-CM | POA: Diagnosis not present

## 2018-01-23 DIAGNOSIS — I4891 Unspecified atrial fibrillation: Secondary | ICD-10-CM | POA: Diagnosis not present

## 2018-01-23 DIAGNOSIS — I119 Hypertensive heart disease without heart failure: Secondary | ICD-10-CM | POA: Diagnosis not present

## 2018-01-23 DIAGNOSIS — I700 Atherosclerosis of aorta: Secondary | ICD-10-CM | POA: Diagnosis not present

## 2018-01-23 DIAGNOSIS — F0391 Unspecified dementia with behavioral disturbance: Secondary | ICD-10-CM | POA: Diagnosis not present

## 2018-01-23 DIAGNOSIS — L89311 Pressure ulcer of right buttock, stage 1: Secondary | ICD-10-CM | POA: Diagnosis not present

## 2018-01-24 ENCOUNTER — Ambulatory Visit: Payer: Medicare HMO | Admitting: Cardiovascular Disease

## 2018-01-24 ENCOUNTER — Ambulatory Visit (INDEPENDENT_AMBULATORY_CARE_PROVIDER_SITE_OTHER): Payer: Medicare HMO | Admitting: Pharmacist

## 2018-01-24 ENCOUNTER — Encounter: Payer: Self-pay | Admitting: Cardiovascular Disease

## 2018-01-24 VITALS — BP 118/78 | HR 69 | Wt 156.0 lb

## 2018-01-24 DIAGNOSIS — Z79899 Other long term (current) drug therapy: Secondary | ICD-10-CM | POA: Diagnosis not present

## 2018-01-24 DIAGNOSIS — I340 Nonrheumatic mitral (valve) insufficiency: Secondary | ICD-10-CM

## 2018-01-24 DIAGNOSIS — I482 Chronic atrial fibrillation, unspecified: Secondary | ICD-10-CM

## 2018-01-24 DIAGNOSIS — E785 Hyperlipidemia, unspecified: Secondary | ICD-10-CM | POA: Diagnosis not present

## 2018-01-24 DIAGNOSIS — F0390 Unspecified dementia without behavioral disturbance: Secondary | ICD-10-CM | POA: Diagnosis not present

## 2018-01-24 DIAGNOSIS — Z950 Presence of cardiac pacemaker: Secondary | ICD-10-CM | POA: Diagnosis not present

## 2018-01-24 DIAGNOSIS — Z7901 Long term (current) use of anticoagulants: Secondary | ICD-10-CM

## 2018-01-24 DIAGNOSIS — I4821 Permanent atrial fibrillation: Secondary | ICD-10-CM

## 2018-01-24 LAB — LIPID PANEL
CHOL/HDL RATIO: 2.5 ratio (ref 0.0–4.4)
CHOLESTEROL TOTAL: 146 mg/dL (ref 100–199)
HDL: 59 mg/dL (ref >39)
LDL CALC: 70 mg/dL (ref 0–99)
TRIGLYCERIDES: 87 mg/dL (ref 0–149)
VLDL CHOLESTEROL CAL: 17 mg/dL (ref 5–40)

## 2018-01-24 LAB — POCT INR: INR: 2.5

## 2018-01-24 NOTE — Progress Notes (Signed)
Cardiology Office Note    Date:  01/25/2018   ID:  [name], DOB [birth year], MRN [id number]  PCP:  Avon Gully, MD  Cardiologist:   Thurmon Fair, MD   No chief complaint on file.   History of Present Illness:  [name] is a 84 y.o. female with long-standing permanent atrial fibrillation with slow ventricular response and a single chamber permanent pacemaker, here for routine follow-up. She is is accompanied as always by her daughter.   Her dementia continues to progress.  Almost impossible to get any review of systems.  She does complain about her right hand hurting.  She fell out of her bed and her right arm was caught in a hyperextended position between the bed rail in the bed.  Ever since then she has had limited grasp in her right hand and complains of pain.  She did not have any bleeding and there was no head injury.  She has not had any complaints of chest pain no shortness of breath and is not aware of palpitations.  Her left subclavian pacemaker site appears to be uninjured.  Interrogation of her pacemaker shows that her Medtronic [place]  device implanted in 2014 still has roughly 10 years of generator life expectancy.  She has 32% ventricular pacing.  She often has mild rapid ventricular rate, but it is very rare for her ventricular response to be over 120 bpm.  She did not tolerate higher doses of beta-blockers.   Past Medical History:  Diagnosis Date  . Acid reflux   . Atrial fibrillation (HCC)   . Atrophic vaginitis   . Chronic cystitis   . Decubitus ulcer of buttock   . Dementia   . Hyperlipidemia   . Hypertension   . Incomplete bladder emptying   . Microscopic hematuria   . Pacemaker   . Straining on urination   . Symptomatic bradycardia   . Urge incontinence of urine   . UTI (lower urinary tract infection)     Past Surgical History:  Procedure Laterality Date  . CARDIAC CATHETERIZATION  04/2004  . PACEMAKER GENERATOR CHANGE N/A 07/05/2013   Procedure: PACEMAKER GENERATOR CHANGE;  Surgeon: Thurmon Fair, MD;  Location: [facility];  Service: Cardiovascular;  Laterality: N/A;  . PACEMAKER INSERTION      Current Medications: Outpatient Medications Prior to Visit  Medication Sig Dispense Refill  . furosemide (LASIX) 40 MG tablet Take 40 mg by mouth daily.     Marland Kitchen loratadine (CLARITIN) 10 MG tablet Take 10 mg by mouth daily as needed for allergies.    . metoprolol succinate (TOPROL-XL) 25 MG 24 hr tablet TAKE 1 AND 1/2 TABLETS BY MOUTH ONCE DAILY. 135 tablet 0  . Multiple Vitamins-Minerals (CENTRUM SILVER PO) Take 1 tablet by mouth daily.      Marland Kitchen omeprazole (PRILOSEC) 20 MG capsule Take 20 mg by mouth daily.      . potassium chloride SA (K-DUR,KLOR-CON) 20 MEQ tablet Take 1 tablet by mouth daily.    . simvastatin (ZOCOR) 10 MG tablet Take 10 mg by mouth daily.    . Travoprost, BAK Free, (TRAVATAN Z) 0.004 % SOLN ophthalmic solution Place 1 drop into both eyes at bedtime.    Marland Kitchen warfarin (COUMADIN) 3 MG tablet TAKE 1 TO 1 AND 1/2 TABLET BY MOUTH DAILY AS DIRECTED. (Patient taking differently: TAKE 1 TABLET BY MOUTH DAILY except takes 1/2 tablet (1.5mg ) on Mondays.Marland Kitchen) 120 tablet 8   No facility-administered medications prior to visit.  Allergies:   Contrast media [iodinated diagnostic agents]; Penicillins; and Sulfa antibiotics   Social History   Socioeconomic History  . Marital status: Widowed    Spouse name: None  . Number of children: None  . Years of education: None  . Highest education level: None  Social Needs  . Financial resource strain: None  . Food insecurity - worry: None  . Food insecurity - inability: None  . Transportation needs - medical: None  . Transportation needs - non-medical: None  Occupational History  . None  Tobacco Use  . Smoking status: Never Smoker  . Smokeless tobacco: Never Used  Substance and Sexual Activity  . Alcohol use: No  . Drug use: No  . Sexual activity: No  Other Topics Concern   . None  Social History Narrative  . None     Family History:  The patient's family history includes Cancer in her other.   ROS:   Please see the history of present illness.    ROS All other systems reviewed and are negative.   PHYSICAL EXAM:   VS:  BP 118/78   Pulse 69   Wt 156 lb (70.8 kg)   BMI 25.18 kg/m     General: Alert, disoriented, no distress, limited ability to interact Head: no evidence of trauma, PERRL, EOMI, no exophtalmos or lid lag, no myxedema, no xanthelasma; normal ears, nose and oropharynx Neck: normal jugular venous pulsations and no hepatojugular reflux; brisk carotid pulses without delay and no carotid bruits Chest: clear to auscultation, no signs of consolidation by percussion or palpation, normal fremitus, symmetrical and full respiratory excursions.  Healthy left subclavian pacemaker site Cardiovascular: normal position and quality of the apical impulse, regular rhythm, normal first and second heart sounds, 2/6 holosystolic apical murmur , no diastolic murmurs, rubs or gallops Abdomen: no tenderness or distention, no masses by palpation, no abnormal pulsatility or arterial bruits, normal bowel sounds, no hepatosplenomegaly Extremities: no clubbing, cyanosis or edema; 2+ radial, ulnar and brachial pulses bilaterally; 2+ right femoral, posterior tibial and dorsalis pedis pulses; 2+ left femoral, posterior tibial and dorsalis pedis pulses; no subclavian or femoral bruits Neurological: She does not really cooperate with an attempt at a neurological exam.  She has tenderness upon palpation over the metacarpals, but I do not find any obvious evidence of fracture.  She has difficulty grasping.   Psych: Normal mood and affect   Wt Readings from Last 3 Encounters:  01/24/18 156 lb (70.8 kg)  12/06/17 149 lb 4 oz (67.7 kg)  09/12/17 164 lb (74.4 kg)      Studies/Labs Reviewed:   EKG:  EKG is not ordered today.    ASSESSMENT:    1. Permanent atrial  fibrillation (HCC)   2. Pacemaker   3. Long term current use of anticoagulant   4. Nonrheumatic mitral (valve) insufficiency   5. Dementia without behavioral disturbance, unspecified dementia type   6. Dyslipidemia   7. Medication management      PLAN:  In order of problems listed above:  1. AFib: Ventricular rate control is not perfect, but is acceptable.  She did not tolerate higher beta-blocker doses.  She has only had one fall and did not have any serious impact or head injury.  Continue warfarin. 2. PPM: Mode downloads every 3 months and yearly office visits 3. Warfarin: CHADSVasc 4.  Benefit of warfarin still appears to be favorable but need to periodically reassess this.  The overall risk/ 4. Moderate MR, not a  surgical candidate.  No clinical findings to suggest overt heart failure. 5. Dementia continues to progress  6. HLP: on statin, will reassess labs today. 7. Nerve injury? -Hard to assess but I wonder if she has some nerve elongation/compression injury after her fall.  Suggest evaluation by a neurologist.    Medication Adjustments/Labs and Tests Ordered: Current medicines are reviewed at length with the patient today.  Concerns regarding medicines are outlined above.  Medication changes, Labs and Tests ordered today are listed in the Patient Instructions below. Patient Instructions  Dr Royann Shivers recommends that you continue on your current medications as directed. Please refer to the Current Medication list given to you today.  Your physician recommends that you return for lab work TODAY.  Remote monitoring is used to monitor your Pacemaker or ICD from home. This monitoring reduces the number of office visits required to check your device to one time per year. It allows [place] to keep an eye on the functioning of your device to ensure it is working properly. You are scheduled for a device check from home on Tuesday, May 7th, 2019. You may send your transmission at any time that  day. If you have a wireless device, the transmission will be sent automatically. After your physician reviews your transmission, you will receive a notification with your next transmission date.  Dr Royann Shivers recommends that you schedule a follow-up appointment in 12 months with a pacemaker check. You will receive a reminder letter in the mail two months in advance. If you don't receive a letter, please call our office to schedule the follow-up appointment.  If you need a refill on your cardiac medications before your next appointment, please call your pharmacy.    Signed, Thurmon Fair, MD  01/25/2018 4:38 PM    [facility] [address] [place], [place], [place]  [address] Phone: [phone]; Fax: [phone]

## 2018-01-24 NOTE — Patient Instructions (Signed)
Dr Royann Shiversroitoru recommends that you continue on your current medications as directed. Please refer to the Current Medication list given to you today.  Your physician recommends that you return for lab work TODAY.  Remote monitoring is used to monitor your Pacemaker or ICD from home. This monitoring reduces the number of office visits required to check your device to one time per year. It allows us to keep an eye on the functioning of your device to ensure it is working properly. You are scheduled for a device check from home on Tuesday, May 7th, 2019. You may send your transmission at any time that day. If you have a wireless device, the transmission will be sent automatically. After your physician reviews your transmission, you will receive a notification with your next transmission date.  Dr Royann Shiversroitoru recommends that you schedule a follow-up appointment in 12 months with a pacemaker check. You will receive a reminder letter in the mail two months in advance. If you don't receive a letter, please call our office to schedule the follow-up appointment.  If you need a refill on your cardiac medications before your next appointment, please call your pharmacy.

## 2018-01-25 DIAGNOSIS — F0391 Unspecified dementia with behavioral disturbance: Secondary | ICD-10-CM | POA: Diagnosis not present

## 2018-01-25 DIAGNOSIS — I700 Atherosclerosis of aorta: Secondary | ICD-10-CM | POA: Diagnosis not present

## 2018-01-25 DIAGNOSIS — S6991XD Unspecified injury of right wrist, hand and finger(s), subsequent encounter: Secondary | ICD-10-CM | POA: Diagnosis not present

## 2018-01-25 DIAGNOSIS — I4891 Unspecified atrial fibrillation: Secondary | ICD-10-CM | POA: Diagnosis not present

## 2018-01-25 DIAGNOSIS — L89311 Pressure ulcer of right buttock, stage 1: Secondary | ICD-10-CM | POA: Diagnosis not present

## 2018-01-25 DIAGNOSIS — I119 Hypertensive heart disease without heart failure: Secondary | ICD-10-CM | POA: Diagnosis not present

## 2018-01-30 DIAGNOSIS — L89311 Pressure ulcer of right buttock, stage 1: Secondary | ICD-10-CM | POA: Diagnosis not present

## 2018-01-30 DIAGNOSIS — F0391 Unspecified dementia with behavioral disturbance: Secondary | ICD-10-CM | POA: Diagnosis not present

## 2018-01-30 DIAGNOSIS — I700 Atherosclerosis of aorta: Secondary | ICD-10-CM | POA: Diagnosis not present

## 2018-01-30 DIAGNOSIS — I119 Hypertensive heart disease without heart failure: Secondary | ICD-10-CM | POA: Diagnosis not present

## 2018-01-30 DIAGNOSIS — S6991XD Unspecified injury of right wrist, hand and finger(s), subsequent encounter: Secondary | ICD-10-CM | POA: Diagnosis not present

## 2018-01-30 DIAGNOSIS — I4891 Unspecified atrial fibrillation: Secondary | ICD-10-CM | POA: Diagnosis not present

## 2018-01-31 ENCOUNTER — Ambulatory Visit (INDEPENDENT_AMBULATORY_CARE_PROVIDER_SITE_OTHER): Payer: Medicare HMO | Admitting: Pharmacist

## 2018-01-31 DIAGNOSIS — Z7901 Long term (current) use of anticoagulants: Secondary | ICD-10-CM

## 2018-01-31 DIAGNOSIS — I482 Chronic atrial fibrillation: Secondary | ICD-10-CM

## 2018-01-31 DIAGNOSIS — L89311 Pressure ulcer of right buttock, stage 1: Secondary | ICD-10-CM | POA: Diagnosis not present

## 2018-01-31 DIAGNOSIS — I119 Hypertensive heart disease without heart failure: Secondary | ICD-10-CM | POA: Diagnosis not present

## 2018-01-31 DIAGNOSIS — F0391 Unspecified dementia with behavioral disturbance: Secondary | ICD-10-CM | POA: Diagnosis not present

## 2018-01-31 DIAGNOSIS — I4891 Unspecified atrial fibrillation: Secondary | ICD-10-CM | POA: Diagnosis not present

## 2018-01-31 DIAGNOSIS — I4821 Permanent atrial fibrillation: Secondary | ICD-10-CM

## 2018-01-31 DIAGNOSIS — S6991XD Unspecified injury of right wrist, hand and finger(s), subsequent encounter: Secondary | ICD-10-CM | POA: Diagnosis not present

## 2018-01-31 DIAGNOSIS — I700 Atherosclerosis of aorta: Secondary | ICD-10-CM | POA: Diagnosis not present

## 2018-01-31 LAB — POCT INR: INR: 2.7

## 2018-02-01 DIAGNOSIS — I4891 Unspecified atrial fibrillation: Secondary | ICD-10-CM | POA: Diagnosis not present

## 2018-02-01 DIAGNOSIS — I119 Hypertensive heart disease without heart failure: Secondary | ICD-10-CM | POA: Diagnosis not present

## 2018-02-01 DIAGNOSIS — I700 Atherosclerosis of aorta: Secondary | ICD-10-CM | POA: Diagnosis not present

## 2018-02-01 DIAGNOSIS — L89311 Pressure ulcer of right buttock, stage 1: Secondary | ICD-10-CM | POA: Diagnosis not present

## 2018-02-01 DIAGNOSIS — F0391 Unspecified dementia with behavioral disturbance: Secondary | ICD-10-CM | POA: Diagnosis not present

## 2018-02-01 DIAGNOSIS — S6991XD Unspecified injury of right wrist, hand and finger(s), subsequent encounter: Secondary | ICD-10-CM | POA: Diagnosis not present

## 2018-02-06 DIAGNOSIS — I4891 Unspecified atrial fibrillation: Secondary | ICD-10-CM | POA: Diagnosis not present

## 2018-02-06 DIAGNOSIS — I119 Hypertensive heart disease without heart failure: Secondary | ICD-10-CM | POA: Diagnosis not present

## 2018-02-06 DIAGNOSIS — S6991XD Unspecified injury of right wrist, hand and finger(s), subsequent encounter: Secondary | ICD-10-CM | POA: Diagnosis not present

## 2018-02-06 DIAGNOSIS — I700 Atherosclerosis of aorta: Secondary | ICD-10-CM | POA: Diagnosis not present

## 2018-02-06 DIAGNOSIS — F0391 Unspecified dementia with behavioral disturbance: Secondary | ICD-10-CM | POA: Diagnosis not present

## 2018-02-06 DIAGNOSIS — L89311 Pressure ulcer of right buttock, stage 1: Secondary | ICD-10-CM | POA: Diagnosis not present

## 2018-02-07 LAB — CUP PACEART INCLINIC DEVICE CHECK
Date Time Interrogation Session: 20190313090510
Implantable Lead Implant Date: 20071023
Implantable Lead Location: 753859
Implantable Lead Location: 753860
Implantable Pulse Generator Implant Date: 20140808
MDC IDC LEAD IMPLANT DT: 20071023
MDC IDC SET LEADCHNL RV PACING AMPLITUDE: 2 V
MDC IDC SET LEADCHNL RV PACING PULSEWIDTH: 0.4 ms
MDC IDC SET LEADCHNL RV SENSING SENSITIVITY: 5.6 mV

## 2018-02-08 DIAGNOSIS — I4891 Unspecified atrial fibrillation: Secondary | ICD-10-CM | POA: Diagnosis not present

## 2018-02-08 DIAGNOSIS — I119 Hypertensive heart disease without heart failure: Secondary | ICD-10-CM | POA: Diagnosis not present

## 2018-02-08 DIAGNOSIS — L89311 Pressure ulcer of right buttock, stage 1: Secondary | ICD-10-CM | POA: Diagnosis not present

## 2018-02-08 DIAGNOSIS — I700 Atherosclerosis of aorta: Secondary | ICD-10-CM | POA: Diagnosis not present

## 2018-02-08 DIAGNOSIS — F0391 Unspecified dementia with behavioral disturbance: Secondary | ICD-10-CM | POA: Diagnosis not present

## 2018-02-08 DIAGNOSIS — S6991XD Unspecified injury of right wrist, hand and finger(s), subsequent encounter: Secondary | ICD-10-CM | POA: Diagnosis not present

## 2018-02-09 DIAGNOSIS — I4891 Unspecified atrial fibrillation: Secondary | ICD-10-CM | POA: Diagnosis not present

## 2018-02-09 DIAGNOSIS — S6991XD Unspecified injury of right wrist, hand and finger(s), subsequent encounter: Secondary | ICD-10-CM | POA: Diagnosis not present

## 2018-02-09 DIAGNOSIS — I700 Atherosclerosis of aorta: Secondary | ICD-10-CM | POA: Diagnosis not present

## 2018-02-09 DIAGNOSIS — L89311 Pressure ulcer of right buttock, stage 1: Secondary | ICD-10-CM | POA: Diagnosis not present

## 2018-02-09 DIAGNOSIS — I119 Hypertensive heart disease without heart failure: Secondary | ICD-10-CM | POA: Diagnosis not present

## 2018-02-09 DIAGNOSIS — F0391 Unspecified dementia with behavioral disturbance: Secondary | ICD-10-CM | POA: Diagnosis not present

## 2018-02-11 DIAGNOSIS — I700 Atherosclerosis of aorta: Secondary | ICD-10-CM | POA: Diagnosis not present

## 2018-02-11 DIAGNOSIS — I4891 Unspecified atrial fibrillation: Secondary | ICD-10-CM | POA: Diagnosis not present

## 2018-02-11 DIAGNOSIS — I119 Hypertensive heart disease without heart failure: Secondary | ICD-10-CM | POA: Diagnosis not present

## 2018-02-11 DIAGNOSIS — L89311 Pressure ulcer of right buttock, stage 1: Secondary | ICD-10-CM | POA: Diagnosis not present

## 2018-02-11 DIAGNOSIS — S6991XD Unspecified injury of right wrist, hand and finger(s), subsequent encounter: Secondary | ICD-10-CM | POA: Diagnosis not present

## 2018-02-11 DIAGNOSIS — F0391 Unspecified dementia with behavioral disturbance: Secondary | ICD-10-CM | POA: Diagnosis not present

## 2018-02-13 DIAGNOSIS — L89311 Pressure ulcer of right buttock, stage 1: Secondary | ICD-10-CM | POA: Diagnosis not present

## 2018-02-13 DIAGNOSIS — F0391 Unspecified dementia with behavioral disturbance: Secondary | ICD-10-CM | POA: Diagnosis not present

## 2018-02-13 DIAGNOSIS — I4891 Unspecified atrial fibrillation: Secondary | ICD-10-CM | POA: Diagnosis not present

## 2018-02-13 DIAGNOSIS — I700 Atherosclerosis of aorta: Secondary | ICD-10-CM | POA: Diagnosis not present

## 2018-02-13 DIAGNOSIS — I119 Hypertensive heart disease without heart failure: Secondary | ICD-10-CM | POA: Diagnosis not present

## 2018-02-13 DIAGNOSIS — S6991XD Unspecified injury of right wrist, hand and finger(s), subsequent encounter: Secondary | ICD-10-CM | POA: Diagnosis not present

## 2018-02-14 ENCOUNTER — Ambulatory Visit (INDEPENDENT_AMBULATORY_CARE_PROVIDER_SITE_OTHER): Payer: Medicare HMO | Admitting: Pharmacist

## 2018-02-14 DIAGNOSIS — I700 Atherosclerosis of aorta: Secondary | ICD-10-CM | POA: Diagnosis not present

## 2018-02-14 DIAGNOSIS — I119 Hypertensive heart disease without heart failure: Secondary | ICD-10-CM | POA: Diagnosis not present

## 2018-02-14 DIAGNOSIS — I482 Chronic atrial fibrillation: Secondary | ICD-10-CM

## 2018-02-14 DIAGNOSIS — S6991XD Unspecified injury of right wrist, hand and finger(s), subsequent encounter: Secondary | ICD-10-CM | POA: Diagnosis not present

## 2018-02-14 DIAGNOSIS — Z7901 Long term (current) use of anticoagulants: Secondary | ICD-10-CM | POA: Diagnosis not present

## 2018-02-14 DIAGNOSIS — F0391 Unspecified dementia with behavioral disturbance: Secondary | ICD-10-CM | POA: Diagnosis not present

## 2018-02-14 DIAGNOSIS — I4891 Unspecified atrial fibrillation: Secondary | ICD-10-CM | POA: Diagnosis not present

## 2018-02-14 DIAGNOSIS — I4821 Permanent atrial fibrillation: Secondary | ICD-10-CM

## 2018-02-14 DIAGNOSIS — L89311 Pressure ulcer of right buttock, stage 1: Secondary | ICD-10-CM | POA: Diagnosis not present

## 2018-02-14 LAB — POCT INR: INR: 2.6

## 2018-02-15 DIAGNOSIS — F0391 Unspecified dementia with behavioral disturbance: Secondary | ICD-10-CM | POA: Diagnosis not present

## 2018-02-15 DIAGNOSIS — L89311 Pressure ulcer of right buttock, stage 1: Secondary | ICD-10-CM | POA: Diagnosis not present

## 2018-02-15 DIAGNOSIS — I119 Hypertensive heart disease without heart failure: Secondary | ICD-10-CM | POA: Diagnosis not present

## 2018-02-15 DIAGNOSIS — S6991XD Unspecified injury of right wrist, hand and finger(s), subsequent encounter: Secondary | ICD-10-CM | POA: Diagnosis not present

## 2018-02-15 DIAGNOSIS — I700 Atherosclerosis of aorta: Secondary | ICD-10-CM | POA: Diagnosis not present

## 2018-02-15 DIAGNOSIS — I4891 Unspecified atrial fibrillation: Secondary | ICD-10-CM | POA: Diagnosis not present

## 2018-02-20 DIAGNOSIS — L89311 Pressure ulcer of right buttock, stage 1: Secondary | ICD-10-CM | POA: Diagnosis not present

## 2018-02-20 DIAGNOSIS — S6991XD Unspecified injury of right wrist, hand and finger(s), subsequent encounter: Secondary | ICD-10-CM | POA: Diagnosis not present

## 2018-02-20 DIAGNOSIS — I4891 Unspecified atrial fibrillation: Secondary | ICD-10-CM | POA: Diagnosis not present

## 2018-02-20 DIAGNOSIS — I700 Atherosclerosis of aorta: Secondary | ICD-10-CM | POA: Diagnosis not present

## 2018-02-20 DIAGNOSIS — F0391 Unspecified dementia with behavioral disturbance: Secondary | ICD-10-CM | POA: Diagnosis not present

## 2018-02-20 DIAGNOSIS — I119 Hypertensive heart disease without heart failure: Secondary | ICD-10-CM | POA: Diagnosis not present

## 2018-02-22 DIAGNOSIS — F0391 Unspecified dementia with behavioral disturbance: Secondary | ICD-10-CM | POA: Diagnosis not present

## 2018-02-22 DIAGNOSIS — I700 Atherosclerosis of aorta: Secondary | ICD-10-CM | POA: Diagnosis not present

## 2018-02-22 DIAGNOSIS — L89311 Pressure ulcer of right buttock, stage 1: Secondary | ICD-10-CM | POA: Diagnosis not present

## 2018-02-22 DIAGNOSIS — I4891 Unspecified atrial fibrillation: Secondary | ICD-10-CM | POA: Diagnosis not present

## 2018-02-22 DIAGNOSIS — S6991XD Unspecified injury of right wrist, hand and finger(s), subsequent encounter: Secondary | ICD-10-CM | POA: Diagnosis not present

## 2018-02-22 DIAGNOSIS — I119 Hypertensive heart disease without heart failure: Secondary | ICD-10-CM | POA: Diagnosis not present

## 2018-02-26 DIAGNOSIS — I4891 Unspecified atrial fibrillation: Secondary | ICD-10-CM | POA: Diagnosis not present

## 2018-02-26 DIAGNOSIS — I119 Hypertensive heart disease without heart failure: Secondary | ICD-10-CM | POA: Diagnosis not present

## 2018-02-26 DIAGNOSIS — I700 Atherosclerosis of aorta: Secondary | ICD-10-CM | POA: Diagnosis not present

## 2018-02-26 DIAGNOSIS — S6991XD Unspecified injury of right wrist, hand and finger(s), subsequent encounter: Secondary | ICD-10-CM | POA: Diagnosis not present

## 2018-02-26 DIAGNOSIS — F0391 Unspecified dementia with behavioral disturbance: Secondary | ICD-10-CM | POA: Diagnosis not present

## 2018-02-26 DIAGNOSIS — L89311 Pressure ulcer of right buttock, stage 1: Secondary | ICD-10-CM | POA: Diagnosis not present

## 2018-02-27 DIAGNOSIS — I4891 Unspecified atrial fibrillation: Secondary | ICD-10-CM | POA: Diagnosis not present

## 2018-02-27 DIAGNOSIS — S6991XD Unspecified injury of right wrist, hand and finger(s), subsequent encounter: Secondary | ICD-10-CM | POA: Diagnosis not present

## 2018-02-27 DIAGNOSIS — L89311 Pressure ulcer of right buttock, stage 1: Secondary | ICD-10-CM | POA: Diagnosis not present

## 2018-02-27 DIAGNOSIS — F0391 Unspecified dementia with behavioral disturbance: Secondary | ICD-10-CM | POA: Diagnosis not present

## 2018-02-27 DIAGNOSIS — I700 Atherosclerosis of aorta: Secondary | ICD-10-CM | POA: Diagnosis not present

## 2018-02-27 DIAGNOSIS — I119 Hypertensive heart disease without heart failure: Secondary | ICD-10-CM | POA: Diagnosis not present

## 2018-02-28 DIAGNOSIS — L89311 Pressure ulcer of right buttock, stage 1: Secondary | ICD-10-CM | POA: Diagnosis not present

## 2018-02-28 DIAGNOSIS — F0391 Unspecified dementia with behavioral disturbance: Secondary | ICD-10-CM | POA: Diagnosis not present

## 2018-02-28 DIAGNOSIS — I4891 Unspecified atrial fibrillation: Secondary | ICD-10-CM | POA: Diagnosis not present

## 2018-02-28 DIAGNOSIS — I700 Atherosclerosis of aorta: Secondary | ICD-10-CM | POA: Diagnosis not present

## 2018-02-28 DIAGNOSIS — I119 Hypertensive heart disease without heart failure: Secondary | ICD-10-CM | POA: Diagnosis not present

## 2018-02-28 DIAGNOSIS — S6991XD Unspecified injury of right wrist, hand and finger(s), subsequent encounter: Secondary | ICD-10-CM | POA: Diagnosis not present

## 2018-03-22 ENCOUNTER — Other Ambulatory Visit: Payer: Self-pay | Admitting: Cardiovascular Disease

## 2018-03-22 DIAGNOSIS — Z7901 Long term (current) use of anticoagulants: Secondary | ICD-10-CM | POA: Diagnosis not present

## 2018-03-22 DIAGNOSIS — I4891 Unspecified atrial fibrillation: Secondary | ICD-10-CM | POA: Diagnosis not present

## 2018-03-23 LAB — PROTIME-INR
INR: 2.2 — AB
Prothrombin Time: 23.6 s — ABNORMAL HIGH (ref 9.0–11.5)

## 2018-03-26 ENCOUNTER — Ambulatory Visit (INDEPENDENT_AMBULATORY_CARE_PROVIDER_SITE_OTHER): Payer: Medicare HMO | Admitting: Pharmacist Clinician (PhC)/ Clinical Pharmacy Specialist

## 2018-03-26 DIAGNOSIS — I482 Chronic atrial fibrillation: Secondary | ICD-10-CM | POA: Diagnosis not present

## 2018-03-26 DIAGNOSIS — Z7901 Long term (current) use of anticoagulants: Secondary | ICD-10-CM | POA: Diagnosis not present

## 2018-03-26 DIAGNOSIS — I4821 Permanent atrial fibrillation: Secondary | ICD-10-CM

## 2018-04-03 ENCOUNTER — Telehealth: Payer: Self-pay | Admitting: Cardiovascular Disease

## 2018-04-03 ENCOUNTER — Telehealth: Payer: Self-pay | Admitting: Cardiology

## 2018-04-03 ENCOUNTER — Ambulatory Visit (INDEPENDENT_AMBULATORY_CARE_PROVIDER_SITE_OTHER): Payer: Medicare HMO | Admitting: *Deleted

## 2018-04-03 DIAGNOSIS — I482 Chronic atrial fibrillation: Secondary | ICD-10-CM

## 2018-04-03 DIAGNOSIS — I4821 Permanent atrial fibrillation: Secondary | ICD-10-CM

## 2018-04-03 NOTE — Telephone Encounter (Signed)
New Message ° ° ° °1. Has your device fired? no ° °2. Is you device beeping? no ° °3. Are you experiencing draining or swelling at device site? no ° °4. Are you calling to see if we received your device transmission? yes ° °5. Have you passed out? no ° ° ° °Please route to Device Clinic Pool ° °

## 2018-04-03 NOTE — Telephone Encounter (Signed)
Spoke w/ pt daughter and informed her that the remote transmission was received.

## 2018-04-03 NOTE — Telephone Encounter (Signed)
Confirmed remote transmission w/ pt daughter.   

## 2018-04-04 NOTE — Progress Notes (Signed)
Remote pacemaker transmission.   

## 2018-04-05 ENCOUNTER — Encounter: Payer: Self-pay | Admitting: Cardiology

## 2018-04-11 DIAGNOSIS — I480 Paroxysmal atrial fibrillation: Secondary | ICD-10-CM | POA: Diagnosis not present

## 2018-04-11 DIAGNOSIS — M810 Age-related osteoporosis without current pathological fracture: Secondary | ICD-10-CM | POA: Diagnosis not present

## 2018-04-11 DIAGNOSIS — I5022 Chronic systolic (congestive) heart failure: Secondary | ICD-10-CM | POA: Diagnosis not present

## 2018-04-11 DIAGNOSIS — I10 Essential (primary) hypertension: Secondary | ICD-10-CM | POA: Diagnosis not present

## 2018-04-19 DIAGNOSIS — H52203 Unspecified astigmatism, bilateral: Secondary | ICD-10-CM | POA: Diagnosis not present

## 2018-04-19 DIAGNOSIS — Z961 Presence of intraocular lens: Secondary | ICD-10-CM | POA: Diagnosis not present

## 2018-04-19 DIAGNOSIS — H401131 Primary open-angle glaucoma, bilateral, mild stage: Secondary | ICD-10-CM | POA: Diagnosis not present

## 2018-04-19 DIAGNOSIS — H353131 Nonexudative age-related macular degeneration, bilateral, early dry stage: Secondary | ICD-10-CM | POA: Diagnosis not present

## 2018-04-19 DIAGNOSIS — H524 Presbyopia: Secondary | ICD-10-CM | POA: Diagnosis not present

## 2018-04-19 DIAGNOSIS — H5213 Myopia, bilateral: Secondary | ICD-10-CM | POA: Diagnosis not present

## 2018-04-20 ENCOUNTER — Other Ambulatory Visit: Payer: Self-pay | Admitting: Cardiovascular Disease

## 2018-04-20 NOTE — Telephone Encounter (Signed)
Rx request sent to pharmacy.  

## 2018-04-24 LAB — CUP PACEART REMOTE DEVICE CHECK
Battery Impedance: 346 Ohm
Implantable Lead Implant Date: 20071023
Implantable Lead Location: 753859
Implantable Pulse Generator Implant Date: 20140808
Lead Channel Impedance Value: 67 Ohm
Lead Channel Impedance Value: 824 Ohm
Lead Channel Pacing Threshold Pulse Width: 0.4 ms
Lead Channel Setting Pacing Amplitude: 2 V
MDC IDC LEAD IMPLANT DT: 20071023
MDC IDC LEAD LOCATION: 753860
MDC IDC MSMT BATTERY REMAINING LONGEVITY: 114 mo
MDC IDC MSMT BATTERY VOLTAGE: 2.79 V
MDC IDC MSMT LEADCHNL RV PACING THRESHOLD AMPLITUDE: 0.625 V
MDC IDC SESS DTM: 20190507194952
MDC IDC SET LEADCHNL RV PACING PULSEWIDTH: 0.4 ms
MDC IDC SET LEADCHNL RV SENSING SENSITIVITY: 5.6 mV
MDC IDC STAT BRADY RV PERCENT PACED: 36 %

## 2018-04-25 ENCOUNTER — Other Ambulatory Visit: Payer: Self-pay | Admitting: Cardiovascular Disease

## 2018-04-25 DIAGNOSIS — Z7901 Long term (current) use of anticoagulants: Secondary | ICD-10-CM | POA: Diagnosis not present

## 2018-04-25 DIAGNOSIS — I4891 Unspecified atrial fibrillation: Secondary | ICD-10-CM | POA: Diagnosis not present

## 2018-04-26 LAB — PROTIME-INR
INR: 2.1 — AB
PROTHROMBIN TIME: 22.1 s — AB (ref 9.0–11.5)

## 2018-05-01 ENCOUNTER — Ambulatory Visit (INDEPENDENT_AMBULATORY_CARE_PROVIDER_SITE_OTHER): Payer: Medicare HMO | Admitting: Pharmacist

## 2018-05-01 DIAGNOSIS — Z7901 Long term (current) use of anticoagulants: Secondary | ICD-10-CM

## 2018-05-01 DIAGNOSIS — I482 Chronic atrial fibrillation: Secondary | ICD-10-CM

## 2018-05-01 DIAGNOSIS — I4821 Permanent atrial fibrillation: Secondary | ICD-10-CM

## 2018-05-11 ENCOUNTER — Other Ambulatory Visit (HOSPITAL_COMMUNITY)
Admission: RE | Admit: 2018-05-11 | Discharge: 2018-05-11 | Disposition: A | Discharge status: Home / Self Care | Payer: Medicare HMO | Source: Ambulatory Visit | Attending: Urology | Admitting: Urology

## 2018-05-11 ENCOUNTER — Ambulatory Visit (INDEPENDENT_AMBULATORY_CARE_PROVIDER_SITE_OTHER): Payer: Medicare HMO | Admitting: Urology

## 2018-05-11 DIAGNOSIS — N3020 Other chronic cystitis without hematuria: Secondary | ICD-10-CM | POA: Insufficient documentation

## 2018-05-11 DIAGNOSIS — R4182 Altered mental status, unspecified: Secondary | ICD-10-CM | POA: Diagnosis not present

## 2018-05-11 LAB — URINALYSIS, COMPLETE (UACMP) WITH MICROSCOPIC
BILIRUBIN URINE: NEGATIVE
Glucose, UA: NEGATIVE mg/dL
KETONES UR: NEGATIVE mg/dL
Nitrite: NEGATIVE
PROTEIN: 30 mg/dL — AB
Specific Gravity, Urine: 1.011 (ref 1.005–1.030)
pH: 7 (ref 5.0–8.0)

## 2018-05-12 LAB — URINE CULTURE: Culture: NO GROWTH

## 2018-05-25 ENCOUNTER — Other Ambulatory Visit: Payer: Self-pay | Admitting: Cardiovascular Disease

## 2018-05-25 DIAGNOSIS — Z7901 Long term (current) use of anticoagulants: Secondary | ICD-10-CM | POA: Diagnosis not present

## 2018-05-25 DIAGNOSIS — I4891 Unspecified atrial fibrillation: Secondary | ICD-10-CM | POA: Diagnosis not present

## 2018-05-26 LAB — PROTIME-INR
INR: 1.8 — ABNORMAL HIGH
Prothrombin Time: 18.9 s — ABNORMAL HIGH (ref 9.0–11.5)

## 2018-05-28 ENCOUNTER — Ambulatory Visit (INDEPENDENT_AMBULATORY_CARE_PROVIDER_SITE_OTHER): Payer: Medicare HMO | Admitting: Pharmacist Clinician (PhC)/ Clinical Pharmacy Specialist

## 2018-05-28 DIAGNOSIS — Z7901 Long term (current) use of anticoagulants: Secondary | ICD-10-CM

## 2018-05-28 DIAGNOSIS — I4821 Permanent atrial fibrillation: Secondary | ICD-10-CM

## 2018-05-28 DIAGNOSIS — I482 Chronic atrial fibrillation: Secondary | ICD-10-CM | POA: Diagnosis not present

## 2018-06-06 ENCOUNTER — Emergency Department (HOSPITAL_COMMUNITY): Payer: Medicare HMO

## 2018-06-06 ENCOUNTER — Other Ambulatory Visit: Payer: Self-pay

## 2018-06-06 ENCOUNTER — Emergency Department (HOSPITAL_COMMUNITY)
Admission: EM | Admit: 2018-06-06 | Discharge: 2018-06-06 | Disposition: A | Discharge status: Home / Self Care | Payer: Medicare HMO | Attending: Emergency Medicine | Admitting: Emergency Medicine

## 2018-06-06 ENCOUNTER — Encounter (HOSPITAL_COMMUNITY): Payer: Self-pay | Admitting: Emergency Medicine

## 2018-06-06 DIAGNOSIS — R112 Nausea with vomiting, unspecified: Secondary | ICD-10-CM | POA: Diagnosis not present

## 2018-06-06 DIAGNOSIS — I10 Essential (primary) hypertension: Secondary | ICD-10-CM | POA: Diagnosis not present

## 2018-06-06 DIAGNOSIS — F0390 Unspecified dementia without behavioral disturbance: Secondary | ICD-10-CM | POA: Insufficient documentation

## 2018-06-06 DIAGNOSIS — Z950 Presence of cardiac pacemaker: Secondary | ICD-10-CM | POA: Diagnosis not present

## 2018-06-06 DIAGNOSIS — R410 Disorientation, unspecified: Secondary | ICD-10-CM | POA: Diagnosis not present

## 2018-06-06 DIAGNOSIS — Z79899 Other long term (current) drug therapy: Secondary | ICD-10-CM | POA: Diagnosis not present

## 2018-06-06 DIAGNOSIS — B372 Candidiasis of skin and nail: Secondary | ICD-10-CM | POA: Insufficient documentation

## 2018-06-06 DIAGNOSIS — R0989 Other specified symptoms and signs involving the circulatory and respiratory systems: Secondary | ICD-10-CM | POA: Diagnosis not present

## 2018-06-06 DIAGNOSIS — R1110 Vomiting, unspecified: Secondary | ICD-10-CM | POA: Diagnosis present

## 2018-06-06 DIAGNOSIS — E860 Dehydration: Secondary | ICD-10-CM | POA: Diagnosis not present

## 2018-06-06 DIAGNOSIS — R0902 Hypoxemia: Secondary | ICD-10-CM | POA: Diagnosis not present

## 2018-06-06 DIAGNOSIS — R531 Weakness: Secondary | ICD-10-CM | POA: Diagnosis not present

## 2018-06-06 LAB — COMPREHENSIVE METABOLIC PANEL
ALT: 12 U/L (ref 0–44)
ANION GAP: 5 (ref 5–15)
AST: 19 U/L (ref 15–41)
Albumin: 3.2 g/dL — ABNORMAL LOW (ref 3.5–5.0)
Alkaline Phosphatase: 56 U/L (ref 38–126)
BILIRUBIN TOTAL: 0.6 mg/dL (ref 0.3–1.2)
BUN: 26 mg/dL — ABNORMAL HIGH (ref 8–23)
CHLORIDE: 108 mmol/L (ref 98–111)
CO2: 24 mmol/L (ref 22–32)
Calcium: 8.4 mg/dL — ABNORMAL LOW (ref 8.9–10.3)
Creatinine, Ser: 0.77 mg/dL (ref 0.44–1.00)
Glucose, Bld: 103 mg/dL — ABNORMAL HIGH (ref 70–99)
POTASSIUM: 4.2 mmol/L (ref 3.5–5.1)
Sodium: 137 mmol/L (ref 135–145)
TOTAL PROTEIN: 6.6 g/dL (ref 6.5–8.1)

## 2018-06-06 LAB — CBC
HCT: 40.4 % (ref 36.0–46.0)
HEMOGLOBIN: 12.6 g/dL (ref 12.0–15.0)
MCH: 30.1 pg (ref 26.0–34.0)
MCHC: 31.2 g/dL (ref 30.0–36.0)
MCV: 96.7 fL (ref 78.0–100.0)
Platelets: 218 10*3/uL (ref 150–400)
RBC: 4.18 MIL/uL (ref 3.87–5.11)
RDW: 15.3 % (ref 11.5–15.5)
WBC: 7.3 10*3/uL (ref 4.0–10.5)

## 2018-06-06 LAB — URINALYSIS, ROUTINE W REFLEX MICROSCOPIC
BILIRUBIN URINE: NEGATIVE
Glucose, UA: NEGATIVE mg/dL
Hgb urine dipstick: NEGATIVE
Ketones, ur: NEGATIVE mg/dL
LEUKOCYTES UA: NEGATIVE
NITRITE: NEGATIVE
Protein, ur: NEGATIVE mg/dL
SPECIFIC GRAVITY, URINE: 1.015 (ref 1.005–1.030)
pH: 7 (ref 5.0–8.0)

## 2018-06-06 LAB — CBG MONITORING, ED: Glucose-Capillary: 102 mg/dL — ABNORMAL HIGH (ref 70–99)

## 2018-06-06 LAB — TROPONIN I

## 2018-06-06 LAB — LIPASE, BLOOD: LIPASE: 50 U/L (ref 11–51)

## 2018-06-06 MED ORDER — NYSTATIN 100000 UNIT/GM EX POWD: Freq: Four times a day (QID) | CUTANEOUS | 0 refills | Status: DC

## 2018-06-06 MED ORDER — ONDANSETRON 4 MG PO TBDP: 4.0000 mg | ORAL_TABLET | Freq: Three times a day (TID) | ORAL | 0 refills | Status: DC | PRN

## 2018-06-06 MED ORDER — SODIUM CHLORIDE 0.9 % IV SOLN
INTRAVENOUS | Status: DC
  Administered 2018-06-06: 20:00:00 via INTRAVENOUS

## 2018-06-06 MED ORDER — ONDANSETRON HCL 4 MG/2ML IJ SOLN
4.0000 mg | Freq: Once | INTRAMUSCULAR | Status: AC
Start: 2018-06-06 — End: 2018-06-06
  Administered 2018-06-06: 4 mg via INTRAVENOUS
  Filled 2018-06-06: qty 2

## 2018-06-06 NOTE — ED Notes (Signed)
Per daughter pt has been having emesis with nausea today.

## 2018-06-06 NOTE — Discharge Instructions (Signed)
Please take your medications exactly as prescribed, follow-up with your doctor within 48 hours if still feeling nauseated.  Your testing this evening was normal without any signs of infection or significant abnormalities of your kidneys, there was no signs of urinary infection or pneumonia.  Zofran 4 mg as needed for nausea every 6 hours.  Emergency department for severe or worsening symptoms

## 2018-06-06 NOTE — ED Triage Notes (Signed)
Patient brought in by EMS for Altered Mental status. Patient incontinent of urine. Has not been changed since this am per EMS. Pt has had some episodes of emesis, unsure of last time.

## 2018-06-06 NOTE — ED Provider Notes (Signed)
[facility] EMERGENCY DEPARTMENT Provider Note   CSN: [id number] Arrival date & time: 06/06/18  1843     History   Chief Complaint Chief Complaint  Patient presents with  . Altered Mental Status    HPI [name] is a 84 y.o. female.  HPI  The patient is an 84 year old female, she has a known history of atrial fibrillation, she has a pacemaker, she has had urinary infections high blood pressure hyperlipidemia and dementia, she is unable to give any history because of the advanced dementia.  Her daughter presents with her and is the primary historian.  She reports that she saw her this morning at the house, she was sitting at the kitchen table at that time, she had been able to ambulate back and forth to the bathroom with assistance which is her norm.  When the family member went back to the house the patient was sitting on the couch and had vomited, she reports that she has vomited multiple times since that time, there is no other complaints including chest pain fevers coughing shortness of breath swelling or diarrhea.  She does have a rash on her bilateral anterior abdomen which is something that comes and goes and seems to be red and irritated.  Level 5 caveat applies secondary to altered mental status.  Past Medical History:  Diagnosis Date  . Acid reflux   . Atrial fibrillation (HCC)   . Atrophic vaginitis   . Chronic cystitis   . Decubitus ulcer of buttock   . Dementia   . Hyperlipidemia   . Hypertension   . Incomplete bladder emptying   . Microscopic hematuria   . Pacemaker   . Straining on urination   . Symptomatic bradycardia   . Urge incontinence of urine   . UTI (lower urinary tract infection)     Patient Active Problem List   Diagnosis Date Noted  . Pressure injury of skin 12/05/2017  . Metabolic encephalopathy 12/04/2017  . Hyperlipidemia 11/14/2013  . Mitral insufficiency 11/14/2013  . Symptomatic bradycardia 07/05/2013  . Pacemaker - Medtronic 2007,  new generator 2014 07/05/2013  . Atrial fibrillation (HCC) 03/15/2013  . Long term current use of anticoagulant therapy 03/15/2013    Past Surgical History:  Procedure Laterality Date  . CARDIAC CATHETERIZATION  04/2004  . PACEMAKER GENERATOR CHANGE N/A 07/05/2013   Procedure: PACEMAKER GENERATOR CHANGE;  Surgeon: Thurmon Fair, MD;  Location: [facility] CATH [facility];  Service: Cardiovascular;  Laterality: N/A;  . PACEMAKER INSERTION       OB History   None      Home Medications    Prior to Admission medications   Medication Sig Start Date End Date Taking? Authorizing Provider  cephALEXin (KEFLEX) 250 MG capsule Take 250 mg by mouth at bedtime.   Yes [provider]  furosemide (LASIX) 40 MG tablet Take 20 mg by mouth daily. Prescribed 40mg  twice daily* 09/25/14  Yes [provider]  loratadine (CLARITIN) 10 MG tablet Take 10 mg by mouth daily as needed for allergies.   Yes [provider]  metoprolol succinate (TOPROL-XL) 25 MG 24 hr tablet TAKE 1 AND 1/2 TABLETS BY MOUTH ONCE DAILY. 04/20/18  Yes Croitoru, Mihai, MD  Multiple Vitamins-Minerals (CENTRUM SILVER PO) Take 1 tablet by mouth daily.     Yes [provider]  omeprazole (PRILOSEC) 20 MG capsule Take 20 mg by mouth daily.     Yes [provider]  potassium chloride SA (K-DUR,KLOR-CON) 20 MEQ tablet Take  1 tablet by mouth daily. 09/24/14  Yes [provider]  simvastatin (ZOCOR) 10 MG tablet Take 10 mg by mouth daily.   Yes [provider]  Travoprost, BAK Free, (TRAVATAN Z) 0.004 % SOLN ophthalmic solution Place 1 drop into both eyes at bedtime. 06/13/17  Yes [provider]  warfarin (COUMADIN) 3 MG tablet Take 1/2 to 1 tablet by mouth daily as directed Patient taking differently: Take 3 mg by mouth every evening.  04/20/18  Yes Croitoru, Mihai, MD  nystatin (MYCOSTATIN/NYSTOP) powder Apply topically 4 (four) times daily. 06/06/18   [name], MD  ondansetron  (ZOFRAN ODT) 4 MG disintegrating tablet Take 1 tablet (4 mg total) by mouth every 8 (eight) hours as needed for nausea. 06/06/18   [name], MD    Family History Family History  Problem Relation Age of Onset  . Cancer Other     Social History Social History   Tobacco Use  . Smoking status: Never Smoker  . Smokeless tobacco: Never Used  Substance Use Topics  . Alcohol use: No  . Drug use: No     Allergies   Contrast media [iodinated diagnostic agents]; Penicillins; and Sulfa antibiotics   Review of Systems Review of Systems  Unable to perform ROS: Dementia     Physical Exam Updated Vital Signs BP 139/66   Pulse 62   Temp 98.6 F (37 C) (Rectal)   Resp (!) 22   Wt 70.8 kg (156 lb)   SpO2 95%   BMI 25.18 kg/m   Physical Exam  Constitutional: She appears well-developed and well-nourished. No distress.  HENT:  Head: Normocephalic and atraumatic.  Mouth/Throat: No oropharyngeal exudate.  MM dry  Eyes: Conjunctivae and EOM are normal. Right eye exhibits no discharge. Left eye exhibits no discharge. No scleral icterus.  Abnormal shaped pupils bilaterally (hx of surgery per daughter)  Neck: Normal range of motion. Neck supple. No JVD present. No thyromegaly present.  Cardiovascular: Normal rate, regular rhythm and intact distal pulses. Exam reveals no gallop and no friction rub.  Murmur ( soft systolic) heard. Pulmonary/Chest: Effort normal and breath sounds normal. No respiratory distress. She has no wheezes. She has no rales.  Abdominal: Soft. Bowel sounds are normal. She exhibits distension. She exhibits no mass. There is tenderness ( mild mid abd ttp and distention).  Musculoskeletal: Normal range of motion. She exhibits no edema or tenderness.  Lymphadenopathy:    She has no cervical adenopathy.  Neurological: She is alert. Coordination normal.  Able to move all 4 extremities, she is able to lift both arms and lift both legs, she is able to help sit up in  the bed, though she is generally weak she is able to perform this without any lateralizing weakness.  There is no obvious facial droop.  She is able to speak, she speaks very little but what she does say is clear  Skin: Skin is warm and dry. Rash ( Erythematous rash over the anterior abdominal wall, slightly moist, some satellite lesions) noted. No erythema.  Psychiatric: She has a normal mood and affect. Her behavior is normal.  Nursing note and vitals reviewed.    ED Treatments / Results  Labs (all labs ordered are listed, but only abnormal results are displayed) Labs Reviewed  COMPREHENSIVE METABOLIC PANEL - Abnormal; Notable for the following components:      Result Value   Glucose, Bld 103 (*)    BUN 26 (*)    Calcium 8.4 (*)  Albumin 3.2 (*)    All other components within normal limits  CBG MONITORING, ED - Abnormal; Notable for the following components:   Glucose-Capillary 102 (*)    All other components within normal limits  CBC  URINALYSIS, ROUTINE W REFLEX MICROSCOPIC  LIPASE, BLOOD  TROPONIN I    EKG EKG Interpretation  Date/Time:  Wednesday June 06 2018 18:56:56 EDT Ventricular Rate:  96 PR Interval:    QRS Duration: 121 QT Interval:  436 QTC Calculation: 519 R Axis:     Text Interpretation:  Afib/flut and V-paced complexes No further rhythm analysis attempted due to paced rhythm IVCD, consider atypical RBBB Nonspecific repol abnormality, lateral leads Since last tracing rate slower Confirmed by Eber HongMiller, Elishah Ashmore ([id number]) on 06/06/2018 7:19:26 PM   Radiology Dg Chest Port 1 View  Result Date: 06/06/2018 CLINICAL DATA:  Altered mental status EXAM: PORTABLE CHEST 1 VIEW COMPARISON:  12/04/2017 FINDINGS: Low volume chest with interstitial crowding and vascular congestion. Chronic cardiomegaly with dual-chamber pacer leads from the left. No Kerley lines, effusion, or air bronchograms. IMPRESSION: 1. Limited low volume chest. 2. Cardiomegaly and vascular congestion.  Electronically Signed   By: Marnee SpringJonathon  Watts M.D.   On: 06/06/2018 20:23    Procedures Procedures (including critical care time)  Medications Ordered in ED Medications  0.9 %  sodium chloride infusion ( Intravenous New Bag/Given 06/06/18 1940)  ondansetron (ZOFRAN) injection 4 mg (4 mg Intravenous Given 06/06/18 1940)     Initial Impression / Assessment and Plan / ED Course  I have reviewed the triage vital signs and the nursing notes.  Pertinent labs & imaging results that were available during my care of the patient were reviewed by me and considered in my medical decision making (see chart for details).  Clinical Course as of Jun 06 2136  Wed Jun 06, 2018  2128 Lab work-up is unremarkable with normal troponin, clean urinalysis without dehydration, normal lipase, normal glucose, preserved renal function as well as liver function and normal blood count.  Last blood pressure check was 139/66 with a pulse of 62.  Her oxygen is normal, her chest x-ray shows no signs of infiltrates, overall the patient appears well.  She has had no further vomiting.  She will take her evening medications, p.o. trial, daughter is happy to take her home and return as needed.   [BM]    Clinical Course User Index [BM] Eber HongMiller, Jean Skow, MD   EKG shows paced rhythm, urinalysis is totally normal, vital signs reviewed and are overall unremarkable.  The patient does look a little dehydrated, the cause of her vomiting is unclear but will start with some lab work, a chest x-ray, she does have a rash on her abdominal wall that is consistent with a yeast type rash.  She will need a topical nystatin.  Final Clinical Impressions(s) / ED Diagnoses   Final diagnoses:  Candidal skin infection  Non-intractable vomiting with nausea, unspecified vomiting type    ED Discharge Orders        Ordered    nystatin (MYCOSTATIN/NYSTOP) powder  4 times daily     06/06/18 2003    ondansetron (ZOFRAN ODT) 4 MG disintegrating tablet   Every 8 hours PRN     06/06/18 2133       Eber HongMiller, Brettany Sydney, MD 06/06/18 2137

## 2018-06-26 ENCOUNTER — Other Ambulatory Visit: Payer: Self-pay | Admitting: Cardiovascular Disease

## 2018-06-26 DIAGNOSIS — I4891 Unspecified atrial fibrillation: Secondary | ICD-10-CM | POA: Diagnosis not present

## 2018-06-26 DIAGNOSIS — Z7901 Long term (current) use of anticoagulants: Secondary | ICD-10-CM | POA: Diagnosis not present

## 2018-06-26 LAB — PROTIME-INR
INR: 2.2 — AB
PROTHROMBIN TIME: 23.3 s — AB (ref 9.0–11.5)

## 2018-07-03 ENCOUNTER — Ambulatory Visit (INDEPENDENT_AMBULATORY_CARE_PROVIDER_SITE_OTHER): Payer: Medicare HMO | Admitting: *Deleted

## 2018-07-03 DIAGNOSIS — I4891 Unspecified atrial fibrillation: Secondary | ICD-10-CM | POA: Diagnosis not present

## 2018-07-04 NOTE — Progress Notes (Signed)
Remote pacemaker transmission.   

## 2018-07-11 DIAGNOSIS — I5022 Chronic systolic (congestive) heart failure: Secondary | ICD-10-CM | POA: Diagnosis not present

## 2018-07-11 DIAGNOSIS — I10 Essential (primary) hypertension: Secondary | ICD-10-CM | POA: Diagnosis not present

## 2018-07-11 DIAGNOSIS — I480 Paroxysmal atrial fibrillation: Secondary | ICD-10-CM | POA: Diagnosis not present

## 2018-07-11 DIAGNOSIS — G301 Alzheimer's disease with late onset: Secondary | ICD-10-CM | POA: Diagnosis not present

## 2018-07-19 ENCOUNTER — Other Ambulatory Visit: Payer: Self-pay | Admitting: Cardiovascular Disease

## 2018-07-24 LAB — CUP PACEART REMOTE DEVICE CHECK
Battery Remaining Longevity: 109 mo
Battery Voltage: 2.79 V
Brady Statistic RV Percent Paced: 39 %
Date Time Interrogation Session: 20190806173504
Implantable Lead Implant Date: 20071023
Implantable Lead Implant Date: 20071023
Implantable Lead Location: 753859
Implantable Lead Location: 753860
Implantable Lead Model: 5594
Lead Channel Impedance Value: 67 Ohm
Lead Channel Pacing Threshold Pulse Width: 0.4 ms
Lead Channel Setting Pacing Amplitude: 2 V
Lead Channel Setting Pacing Pulse Width: 0.4 ms
MDC IDC MSMT BATTERY IMPEDANCE: 396 Ohm
MDC IDC MSMT LEADCHNL RV IMPEDANCE VALUE: 759 Ohm
MDC IDC MSMT LEADCHNL RV PACING THRESHOLD AMPLITUDE: 0.625 V
MDC IDC PG IMPLANT DT: 20140808
MDC IDC SET LEADCHNL RV SENSING SENSITIVITY: 5.6 mV

## 2018-07-26 ENCOUNTER — Other Ambulatory Visit: Payer: Self-pay | Admitting: Cardiovascular Disease

## 2018-07-26 DIAGNOSIS — Z7901 Long term (current) use of anticoagulants: Secondary | ICD-10-CM | POA: Diagnosis not present

## 2018-07-26 DIAGNOSIS — I10 Essential (primary) hypertension: Secondary | ICD-10-CM | POA: Diagnosis not present

## 2018-07-26 DIAGNOSIS — I480 Paroxysmal atrial fibrillation: Secondary | ICD-10-CM | POA: Diagnosis not present

## 2018-07-26 DIAGNOSIS — I4891 Unspecified atrial fibrillation: Secondary | ICD-10-CM | POA: Diagnosis not present

## 2018-07-26 DIAGNOSIS — M810 Age-related osteoporosis without current pathological fracture: Secondary | ICD-10-CM | POA: Diagnosis not present

## 2018-07-26 DIAGNOSIS — I5022 Chronic systolic (congestive) heart failure: Secondary | ICD-10-CM | POA: Diagnosis not present

## 2018-07-26 LAB — PROTIME-INR
INR: 2.3 — ABNORMAL HIGH
PROTHROMBIN TIME: 24.4 s — AB (ref 9.0–11.5)

## 2018-07-27 ENCOUNTER — Ambulatory Visit (INDEPENDENT_AMBULATORY_CARE_PROVIDER_SITE_OTHER): Payer: Medicare HMO | Admitting: Pharmacist

## 2018-07-27 DIAGNOSIS — I482 Chronic atrial fibrillation: Secondary | ICD-10-CM

## 2018-07-27 DIAGNOSIS — Z7901 Long term (current) use of anticoagulants: Secondary | ICD-10-CM | POA: Diagnosis not present

## 2018-07-27 DIAGNOSIS — I4821 Permanent atrial fibrillation: Secondary | ICD-10-CM

## 2018-08-24 ENCOUNTER — Other Ambulatory Visit: Payer: Self-pay | Admitting: Cardiovascular Disease

## 2018-08-24 DIAGNOSIS — I4891 Unspecified atrial fibrillation: Secondary | ICD-10-CM | POA: Diagnosis not present

## 2018-08-24 DIAGNOSIS — Z7901 Long term (current) use of anticoagulants: Secondary | ICD-10-CM | POA: Diagnosis not present

## 2018-08-25 LAB — PROTIME-INR
INR: 2.5 — AB
Prothrombin Time: 25.9 s — ABNORMAL HIGH (ref 9.0–11.5)

## 2018-08-27 ENCOUNTER — Ambulatory Visit (INDEPENDENT_AMBULATORY_CARE_PROVIDER_SITE_OTHER): Payer: Medicare HMO | Admitting: Pharmacist Clinician (PhC)/ Clinical Pharmacy Specialist

## 2018-08-27 DIAGNOSIS — Z7901 Long term (current) use of anticoagulants: Secondary | ICD-10-CM

## 2018-08-27 DIAGNOSIS — I482 Chronic atrial fibrillation: Secondary | ICD-10-CM | POA: Diagnosis not present

## 2018-08-27 DIAGNOSIS — I4821 Permanent atrial fibrillation: Secondary | ICD-10-CM

## 2018-09-25 ENCOUNTER — Other Ambulatory Visit: Payer: Self-pay | Admitting: Cardiovascular Disease

## 2018-09-25 DIAGNOSIS — I4891 Unspecified atrial fibrillation: Secondary | ICD-10-CM | POA: Diagnosis not present

## 2018-09-25 DIAGNOSIS — Z7901 Long term (current) use of anticoagulants: Secondary | ICD-10-CM | POA: Diagnosis not present

## 2018-09-25 LAB — PROTIME-INR
INR: 1.9 — ABNORMAL HIGH
PROTHROMBIN TIME: 19.2 s — AB (ref 9.0–11.5)

## 2018-10-02 ENCOUNTER — Ambulatory Visit (INDEPENDENT_AMBULATORY_CARE_PROVIDER_SITE_OTHER): Payer: Medicare HMO | Admitting: *Deleted

## 2018-10-02 DIAGNOSIS — R001 Bradycardia, unspecified: Secondary | ICD-10-CM

## 2018-10-03 NOTE — Progress Notes (Signed)
Remote pacemaker transmission.   

## 2018-10-07 ENCOUNTER — Encounter: Payer: Self-pay | Admitting: Cardiology

## 2018-10-08 DIAGNOSIS — Z0001 Encounter for general adult medical examination with abnormal findings: Secondary | ICD-10-CM | POA: Diagnosis not present

## 2018-10-08 DIAGNOSIS — G301 Alzheimer's disease with late onset: Secondary | ICD-10-CM | POA: Diagnosis not present

## 2018-10-08 DIAGNOSIS — Z1331 Encounter for screening for depression: Secondary | ICD-10-CM | POA: Diagnosis not present

## 2018-10-08 DIAGNOSIS — I480 Paroxysmal atrial fibrillation: Secondary | ICD-10-CM | POA: Diagnosis not present

## 2018-10-08 DIAGNOSIS — I5022 Chronic systolic (congestive) heart failure: Secondary | ICD-10-CM | POA: Diagnosis not present

## 2018-10-08 DIAGNOSIS — Z1389 Encounter for screening for other disorder: Secondary | ICD-10-CM | POA: Diagnosis not present

## 2018-10-12 ENCOUNTER — Ambulatory Visit: Payer: Medicare HMO | Admitting: Urology

## 2018-10-12 DIAGNOSIS — N3941 Urge incontinence: Secondary | ICD-10-CM

## 2018-10-12 DIAGNOSIS — N3020 Other chronic cystitis without hematuria: Secondary | ICD-10-CM | POA: Diagnosis not present

## 2018-10-16 ENCOUNTER — Ambulatory Visit (INDEPENDENT_AMBULATORY_CARE_PROVIDER_SITE_OTHER): Payer: Medicare HMO | Admitting: Pharmacist Clinician (PhC)/ Clinical Pharmacy Specialist

## 2018-10-16 DIAGNOSIS — Z7901 Long term (current) use of anticoagulants: Secondary | ICD-10-CM | POA: Diagnosis not present

## 2018-10-16 DIAGNOSIS — I4821 Permanent atrial fibrillation: Secondary | ICD-10-CM

## 2018-10-22 ENCOUNTER — Other Ambulatory Visit: Payer: Self-pay | Admitting: Cardiovascular Disease

## 2018-10-22 DIAGNOSIS — H401131 Primary open-angle glaucoma, bilateral, mild stage: Secondary | ICD-10-CM | POA: Diagnosis not present

## 2018-10-22 DIAGNOSIS — Z961 Presence of intraocular lens: Secondary | ICD-10-CM | POA: Diagnosis not present

## 2018-10-22 DIAGNOSIS — I4891 Unspecified atrial fibrillation: Secondary | ICD-10-CM | POA: Diagnosis not present

## 2018-10-22 DIAGNOSIS — I10 Essential (primary) hypertension: Secondary | ICD-10-CM | POA: Diagnosis not present

## 2018-10-22 DIAGNOSIS — G301 Alzheimer's disease with late onset: Secondary | ICD-10-CM | POA: Diagnosis not present

## 2018-10-22 DIAGNOSIS — I5022 Chronic systolic (congestive) heart failure: Secondary | ICD-10-CM | POA: Diagnosis not present

## 2018-10-22 DIAGNOSIS — Z7901 Long term (current) use of anticoagulants: Secondary | ICD-10-CM | POA: Diagnosis not present

## 2018-10-23 ENCOUNTER — Other Ambulatory Visit: Payer: Self-pay | Admitting: Cardiovascular Disease

## 2018-10-23 LAB — PROTIME-INR
INR: 2.6 — ABNORMAL HIGH
Prothrombin Time: 25 s — ABNORMAL HIGH (ref 9.0–11.5)

## 2018-11-09 ENCOUNTER — Other Ambulatory Visit (HOSPITAL_COMMUNITY)
Admission: AD | Admit: 2018-11-09 | Discharge: 2018-11-09 | Disposition: A | Discharge status: Home / Self Care | Payer: Medicare HMO | Source: Other Acute Inpatient Hospital | Attending: Urology | Admitting: Urology

## 2018-11-09 DIAGNOSIS — N3020 Other chronic cystitis without hematuria: Secondary | ICD-10-CM | POA: Diagnosis not present

## 2018-11-13 LAB — CARBAPENEM RESISTANCE PANEL
CARBA RESISTANCE NDM GENE: NOT DETECTED
Carba Resistance IMP Gene: NOT DETECTED
Carba Resistance KPC Gene: NOT DETECTED
Carba Resistance OXA48 Gene: NOT DETECTED
Carba Resistance VIM Gene: NOT DETECTED

## 2018-11-13 LAB — URINE CULTURE: Culture: >=100000 — AB

## 2018-11-23 ENCOUNTER — Emergency Department (HOSPITAL_COMMUNITY)
Admission: EM | Admit: 2018-11-23 | Discharge: 2018-11-24 | Disposition: A | Discharge status: Home / Self Care | Payer: Medicare HMO | Attending: Emergency Medicine | Admitting: Emergency Medicine

## 2018-11-23 ENCOUNTER — Emergency Department (HOSPITAL_COMMUNITY): Payer: Medicare HMO

## 2018-11-23 ENCOUNTER — Encounter (HOSPITAL_COMMUNITY): Payer: Self-pay | Admitting: Emergency Medicine

## 2018-11-23 DIAGNOSIS — Z7901 Long term (current) use of anticoagulants: Secondary | ICD-10-CM | POA: Diagnosis not present

## 2018-11-23 DIAGNOSIS — R404 Transient alteration of awareness: Secondary | ICD-10-CM | POA: Diagnosis not present

## 2018-11-23 DIAGNOSIS — R55 Syncope and collapse: Secondary | ICD-10-CM | POA: Diagnosis not present

## 2018-11-23 DIAGNOSIS — J90 Pleural effusion, not elsewhere classified: Secondary | ICD-10-CM | POA: Diagnosis not present

## 2018-11-23 DIAGNOSIS — I10 Essential (primary) hypertension: Secondary | ICD-10-CM | POA: Insufficient documentation

## 2018-11-23 DIAGNOSIS — I4891 Unspecified atrial fibrillation: Secondary | ICD-10-CM | POA: Insufficient documentation

## 2018-11-23 DIAGNOSIS — Z79899 Other long term (current) drug therapy: Secondary | ICD-10-CM | POA: Insufficient documentation

## 2018-11-23 DIAGNOSIS — Z950 Presence of cardiac pacemaker: Secondary | ICD-10-CM | POA: Diagnosis not present

## 2018-11-23 DIAGNOSIS — R52 Pain, unspecified: Secondary | ICD-10-CM | POA: Diagnosis not present

## 2018-11-23 DIAGNOSIS — R531 Weakness: Secondary | ICD-10-CM

## 2018-11-23 DIAGNOSIS — R4182 Altered mental status, unspecified: Secondary | ICD-10-CM | POA: Diagnosis not present

## 2018-11-23 DIAGNOSIS — F0390 Unspecified dementia without behavioral disturbance: Secondary | ICD-10-CM | POA: Diagnosis not present

## 2018-11-23 DIAGNOSIS — I4510 Unspecified right bundle-branch block: Secondary | ICD-10-CM | POA: Diagnosis not present

## 2018-11-23 DIAGNOSIS — E860 Dehydration: Secondary | ICD-10-CM | POA: Diagnosis not present

## 2018-11-23 DIAGNOSIS — I499 Cardiac arrhythmia, unspecified: Secondary | ICD-10-CM | POA: Diagnosis not present

## 2018-11-23 MED ORDER — LORAZEPAM 2 MG/ML IJ SOLN
0.5000 mg | Freq: Once | INTRAMUSCULAR | Status: AC
  Administered 2018-11-24: 0.5 mg via INTRAVENOUS
  Filled 2018-11-23: qty 1

## 2018-11-23 MED ORDER — SODIUM CHLORIDE 0.9 % IV BOLUS
500.0000 mL | Freq: Once | INTRAVENOUS | Status: AC
  Administered 2018-11-24: 500 mL via INTRAVENOUS

## 2018-11-23 NOTE — ED Triage Notes (Signed)
Per [facility] called out for daughter stating she came home to find the pt "unconscious", [facility] reports pt was alert upon arrival, daughter reports pt is always confused but seems a little more confused than normal, per EMS staff pt has pacemaker but had an irregular rhythm

## 2018-11-23 NOTE — ED Triage Notes (Signed)
Pt recently completed abx for UTI

## 2018-11-23 NOTE — ED Provider Notes (Signed)
[facility] EMERGENCY DEPARTMENT Provider Note   CSN: [id number] Arrival date & time: 11/23/18  2303     History   Chief Complaint Chief Complaint  Patient presents with  . Altered Mental Status    HPI [name] is a 84 y.o. female.  Level 5 caveat for dementia and altered mental status.  Patient presents via EMS from home where she apparently lives alone.  EMS was called out for "unconscious person".  They found the patient to be awake and alert, breathing with a pulse.  She has dementia unable to give a history.  She yells when she is touched anywhere.  Family has arrived.  They state the screaming is normal.  Patient does live alone but has people checking her frequently.  She was seen normal by her family this morning.  The daughter attempted to wake the patient up to give her her nighttime medications and she "could not wake her up".  That is when EMS was called.  Daughter reports patient is more confused than normal.  She recently completed a course of antibiotics for UTI several days ago.  No documented fevers, chills, nausea, vomiting, falls or trauma.  The history is provided by the patient and a relative. The history is limited by the condition of the patient.  Altered Mental Status      Past Medical History:  Diagnosis Date  . Acid reflux   . Atrial fibrillation (HCC)   . Atrophic vaginitis   . Chronic cystitis   . Decubitus ulcer of buttock   . Dementia (HCC)   . Hyperlipidemia   . Hypertension   . Incomplete bladder emptying   . Microscopic hematuria   . Pacemaker   . Straining on urination   . Symptomatic bradycardia   . Urge incontinence of urine   . UTI (lower urinary tract infection)     Patient Active Problem List   Diagnosis Date Noted  . Pressure injury of skin 12/05/2017  . Metabolic encephalopathy 12/04/2017  . Hyperlipidemia 11/14/2013  . Mitral insufficiency 11/14/2013  . Symptomatic bradycardia 07/05/2013  . Pacemaker - [employer]  2007, new generator 2014 07/05/2013  . Atrial fibrillation (HCC) 03/15/2013  . Long term current use of anticoagulant therapy 03/15/2013    Past Surgical History:  Procedure Laterality Date  . CARDIAC CATHETERIZATION  04/2004  . PACEMAKER GENERATOR CHANGE N/A 07/05/2013   Procedure: PACEMAKER GENERATOR CHANGE;  Surgeon: Thurmon Fair, MD;  Location: [facility] CATH LAB;  Service: Cardiovascular;  Laterality: N/A;  . PACEMAKER INSERTION       OB History   No obstetric history on file.      Home Medications    Prior to Admission medications   Medication Sig Start Date End Date Taking? Authorizing Provider  cephALEXin (KEFLEX) 250 MG capsule Take 250 mg by mouth at bedtime.    [provider]  furosemide (LASIX) 40 MG tablet Take 20 mg by mouth daily. Prescribed 40mg  twice daily* 09/25/14   [provider]  loratadine (CLARITIN) 10 MG tablet Take 10 mg by mouth daily as needed for allergies.    [provider]  metoprolol succinate (TOPROL-XL) 25 MG 24 hr tablet TAKE 1 AND 1/2 TABLETS BY MOUTH ONCE DAILY. 10/23/18   Croitoru, Mihai, MD  Multiple Vitamins-Minerals (CENTRUM SILVER PO) Take 1 tablet by mouth daily.      [provider]  nystatin (MYCOSTATIN/NYSTOP) powder Apply topically 4 (four) times daily. 06/06/18   [name], MD  omeprazole (  PRILOSEC) 20 MG capsule Take 20 mg by mouth daily.      [provider]  ondansetron (ZOFRAN ODT) 4 MG disintegrating tablet Take 1 tablet (4 mg total) by mouth every 8 (eight) hours as needed for nausea. 06/06/18   Eber HongMiller, Brian, MD  potassium chloride SA (K-DUR,KLOR-CON) 20 MEQ tablet Take 1 tablet by mouth daily. 09/24/14   [provider]  simvastatin (ZOCOR) 10 MG tablet Take 10 mg by mouth daily.    [provider]  Travoprost, BAK Free, (TRAVATAN Z) 0.004 % SOLN ophthalmic solution Place 1 drop into both eyes at bedtime. 06/13/17   [provider]  warfarin (COUMADIN) 3 MG  tablet TAKE 1/2 TO 1 TABLET BY MOUTH DAILY AS DIRECTED. 10/24/18   Croitoru, Mihai, MD    Family History Family History  Problem Relation Age of Onset  . Cancer Other     Social History Social History   Tobacco Use  . Smoking status: Never Smoker  . Smokeless tobacco: Never Used  Substance Use Topics  . Alcohol use: No  . Drug use: No     Allergies   Contrast media [iodinated diagnostic agents]; Penicillins; and Sulfa antibiotics   Review of Systems Review of Systems  Unable to perform ROS: Dementia     Physical Exam Updated Vital Signs BP (!) 149/55 (BP Location: Left Arm)   Pulse 70   Temp 98 F (36.7 C) (Axillary)   Resp 14   Ht 5\' 5"  (1.651 m)   Wt 71.4 kg   SpO2 96%   BMI 26.19 kg/m   Physical Exam Vitals signs and nursing note reviewed.  Constitutional:      General: She is not in acute distress.    Appearance: She is well-developed.     Comments: Elderly, chronically ill-appearing  Yells when she is touched anywhere stating she "hurts all over" but cannot give any further details  HENT:     Head: Normocephalic and atraumatic.     Mouth/Throat:     Pharynx: No oropharyngeal exudate.  Eyes:     Conjunctiva/sclera: Conjunctivae normal.     Pupils: Pupils are equal, round, and reactive to light.     Comments: Dry mucous membranes, sunken orbits  Neck:     Musculoskeletal: Normal range of motion and neck supple.     Comments: No meningismus. Cardiovascular:     Rate and Rhythm: Normal rate. Rhythm irregular.     Heart sounds: Murmur present.  Pulmonary:     Effort: Pulmonary effort is normal. No respiratory distress.     Breath sounds: Normal breath sounds.  Abdominal:     Palpations: Abdomen is soft.     Tenderness: There is no abdominal tenderness. There is no guarding or rebound.  Musculoskeletal: Normal range of motion.        General: No tenderness.     Comments: No T or L-spine tenderness  Skin:    General: Skin is warm.    Neurological:     Mental Status: She is alert. She is disoriented.     Motor: No abnormal muscle tone.     Comments: Unable to answer any orientation questions, patient yells when she is touched anywhere but moves all extremities. Does not follow commands      ED Treatments / Results  Labs (all labs ordered are listed, but only abnormal results are displayed) Labs Reviewed  COMPREHENSIVE METABOLIC PANEL - Abnormal; Notable for the following components:  Result Value   Glucose, Bld 103 (*)    BUN 24 (*)    GFR calc non Af Amer 53 (*)    All other components within normal limits  URINALYSIS, ROUTINE W REFLEX MICROSCOPIC - Abnormal; Notable for the following components:   APPearance HAZY (*)    All other components within normal limits  PROTIME-INR - Abnormal; Notable for the following components:   Prothrombin Time 27.3 (*)    All other components within normal limits  URINE CULTURE  CBC WITH DIFFERENTIAL/PLATELET  I-STAT CG4 LACTIC ACID, ED    EKG EKG Interpretation  Date/Time:  Saturday November 24 2018 00:24:22 EST Ventricular Rate:  68 PR Interval:    QRS Duration: 128 QT Interval:  401 QTC Calculation: 433 R Axis:   15 Text Interpretation:  Afib/flut and V-paced complexes No further rhythm analysis attempted due to paced rhythm Right bundle branch block Nonspecific repol abnormality, lateral leads No significant change was found Confirmed by Glynn Octave [id number]) on 11/24/2018 12:28:36 AM   Radiology Dg Chest 2 View  Result Date: 11/24/2018 CLINICAL DATA:  Patient found unconscious. Acute onset of confusion. EXAM: CHEST - 2 VIEW COMPARISON:  Chest radiograph performed 06/06/2018 FINDINGS: A small left pleural effusion is noted. Left basilar airspace opacity may reflect atelectasis or pneumonia, depending on the patient's symptoms. This is somewhat more prominent than on prior studies. Peribronchial thickening is noted. The cardiomediastinal silhouette is  mildly enlarged. A pacemaker is noted at the left chest wall, with leads ending at the right atrium and right ventricle. No acute osseous abnormalities are identified. A chronic compression deformity is noted at the lower thoracic spine. IMPRESSION: 1. Small left pleural effusion noted. Left basilar airspace opacity may reflect atelectasis or pneumonia, depending on the patient's symptoms. This is somewhat more prominent than on prior studies. 2. Peribronchial thickening noted. 3. Mild cardiomegaly. Electronically Signed   By: Roanna Raider M.D.   On: 11/24/2018 01:06   Ct Head Wo Contrast  Result Date: 11/24/2018 CLINICAL DATA:  Acute onset of altered mental status. Patient found unconscious. Confusion. EXAM: CT HEAD WITHOUT CONTRAST TECHNIQUE: Contiguous axial images were obtained from the base of the skull through the vertex without intravenous contrast. COMPARISON:  CT of the head performed 12/04/2017 FINDINGS: Brain: No evidence of acute infarction, hemorrhage, hydrocephalus, extra-axial collection or mass lesion / mass effect. Evaluation is mildly suboptimal due to motion artifact. Prominence of the ventricles and sulci reflects moderate cortical volume loss. Mild cerebellar atrophy is noted. Diffuse periventricular subcortical white matter change likely reflects small vessel ischemic microangiopathy. Chronic ischemic change is noted at the right basal ganglia. The brainstem and fourth ventricle are within normal limits. The cerebral hemispheres demonstrate grossly normal gray-white differentiation. No mass effect or midline shift is seen. Vascular: No hyperdense vessel or unexpected calcification. Skull: There is no evidence of fracture; visualized osseous structures are unremarkable in appearance. Sinuses/Orbits: The orbits are within normal limits. There is mild partial opacification of the left maxillary sinus. The remaining paranasal sinuses and mastoid air cells are well-aerated. Other: No  significant soft tissue abnormalities are seen. IMPRESSION: 1. No acute intracranial pathology seen on CT. 2. Moderate cortical volume loss and diffuse small vessel ischemic microangiopathy. 3. Chronic ischemic change at the right basal ganglia. 4. Mild partial opacification of the left maxillary sinus. Electronically Signed   By: Roanna Raider M.D.   On: 11/24/2018 01:12    Procedures Procedures (including critical care time)  Medications Ordered in  ED Medications - No data to display   Initial Impression / Assessment and Plan / ED Course  I have reviewed the triage vital signs and the nursing notes.  Pertinent labs & imaging results that were available during my care of the patient were reviewed by me and considered in my medical decision making (see chart for details).    Dementia patient presenting with concern for worsening confusion and dehydration.  Vitals are stable.  Patient is afebrile.  She is unable to give any history. IVF given as she does appear mildly dry.  Family at bedside states she recently completed a course of antibiotics for UTI.  Her labs are reassuring.  Urinalysis today is negative.   pacemaker interrogation performed that shows normal functioning of pacemaker with underlying atrial fibrillation. No other arrhythmias. INR is therapeutic.  Patient being paced 41% of the time.  No ventricular high rate episodes.    Work-up is reassuring.  CT head is stable.  Chest x-ray findings discussed with family members.  Patient has not had no cough, fever or leukocytosis.\ Pneumonia seems less likely.  Patient back to baseline per family.  They are comfortable with her going home.  Patient is tolerating p.o. in the ED. Follow up with PCP. Return precautions discussed.    Final Cl.inical Impressions(s) / ED Diagnoses   Final diagnoses:  Weakness    ED Discharge Orders    None       Jossue Rubenstein, Jeannett SeniorStephen, MD 11/24/18 [phone]

## 2018-11-24 ENCOUNTER — Encounter (HOSPITAL_COMMUNITY): Payer: Self-pay

## 2018-11-24 DIAGNOSIS — R55 Syncope and collapse: Secondary | ICD-10-CM | POA: Diagnosis not present

## 2018-11-24 DIAGNOSIS — J90 Pleural effusion, not elsewhere classified: Secondary | ICD-10-CM | POA: Diagnosis not present

## 2018-11-24 DIAGNOSIS — R4182 Altered mental status, unspecified: Secondary | ICD-10-CM | POA: Diagnosis not present

## 2018-11-24 LAB — COMPREHENSIVE METABOLIC PANEL
ALT: 12 U/L (ref 0–44)
AST: 20 U/L (ref 15–41)
Albumin: 3.5 g/dL (ref 3.5–5.0)
Alkaline Phosphatase: 52 U/L (ref 38–126)
Anion gap: 6 (ref 5–15)
BUN: 24 mg/dL — ABNORMAL HIGH (ref 8–23)
CALCIUM: 9 mg/dL (ref 8.9–10.3)
CO2: 24 mmol/L (ref 22–32)
Chloride: 110 mmol/L (ref 98–111)
Creatinine, Ser: 0.95 mg/dL (ref 0.44–1.00)
GFR calc Af Amer: >60 mL/min (ref >60)
GFR calc non Af Amer: 53 mL/min — ABNORMAL LOW (ref >60)
Glucose, Bld: 103 mg/dL — ABNORMAL HIGH (ref 70–99)
Potassium: 4.6 mmol/L (ref 3.5–5.1)
Sodium: 140 mmol/L (ref 135–145)
Total Bilirubin: 0.6 mg/dL (ref 0.3–1.2)
Total Protein: 6.7 g/dL (ref 6.5–8.1)

## 2018-11-24 LAB — CBC WITH DIFFERENTIAL/PLATELET
Abs Immature Granulocytes: 0.01 10*3/uL (ref 0.00–0.07)
Basophils Absolute: 0.1 10*3/uL (ref 0.0–0.1)
Basophils Relative: 1 %
Eosinophils Absolute: 0.2 10*3/uL (ref 0.0–0.5)
Eosinophils Relative: 3 %
HCT: 43.1 % (ref 36.0–46.0)
Hemoglobin: 13.2 g/dL (ref 12.0–15.0)
Immature Granulocytes: 0 %
Lymphocytes Relative: 37 %
Lymphs Abs: 2.5 10*3/uL (ref 0.7–4.0)
MCH: 30.1 pg (ref 26.0–34.0)
MCHC: 30.6 g/dL (ref 30.0–36.0)
MCV: 98.2 fL (ref 80.0–100.0)
Monocytes Absolute: 0.7 10*3/uL (ref 0.1–1.0)
Monocytes Relative: 10 %
Neutro Abs: 3.3 10*3/uL (ref 1.7–7.7)
Neutrophils Relative %: 49 %
Platelets: 180 10*3/uL (ref 150–400)
RBC: 4.39 MIL/uL (ref 3.87–5.11)
RDW: 13.8 % (ref 11.5–15.5)
WBC: 6.7 10*3/uL (ref 4.0–10.5)
nRBC: 0 % (ref 0.0–0.2)

## 2018-11-24 LAB — I-STAT TROPONIN, ED: Troponin i, poc: 0.01 ng/mL (ref 0.00–0.08)

## 2018-11-24 LAB — URINALYSIS, ROUTINE W REFLEX MICROSCOPIC
BILIRUBIN URINE: NEGATIVE
Glucose, UA: NEGATIVE mg/dL
Hgb urine dipstick: NEGATIVE
KETONES UR: NEGATIVE mg/dL
Leukocytes, UA: NEGATIVE
Nitrite: NEGATIVE
Protein, ur: NEGATIVE mg/dL
Specific Gravity, Urine: 1.013 (ref 1.005–1.030)
pH: 6 (ref 5.0–8.0)

## 2018-11-24 LAB — PROTIME-INR
INR: 2.58
Prothrombin Time: 27.3 seconds — ABNORMAL HIGH (ref 11.4–15.2)

## 2018-11-24 LAB — I-STAT CG4 LACTIC ACID, ED: Lactic Acid, Venous: 1.61 mmol/L (ref 0.5–1.9)

## 2018-11-24 NOTE — ED Notes (Signed)
Pts Pacemaker Interrogated. Oren BinetHeather Cowart from [employer] called RN to report data results. Pts device is functioning appropriately but is suggesting Atrial Arrhythmia. Device last Interrogated on Feb. 27th, 2019. Pts Device is Ventricular Programmed. MD Rancour Notified.

## 2018-11-24 NOTE — ED Notes (Signed)
Patient transported to CT 

## 2018-11-24 NOTE — Discharge Instructions (Addendum)
Keep Norris hydrated. Blood work today is normal.  Urinalysis shows no infection.  CT head is stable.  Follow-up with your doctor for recheck this week.  Return to the ED with new or worsening symptoms.

## 2018-11-24 NOTE — ED Notes (Signed)
Pt able to swallow water and pudding with complication. Family assistance required to get Pt to cooperate.

## 2018-11-25 LAB — URINE CULTURE: Culture: NO GROWTH

## 2018-11-27 DIAGNOSIS — G301 Alzheimer's disease with late onset: Secondary | ICD-10-CM | POA: Diagnosis not present

## 2018-11-27 DIAGNOSIS — I480 Paroxysmal atrial fibrillation: Secondary | ICD-10-CM | POA: Diagnosis not present

## 2018-11-27 DIAGNOSIS — I5022 Chronic systolic (congestive) heart failure: Secondary | ICD-10-CM | POA: Diagnosis not present

## 2018-11-27 DIAGNOSIS — R531 Weakness: Secondary | ICD-10-CM | POA: Diagnosis not present

## 2018-12-02 LAB — CUP PACEART REMOTE DEVICE CHECK
Battery Impedance: 445 Ohm
Battery Remaining Longevity: 104 mo
Battery Voltage: 2.78 V
Brady Statistic RV Percent Paced: 41 %
Date Time Interrogation Session: 20191105173852
Implantable Lead Implant Date: 20071023
Implantable Lead Location: 753859
Implantable Lead Location: 753860
Implantable Lead Model: 4092
Implantable Lead Model: 5594
Implantable Pulse Generator Implant Date: 20140808
Lead Channel Impedance Value: 67 Ohm
Lead Channel Impedance Value: 843 Ohm
Lead Channel Pacing Threshold Pulse Width: 0.4 ms
Lead Channel Setting Pacing Amplitude: 2 V
Lead Channel Setting Sensing Sensitivity: 4 mV
MDC IDC LEAD IMPLANT DT: 20071023
MDC IDC MSMT LEADCHNL RV PACING THRESHOLD AMPLITUDE: 0.5 V
MDC IDC SET LEADCHNL RV PACING PULSEWIDTH: 0.4 ms

## 2018-12-11 ENCOUNTER — Other Ambulatory Visit (INDEPENDENT_AMBULATORY_CARE_PROVIDER_SITE_OTHER): Payer: Medicare HMO

## 2018-12-11 DIAGNOSIS — I4891 Unspecified atrial fibrillation: Secondary | ICD-10-CM

## 2018-12-29 ENCOUNTER — Other Ambulatory Visit: Payer: Self-pay

## 2018-12-29 ENCOUNTER — Emergency Department (HOSPITAL_COMMUNITY): Payer: Medicare HMO

## 2018-12-29 ENCOUNTER — Encounter (HOSPITAL_COMMUNITY): Payer: Self-pay | Admitting: Emergency Medicine

## 2018-12-29 ENCOUNTER — Emergency Department (HOSPITAL_COMMUNITY)
Admission: EM | Admit: 2018-12-29 | Discharge: 2018-12-29 | Disposition: A | Discharge status: Home / Self Care | Payer: Medicare HMO | Attending: Emergency Medicine | Admitting: Emergency Medicine

## 2018-12-29 DIAGNOSIS — M549 Dorsalgia, unspecified: Secondary | ICD-10-CM | POA: Diagnosis not present

## 2018-12-29 DIAGNOSIS — E785 Hyperlipidemia, unspecified: Secondary | ICD-10-CM | POA: Insufficient documentation

## 2018-12-29 DIAGNOSIS — Z8781 Personal history of (healed) traumatic fracture: Secondary | ICD-10-CM | POA: Diagnosis not present

## 2018-12-29 DIAGNOSIS — R109 Unspecified abdominal pain: Secondary | ICD-10-CM | POA: Diagnosis not present

## 2018-12-29 DIAGNOSIS — F0390 Unspecified dementia without behavioral disturbance: Secondary | ICD-10-CM | POA: Diagnosis not present

## 2018-12-29 DIAGNOSIS — N390 Urinary tract infection, site not specified: Secondary | ICD-10-CM | POA: Insufficient documentation

## 2018-12-29 DIAGNOSIS — M545 Low back pain: Secondary | ICD-10-CM | POA: Insufficient documentation

## 2018-12-29 DIAGNOSIS — I10 Essential (primary) hypertension: Secondary | ICD-10-CM | POA: Diagnosis not present

## 2018-12-29 DIAGNOSIS — Z79899 Other long term (current) drug therapy: Secondary | ICD-10-CM | POA: Diagnosis not present

## 2018-12-29 DIAGNOSIS — R52 Pain, unspecified: Secondary | ICD-10-CM

## 2018-12-29 DIAGNOSIS — W19XXXA Unspecified fall, initial encounter: Secondary | ICD-10-CM

## 2018-12-29 DIAGNOSIS — S199XXA Unspecified injury of neck, initial encounter: Secondary | ICD-10-CM | POA: Diagnosis not present

## 2018-12-29 DIAGNOSIS — Y92009 Unspecified place in unspecified non-institutional (private) residence as the place of occurrence of the external cause: Secondary | ICD-10-CM

## 2018-12-29 DIAGNOSIS — S3992XA Unspecified injury of lower back, initial encounter: Secondary | ICD-10-CM | POA: Diagnosis not present

## 2018-12-29 DIAGNOSIS — S299XXA Unspecified injury of thorax, initial encounter: Secondary | ICD-10-CM | POA: Diagnosis not present

## 2018-12-29 DIAGNOSIS — S0990XA Unspecified injury of head, initial encounter: Secondary | ICD-10-CM | POA: Diagnosis not present

## 2018-12-29 LAB — CBC WITH DIFFERENTIAL/PLATELET
Abs Immature Granulocytes: 0.03 10*3/uL (ref 0.00–0.07)
Basophils Absolute: 0.1 10*3/uL (ref 0.0–0.1)
Basophils Relative: 1 %
Eosinophils Absolute: 0.1 10*3/uL (ref 0.0–0.5)
Eosinophils Relative: 1 %
HCT: 42.9 % (ref 36.0–46.0)
Hemoglobin: 12.9 g/dL (ref 12.0–15.0)
Immature Granulocytes: 0 %
Lymphocytes Relative: 19 %
Lymphs Abs: 1.5 10*3/uL (ref 0.7–4.0)
MCH: 29.6 pg (ref 26.0–34.0)
MCHC: 30.1 g/dL (ref 30.0–36.0)
MCV: 98.4 fL (ref 80.0–100.0)
Monocytes Absolute: 0.7 10*3/uL (ref 0.1–1.0)
Monocytes Relative: 9 %
NEUTROS ABS: 5.6 10*3/uL (ref 1.7–7.7)
NEUTROS PCT: 70 %
Platelets: 209 10*3/uL (ref 150–400)
RBC: 4.36 MIL/uL (ref 3.87–5.11)
RDW: 13.9 % (ref 11.5–15.5)
WBC: 8 10*3/uL (ref 4.0–10.5)
nRBC: 0 % (ref 0.0–0.2)

## 2018-12-29 LAB — PROTIME-INR
INR: 2.27
Prothrombin Time: 24.7 seconds — ABNORMAL HIGH (ref 11.4–15.2)

## 2018-12-29 LAB — URINALYSIS, ROUTINE W REFLEX MICROSCOPIC
Bilirubin Urine: NEGATIVE
Glucose, UA: NEGATIVE mg/dL
Ketones, ur: NEGATIVE mg/dL
Nitrite: POSITIVE — AB
Protein, ur: NEGATIVE mg/dL
Specific Gravity, Urine: 1.013 (ref 1.005–1.030)
pH: 6 (ref 5.0–8.0)

## 2018-12-29 LAB — COMPREHENSIVE METABOLIC PANEL
ALT: 10 U/L (ref 0–44)
AST: 24 U/L (ref 15–41)
Albumin: 3.5 g/dL (ref 3.5–5.0)
Alkaline Phosphatase: 67 U/L (ref 38–126)
Anion gap: 7 (ref 5–15)
BUN: 19 mg/dL (ref 8–23)
CO2: 24 mmol/L (ref 22–32)
Calcium: 8.6 mg/dL — ABNORMAL LOW (ref 8.9–10.3)
Chloride: 103 mmol/L (ref 98–111)
Creatinine, Ser: 0.9 mg/dL (ref 0.44–1.00)
GFR calc Af Amer: >60 mL/min (ref >60)
GFR calc non Af Amer: 57 mL/min — ABNORMAL LOW (ref >60)
Glucose, Bld: 95 mg/dL (ref 70–99)
POTASSIUM: 5.9 mmol/L — AB (ref 3.5–5.1)
SODIUM: 134 mmol/L — AB (ref 135–145)
Total Bilirubin: 0.8 mg/dL (ref 0.3–1.2)
Total Protein: 7.2 g/dL (ref 6.5–8.1)

## 2018-12-29 LAB — BASIC METABOLIC PANEL
Anion gap: 7 (ref 5–15)
BUN: 18 mg/dL (ref 8–23)
CO2: 23 mmol/L (ref 22–32)
Calcium: 8.6 mg/dL — ABNORMAL LOW (ref 8.9–10.3)
Chloride: 106 mmol/L (ref 98–111)
Creatinine, Ser: 0.88 mg/dL (ref 0.44–1.00)
GFR calc Af Amer: >60 mL/min (ref >60)
GFR calc non Af Amer: 58 mL/min — ABNORMAL LOW (ref >60)
Glucose, Bld: 94 mg/dL (ref 70–99)
Potassium: 4.8 mmol/L (ref 3.5–5.1)
Sodium: 136 mmol/L (ref 135–145)

## 2018-12-29 LAB — LACTIC ACID, PLASMA
Lactic Acid, Venous: 1.6 mmol/L (ref 0.5–1.9)
Lactic Acid, Venous: 1.4 mmol/L (ref 0.5–1.9)

## 2018-12-29 LAB — TROPONIN I: Troponin I: <0.03 ng/mL (ref <0.03)

## 2018-12-29 LAB — LIPASE, BLOOD: Lipase: 46 U/L (ref 11–51)

## 2018-12-29 MED ORDER — SODIUM CHLORIDE 0.9 % IV SOLN
1.0000 g | Freq: Once | INTRAVENOUS | Status: AC
  Administered 2018-12-29: 1 g via INTRAVENOUS
  Filled 2018-12-29: qty 10

## 2018-12-29 MED ORDER — NITROFURANTOIN MONOHYD MACRO 100 MG PO CAPS: 100.0000 mg | ORAL_CAPSULE | Freq: Two times a day (BID) | ORAL | 0 refills | Status: DC

## 2018-12-29 NOTE — ED Provider Notes (Signed)
[facility] EMERGENCY DEPARTMENT Provider Note   CSN: [id number] Arrival date & time: 12/29/18  1408     History   Chief Complaint Chief Complaint  Patient presents with  . Fall    HPI [name] is a 85 y.o. female.  The history is provided by the patient and a caregiver. History limited by: Hx dementia.  Fall     Pt was seen at 1505. Per pt's family and pt: Pt fell 1 week ago and hit her head on a counter. No reported LOC or AMS from baseline. Pt's family states over the week, pt has c/o low back pain, as well as vague abd pain. Family does not believe pt fell on her abd or back during the fall 1 week ago. Family states pt has also been "more weak" than usual for the past several weeks; walks with walker at baseline. Denies fevers, no vomiting/diarrhea, no SOB/cough, no CP, no black or blood in stools, no focal motor weakness.    Past Medical History:  Diagnosis Date  . Acid reflux   . Atrial fibrillation (HCC)   . Atrophic vaginitis   . Chronic cystitis   . Decubitus ulcer of buttock   . Dementia (HCC)   . Hyperlipidemia   . Hypertension   . Incomplete bladder emptying   . Microscopic hematuria   . Pacemaker   . Straining on urination   . Symptomatic bradycardia   . Urge incontinence of urine   . UTI (lower urinary tract infection)     Patient Active Problem List   Diagnosis Date Noted  . Pressure injury of skin 12/05/2017  . Metabolic encephalopathy 12/04/2017  . Hyperlipidemia 11/14/2013  . Mitral insufficiency 11/14/2013  . Symptomatic bradycardia 07/05/2013  . Pacemaker - Medtronic 2007, new generator 2014 07/05/2013  . Atrial fibrillation (HCC) 03/15/2013  . Long term current use of anticoagulant therapy 03/15/2013    Past Surgical History:  Procedure Laterality Date  . CARDIAC CATHETERIZATION  04/2004  . PACEMAKER GENERATOR CHANGE N/A 07/05/2013   Procedure: PACEMAKER GENERATOR CHANGE;  Surgeon: Thurmon Fair, MD;  Location: [facility] CATH LAB;   Service: Cardiovascular;  Laterality: N/A;  . PACEMAKER INSERTION       OB History   No obstetric history on file.      Home Medications    Prior to Admission medications   Medication Sig Start Date End Date Taking? Authorizing Provider  cephALEXin (KEFLEX) 250 MG capsule Take 250 mg by mouth at bedtime.   Yes [provider]  furosemide (LASIX) 40 MG tablet Take 20 mg by mouth daily. Prescribed 40mg  twice daily* 09/25/14  Yes [provider]  metoprolol succinate (TOPROL-XL) 25 MG 24 hr tablet TAKE 1 AND 1/2 TABLETS BY MOUTH ONCE DAILY. 10/23/18  Yes Croitoru, Mihai, MD  Multiple Vitamins-Minerals (CENTRUM SILVER PO) Take 1 tablet by mouth daily.     Yes [provider]  omeprazole (PRILOSEC) 20 MG capsule Take 20 mg by mouth daily.     Yes [provider]  potassium chloride SA (K-DUR,KLOR-CON) 20 MEQ tablet Take 1 tablet by mouth daily. 09/24/14  Yes [provider]  simvastatin (ZOCOR) 10 MG tablet Take 10 mg by mouth daily.   Yes [provider]  Travoprost, BAK Free, (TRAVATAN Z) 0.004 % SOLN ophthalmic solution Place 1 drop into both eyes at bedtime. 06/13/17  Yes [provider]  warfarin (COUMADIN) 3 MG tablet TAKE 1/2 TO 1 TABLET BY MOUTH DAILY AS DIRECTED.  Patient taking differently: Take 3 mg by mouth daily. Take a 1/2 tablet on Monday and whole table on the other days 10/24/18  Yes Croitoru, Mihai, MD  loratadine (CLARITIN) 10 MG tablet Take 10 mg by mouth daily as needed for allergies.    [provider]  nitrofurantoin, macrocrystal-monohydrate, (MACROBID) 100 MG capsule  11/13/18   [provider]  nystatin (MYCOSTATIN/NYSTOP) powder Apply topically 4 (four) times daily. 06/06/18   Eber HongMiller, Brian, MD  ondansetron (ZOFRAN ODT) 4 MG disintegrating tablet Take 1 tablet (4 mg total) by mouth every 8 (eight) hours as needed for nausea. 06/06/18   Eber HongMiller, Brian, MD    Family History Family History    Problem Relation Age of Onset  . Cancer Other     Social History Social History   Tobacco Use  . Smoking status: Never Smoker  . Smokeless tobacco: Never Used  Substance Use Topics  . Alcohol use: No  . Drug use: No     Allergies   Contrast media [iodinated diagnostic agents]; Penicillins; and Sulfa antibiotics   Review of Systems Review of Systems  Unable to perform ROS: Dementia     Physical Exam Updated Vital Signs BP 131/65 (BP Location: Left Arm)   Pulse 69   Temp 97.6 F (36.4 C) (Oral)   Resp 14   Ht 5' (1.524 m)   Wt 71.7 kg   SpO2 97%   BMI 30.86 kg/m    Patient Vitals for the past 24 hrs:  BP Temp Temp src Pulse Resp SpO2 Height Weight  12/29/18 1730 (!) 139/48 - - 63 19 94 % - -  12/29/18 1700 (!) 145/55 - - (!) 49 (!) 26 94 % - -  12/29/18 1600 (!) 149/42 - - 74 (!) 22 96 % - -  12/29/18 1530 (!) 145/40 - - 61 19 97 % - -  12/29/18 1434 - - - - - - 5' (1.524 m) 71.7 kg  12/29/18 1432 131/65 97.6 F (36.4 C) Oral 69 14 97 % - -    17:30 Orthostatic Vital Signs LA  Orthostatic Lying   BP- Lying: 145/55  Pulse- Lying: 70      Orthostatic Sitting  BP- Sitting: 139/48  Pulse- Sitting: 77      Orthostatic Standing at 0 minutes  BP- Standing at 0 minutes: 143/86  Pulse- Standing at 0 minutes: 81     Physical Exam 1510: Physical examination:  Nursing notes reviewed; Vital signs and O2 SAT reviewed;  Constitutional: Well developed, Well nourished, In no acute distress; Head:  Normocephalic, atraumatic; Eyes: EOMI, PERRL, No scleral icterus; ENMT: Mouth and pharynx normal, Mucous membranes dry;; Neck: Supple, Full range of motion; Cardiovascular: Regular rate and rhythm, No gallop; Respiratory: Breath sounds clear & equal bilaterally, No wheezes.  Speaking full sentences with ease, Normal respiratory effort/excursion; Chest: Nontender, Movement normal; Abdomen: Soft, Nontender, Nondistended, Normal bowel sounds; Genitourinary: No CVA  tenderness; Spine:  No midline CS, TS, LS tenderness. +TTP bilat lumbar paraspinal muscles.;; Extremities: Peripheral pulses normal, No tenderness, Pelvis stable. Pt is able to lift extended bilat LE's up off stretcher without apparent pain. NT bilat hips/knees/ankle. No deformity. No calf edema or asymmetry.; Neuro: Awake, alert, confused per hx dementia. No facial droop. Speech minimal but clear. Grips equal. No gross focal motor deficits in extremities.; Skin: Color normal, Warm, Dry.    ED Treatments / Results  Labs (all labs ordered are listed, but only abnormal results are displayed)  EKG EKG Interpretation  Date/Time:  Saturday December 29 2018 15:26:53 EST Ventricular Rate:  63 PR Interval:    QRS Duration: 126 QT Interval:  410 QTC Calculation: 443 R Axis:   -27 Text Interpretation:  Ventricular-paced complexes No further analysis attempted due to paced rhythm When compared with ECG of 11/24/2018 No significant change was found Confirmed by Samuel Jester [phone]) on 12/29/2018 3:48:03 PM   Radiology   Procedures Procedures (including critical care time)  Medications Ordered in ED Medications - No data to display   Initial Impression / Assessment and Plan / ED Course  I have reviewed the triage vital signs and the nursing notes.  Pertinent labs & imaging results that were available during my care of the patient were reviewed by me and considered in my medical decision making (see chart for details).  MDM Reviewed: previous chart, nursing note and vitals Reviewed previous: labs and ECG Interpretation: labs, ECG, x-ray and CT scan    Results for orders placed or performed during the hospital encounter of 12/29/18  Urinalysis, Routine w reflex microscopic  Result Value Ref Range   Color, Urine YELLOW YELLOW   APPearance CLOUDY (A) CLEAR   Specific Gravity, Urine 1.013 1.005 - 1.030   pH 6.0 5.0 - 8.0   Glucose, UA NEGATIVE NEGATIVE mg/dL   Hgb urine dipstick  MODERATE (A) NEGATIVE   Bilirubin Urine NEGATIVE NEGATIVE   Ketones, ur NEGATIVE NEGATIVE mg/dL   Protein, ur NEGATIVE NEGATIVE mg/dL   Nitrite POSITIVE (A) NEGATIVE   Leukocytes, UA LARGE (A) NEGATIVE   RBC / HPF 6-10 0 - 5 RBC/hpf   WBC, UA >50 (H) 0 - 5 WBC/hpf   Bacteria, UA MANY (A) NONE SEEN   WBC Clumps PRESENT    Mucus PRESENT    Hyaline Casts, UA PRESENT   Comprehensive metabolic panel  Result Value Ref Range   Sodium 134 (L) 135 - 145 mmol/L   Potassium 5.9 (H) 3.5 - 5.1 mmol/L   Chloride 103 98 - 111 mmol/L   CO2 24 22 - 32 mmol/L   Glucose, Bld 95 70 - 99 mg/dL   BUN 19 8 - 23 mg/dL   Creatinine, Ser 4.69 0.44 - 1.00 mg/dL   Calcium 8.6 (L) 8.9 - 10.3 mg/dL   Total Protein 7.2 6.5 - 8.1 g/dL   Albumin 3.5 3.5 - 5.0 g/dL   AST 24 15 - 41 U/L   ALT 10 0 - 44 U/L   Alkaline Phosphatase 67 38 - 126 U/L   Total Bilirubin 0.8 0.3 - 1.2 mg/dL   GFR calc non Af Amer 57 (L) >60 mL/min   GFR calc Af Amer >60 >60 mL/min   Anion gap 7 5 - 15  Lipase, blood  Result Value Ref Range   Lipase 46 11 - 51 U/L  Troponin I - Once  Result Value Ref Range   Troponin I <0.03 <0.03 ng/mL  Lactic acid, plasma  Result Value Ref Range   Lactic Acid, Venous 1.6 0.5 - 1.9 mmol/L  Lactic acid, plasma  Result Value Ref Range   Lactic Acid, Venous 1.4 0.5 - 1.9 mmol/L  CBC with Differential  Result Value Ref Range   WBC 8.0 4.0 - 10.5 K/uL   RBC 4.36 3.87 - 5.11 MIL/uL   Hemoglobin 12.9 12.0 - 15.0 g/dL   HCT 62.9 52.8 - 41.3 %   MCV 98.4 80.0 - 100.0 fL   MCH 29.6 26.0 - 34.0 pg  MCHC 30.1 30.0 - 36.0 g/dL   RDW 16.1 09.6 - 04.5 %   Platelets 209 150 - 400 K/uL   nRBC 0.0 0.0 - 0.2 %   Neutrophils Relative % 70 %   Neutro Abs 5.6 1.7 - 7.7 K/uL   Lymphocytes Relative 19 %   Lymphs Abs 1.5 0.7 - 4.0 K/uL   Monocytes Relative 9 %   Monocytes Absolute 0.7 0.1 - 1.0 K/uL   Eosinophils Relative 1 %   Eosinophils Absolute 0.1 0.0 - 0.5 K/uL   Basophils Relative 1 %   Basophils  Absolute 0.1 0.0 - 0.1 K/uL   Immature Granulocytes 0 %   Abs Immature Granulocytes 0.03 0.00 - 0.07 K/uL  Protime-INR  Result Value Ref Range   Prothrombin Time 24.7 (H) 11.4 - 15.2 seconds   INR 2.27    Ct Abdomen Pelvis Wo Contrast Result Date: 12/29/2018 CLINICAL DATA:  Abdominal pain. EXAM: CT ABDOMEN AND PELVIS WITHOUT CONTRAST TECHNIQUE: Multidetector CT imaging of the abdomen and pelvis was performed following the standard protocol without IV contrast. COMPARISON:  05/03/2011 FINDINGS: Lower chest: Moderate enlargement of the heart. Dense three-vessel coronary artery calcifications. Trace pleural effusion on the right. There is dependent lung base opacities, most likely atelectasis. Hepatobiliary: Liver normal size attenuation. No mass or focal lesion. Gallbladder appears to be decompressed. No bile duct dilation. Pancreas: Generalized pancreatic atrophy.  No mass or inflammation. Spleen: Normal in size without focal abnormality. Adrenals/Urinary Tract: No adrenal masses. Bilateral renal cortical thinning. No renal masses or stones. No hydronephrosis. Ureters normal in course and in caliber. Bladder is unremarkable. Stomach/Bowel: Stomach is unremarkable. Small bowel and colon are normal in caliber. No wall thickening or inflammation. No evidence of appendicitis. Vascular/Lymphatic: Dense aortic atherosclerotic calcifications with atherosclerotic calcifications extending into the branch vessels. No aneurysm. No enlarged lymph nodes. Reproductive: Status post hysterectomy. No adnexal masses. Other: No abdominal wall hernia or abnormality. No abdominopelvic ascites. Musculoskeletal: Mild compression fracture of T11. Mild compression fracture of L1. These fractures are stable from the prior CT. No acute fractures. Skeletal structures are diffusely demineralized. No osteoblastic or osteolytic lesions. IMPRESSION: 1. No acute findings within the abdomen or pelvis. 2. No bowel obstruction or inflammation.  3. Extensive aortic atherosclerosis. 4. Chronic compression fractures of T11 and L1.  No acute fractures. Electronically Signed   By: Amie Portland M.D.   On: 12/29/2018 16:53   Dg Chest 2 View Result Date: 12/29/2018 CLINICAL DATA:  Acute fall and back pain. Initial encounter. EXAM: CHEST - 2 VIEW COMPARISON:  11/24/2018 FINDINGS: This is a mildly low volume film. Cardiomegaly and LEFT-sided pacemaker again noted. Bibasilar atelectasis/scarring again noted. LOWER thoracic compression fractures appear unchanged. No definite pleural effusion or pneumothorax. IMPRESSION: Unchanged appearance of the chest with cardiomegaly and bibasilar atelectasis/scarring. Electronically Signed   By: Harmon Pier M.D.   On: 12/29/2018 16:46   Ct Head Wo Contrast Result Date: 12/29/2018 CLINICAL DATA:  85 year old female with acute fall and head and neck injury one week ago. Initial encounter. EXAM: CT HEAD WITHOUT CONTRAST CT CERVICAL SPINE WITHOUT CONTRAST TECHNIQUE: Multidetector CT imaging of the head and cervical spine was performed following the standard protocol without intravenous contrast. Multiplanar CT image reconstructions of the cervical spine were also generated. COMPARISON:  11/24/2018 and prior CTs FINDINGS: CT HEAD FINDINGS Brain: No evidence of acute infarction, hemorrhage, hydrocephalus, extra-axial collection or mass lesion/mass effect. Atrophy and chronic small-vessel white matter ischemic changes again noted. Vascular: Carotid atherosclerotic calcifications  again noted. Skull: No acute abnormality Sinuses/Orbits: No acute abnormality. Other: None CT CERVICAL SPINE FINDINGS Alignment: Normal. Skull base and vertebrae: No acute fracture. No primary bone lesion or focal pathologic process. Soft tissues and spinal canal: No prevertebral fluid or swelling. No visible canal hematoma. Disc levels: Multilevel degenerative disc disease spondylosis throughout the cervical spine again noted, greatest at C5-6. Mild  multilevel facet arthropathy identified. These degenerative changes contribute to central spinal and bony foraminal narrowing at several levels, mild to moderate at C5-6. Upper chest: No acute abnormality. Other: None IMPRESSION: 1. No evidence of acute intracranial abnormality. Atrophy and chronic small-vessel white matter ischemic changes. 2. No static evidence of acute injury to the cervical spine. Multilevel degenerative changes again noted. Electronically Signed   By: Harmon Pier M.D.   On: 12/29/2018 16:55   Ct Cervical Spine Wo Contrast Result Date: 12/29/2018 CLINICAL DATA:  85 year old female with acute fall and head and neck injury one week ago. Initial encounter. EXAM: CT HEAD WITHOUT CONTRAST CT CERVICAL SPINE WITHOUT CONTRAST TECHNIQUE: Multidetector CT imaging of the head and cervical spine was performed following the standard protocol without intravenous contrast. Multiplanar CT image reconstructions of the cervical spine were also generated. COMPARISON:  11/24/2018 and prior CTs FINDINGS: CT HEAD FINDINGS Brain: No evidence of acute infarction, hemorrhage, hydrocephalus, extra-axial collection or mass lesion/mass effect. Atrophy and chronic small-vessel white matter ischemic changes again noted. Vascular: Carotid atherosclerotic calcifications again noted. Skull: No acute abnormality Sinuses/Orbits: No acute abnormality. Other: None CT CERVICAL SPINE FINDINGS Alignment: Normal. Skull base and vertebrae: No acute fracture. No primary bone lesion or focal pathologic process. Soft tissues and spinal canal: No prevertebral fluid or swelling. No visible canal hematoma. Disc levels: Multilevel degenerative disc disease spondylosis throughout the cervical spine again noted, greatest at C5-6. Mild multilevel facet arthropathy identified. These degenerative changes contribute to central spinal and bony foraminal narrowing at several levels, mild to moderate at C5-6. Upper chest: No acute abnormality. Other:  None IMPRESSION: 1. No evidence of acute intracranial abnormality. Atrophy and chronic small-vessel white matter ischemic changes. 2. No static evidence of acute injury to the cervical spine. Multilevel degenerative changes again noted. Electronically Signed   By: Harmon Pier M.D.   On: 12/29/2018 16:55   Ct L-spine No Charge Result Date: 12/29/2018 CLINICAL DATA:  85 year old female with fall 1 week ago. Back pain. EXAM: CT LUMBAR SPINE WITHOUT CONTRAST TECHNIQUE: Technique: Multiplanar CT images of the lumbar spine were reconstructed from contemporary CT of the Abdomen and Pelvis. CONTRAST:  None COMPARISON:  CT Abdomen and Pelvis today reported separately. Lumbar radiographs 01/10/2018, CT Abdomen and Pelvis 09/09/2016. FINDINGS: Segmentation: Normal. Alignment: Chronic levoconvex lumbar scoliosis with relatively preserved lumbar lordosis appears stable since 2019. Vertebrae: Diffuse osteopenia. Chronic compression fractures of T11, L1, L2 (subtle, inferior endplate), and L3 (mild). Stable lower thoracic and lumbar vertebral height elsewhere. The visible sacrum appears stable and intact. Stable visible SI joints. No acute osseous abnormality identified. Paraspinal and other soft tissues: No paraspinal soft tissue injury identified. Aortoiliac calcified atherosclerosis. And other abdominal visceral findings are reported separately. Disc levels: Diffusely advanced lower thoracic and lumbar spine degeneration with widespread vacuum disc and endplate spurring appears stable since 2017. Superimposed moderate to severe lower lumbar posterior element hypertrophy appears stable. There is mild to moderate degenerative lumbar spinal stenosis suspected at L4-L5. IMPRESSION: 1. No acute osseous abnormality in the lumbar spine. Osteopenia with chronic T11, L1, L2, and L3 compression fractures. Visible lower thoracic  levels and sacrum also appear intact. 2. Lower thoracic and lumbar spine degeneration appears stable since  2017. 3.  CT Abdomen and Pelvis today reported separately. Electronically Signed   By: Odessa FlemingH  Hall M.D.   On: 12/29/2018 17:00    1945:   +UTI, UC pending; IV rocephin given. Previous UC 10/2018 +Ecoli sensitive to macrobid; will rx same. Potassium hemolyzed; repeated and was normal. Pt not orthostatic on VS. Pt has ambulated with her walker with steady/baseline gait per family observing. Pt has tol PO well without N/V. No clear indication for admission at this time. Family would like to take pt home now. Dx and testing d/w pt and family.  Questions answered.  Verb understanding, agreeable to d/c home with outpt f/u.   Final Clinical Impressions(s) / ED Diagnoses   Final diagnoses:  None    ED Discharge Orders    None       Samuel JesterMcManus, Mathius Birkeland, DO 01/02/19 [phone]

## 2018-12-29 NOTE — ED Notes (Signed)
Pt ambulated into hallway, aprox 50 ft, slow but steady gait with front wheel Waylon Hershey, stand by assist, Dr. Clarene Duke notified

## 2018-12-29 NOTE — ED Triage Notes (Signed)
Pt c/o back pain that began after a fall a week ago. Family reports that she fell last Saturday and hit her head on the counter.

## 2018-12-29 NOTE — Discharge Instructions (Addendum)
Take the prescription as directed. Walk with your walker. Move slowly when changing positions. Keep yourself hydrated and eat regular meals. Call your regular medical doctor on Monday to schedule a follow up appointment within the next 2 days.  Return to the Emergency Department immediately sooner if worsening.

## 2018-12-29 NOTE — ED Notes (Signed)
Patient transported to CT 

## 2019-01-01 ENCOUNTER — Encounter: Payer: Self-pay | Admitting: Cardiovascular Disease

## 2019-01-01 ENCOUNTER — Ambulatory Visit (INDEPENDENT_AMBULATORY_CARE_PROVIDER_SITE_OTHER): Payer: Medicare HMO | Admitting: Cardiovascular Disease

## 2019-01-01 ENCOUNTER — Ambulatory Visit: Payer: Medicare HMO | Admitting: Pharmacist Clinician (PhC)/ Clinical Pharmacy Specialist

## 2019-01-01 VITALS — BP 134/60 | HR 79 | Ht 60.0 in | Wt 153.6 lb

## 2019-01-01 DIAGNOSIS — Z7901 Long term (current) use of anticoagulants: Secondary | ICD-10-CM

## 2019-01-01 DIAGNOSIS — F0390 Unspecified dementia without behavioral disturbance: Secondary | ICD-10-CM

## 2019-01-01 DIAGNOSIS — E7800 Pure hypercholesterolemia, unspecified: Secondary | ICD-10-CM | POA: Diagnosis not present

## 2019-01-01 DIAGNOSIS — I340 Nonrheumatic mitral (valve) insufficiency: Secondary | ICD-10-CM | POA: Diagnosis not present

## 2019-01-01 DIAGNOSIS — I4891 Unspecified atrial fibrillation: Secondary | ICD-10-CM | POA: Diagnosis not present

## 2019-01-01 DIAGNOSIS — I4821 Permanent atrial fibrillation: Secondary | ICD-10-CM

## 2019-01-01 LAB — POCT INR: INR: 2.9 (ref 2.0–3.0)

## 2019-01-01 NOTE — Patient Instructions (Signed)
Medication Instructions:  Your Physician recommend you continue on your current medication as directed.    If you need a refill on your cardiac medications before your next appointment, please call your pharmacy.   Lab work: None  Testing/Procedures: None  Follow-Up: At [facility], you and your health needs are our priority.  As part of our continuing mission to provide you with exceptional heart care, we have created designated Provider Care Teams.  These Care Teams include your primary Cardiologist (physician) and Advanced Practice Providers (APPs -  Physician Assistants and Nurse Practitioners) who all work together to provide you with the care you need, when you need it. You will need a follow up appointment in 1 years.  Please call our office 2 months in advance to schedule this appointment.  You may see Dr. Royann Shivers or one of the following Advanced Practice Providers on your designated Care Team: Azalee Course, [place] . Micah Flesher, PA-C

## 2019-01-01 NOTE — Progress Notes (Signed)
Cardiology Office Note    Date:  01/01/2019   ID:  Norma Picketthelma D Favata, DOB June 20, 1927, MRN [id number]  PCP:  Avon GullyFanta, Tesfaye, MD  Cardiologist:   Thurmon FairMihai Latonda Larrivee, MD   Chief Complaint  Patient presents with  . Atrial Fibrillation  . Pacemaker Check    History of Present Illness:  [name] is a 85 y.o. female with long-standing permanent atrial fibrillation with slow ventricular response and a single chamber permanent pacemaker, here for routine follow-up. She is is accompanied as always by her daughter.  She has progressive dementia, is clearly disoriented and communicates less and less.  The review of system is obtained exclusively from her daughter.  She had another recent fall and did hit the back of her head.  She went to the emergency room and a CT was negative for any intracranial hemorrhage.  There was no evidence of orthostatic hypotension.  Her ECG showed ventricular pacing.    Also had CT of the abdomen that showed chronic compression fractures in T11-L1-L2-L3, but no acute abnormalities.  Extensive aortic atherosclerosis is described. She has had some bilateral flank pain and has an active urinary tract infection.  She was switched to a different antibiotic.   Interrogation of her pacemaker shows that her Medtronic AmasaSensia  device implanted in 2014 still has roughly 8 years of generator life expectancy.  She has 50% ventricular pacing.    Labs performed about a year ago showed an LDL of 70, HDL 59, total cholesterol 409146, triglycerides 87.   Past Medical History:  Diagnosis Date  . Acid reflux   . Atrial fibrillation (HCC)   . Atrophic vaginitis   . Chronic cystitis   . Decubitus ulcer of buttock   . Dementia (HCC)   . Hyperlipidemia   . Hypertension   . Incomplete bladder emptying   . Microscopic hematuria   . Pacemaker   . Straining on urination   . Symptomatic bradycardia   . Urge incontinence of urine   . UTI (lower urinary tract infection)     Past Surgical  History:  Procedure Laterality Date  . CARDIAC CATHETERIZATION  04/2004  . PACEMAKER GENERATOR CHANGE N/A 07/05/2013   Procedure: PACEMAKER GENERATOR CHANGE;  Surgeon: Thurmon FairMihai Koleton Duchemin, MD;  Location: [facility]ATH [facility];  Service: Cardiovascular;  Laterality: N/A;  . PACEMAKER INSERTION      Current Medications: Outpatient Medications Prior to Visit  Medication Sig Dispense Refill  . furosemide (LASIX) 40 MG tablet Take 20 mg by mouth daily. Prescribed 40mg  twice daily*    . loratadine (CLARITIN) 10 MG tablet Take 10 mg by mouth daily as needed for allergies.    . metoprolol succinate (TOPROL-XL) 25 MG 24 hr tablet TAKE 1 AND 1/2 TABLETS BY MOUTH ONCE DAILY. 135 tablet 0  . Multiple Vitamins-Minerals (CENTRUM SILVER PO) Take 1 tablet by mouth daily.      . nitrofurantoin, macrocrystal-monohydrate, (MACROBID) 100 MG capsule Take 1 capsule (100 mg total) by mouth 2 (two) times daily. 14 capsule 0  . nystatin (MYCOSTATIN/NYSTOP) powder Apply topically 4 (four) times daily. 15 g 0  . omeprazole (PRILOSEC) 20 MG capsule Take 20 mg by mouth daily.      . ondansetron (ZOFRAN ODT) 4 MG disintegrating tablet Take 1 tablet (4 mg total) by mouth every 8 (eight) hours as needed for nausea. 10 tablet 0  . potassium chloride SA (K-DUR,KLOR-CON) 20 MEQ tablet Take 1 tablet by mouth daily.    . simvastatin (ZOCOR) 10  MG tablet Take 10 mg by mouth daily.    . Travoprost, BAK Free, (TRAVATAN Z) 0.004 % SOLN ophthalmic solution Place 1 drop into both eyes at bedtime.    Marland Kitchen warfarin (COUMADIN) 3 MG tablet TAKE 1/2 TO 1 TABLET BY MOUTH DAILY AS DIRECTED. (Patient taking differently: Take 3 mg by mouth daily. Take a 1/2 tablet on Monday and whole table on the other days) 90 tablet 0  . cephALEXin (KEFLEX) 250 MG capsule Take 250 mg by mouth at bedtime.     No facility-administered medications prior to visit.      Allergies:   Contrast media [iodinated diagnostic agents]; Penicillins; and Sulfa antibiotics   Social  History   Socioeconomic History  . Marital status: Widowed    Spouse name: Not on file  . Number of children: Not on file  . Years of education: Not on file  . Highest education level: Not on file  Occupational History  . Not on file  Social Needs  . Financial resource strain: Not on file  . Food insecurity:    Worry: Not on file    Inability: Not on file  . Transportation needs:    Medical: Not on file    Non-medical: Not on file  Tobacco Use  . Smoking status: Never Smoker  . Smokeless tobacco: Never Used  Substance and Sexual Activity  . Alcohol use: No  . Drug use: No  . Sexual activity: Never  Lifestyle  . Physical activity:    Days per week: Not on file    Minutes per session: Not on file  . Stress: Not on file  Relationships  . Social connections:    Talks on phone: Not on file    Gets together: Not on file    Attends religious service: Not on file    Active member of club or organization: Not on file    Attends meetings of clubs or organizations: Not on file    Relationship status: Not on file  Other Topics Concern  . Not on file  Social History Narrative  . Not on file     Family History:  The patient's family history includes Cancer in an other family member.   ROS:   Please see the history of present illness.    ROS all other systems are reviewed and are negative   PHYSICAL EXAM:   VS:  BP 134/60   Pulse 79   Ht 5' (1.524 m)   Wt 153 lb 9.6 oz (69.7 kg)   SpO2 95%   BMI 30.00 kg/m      General: Alert, no distress, clearly very disoriented Head: no evidence of trauma, PERRL, EOMI, no exophtalmos or lid lag, no myxedema, no xanthelasma; normal ears, nose and oropharynx Neck: normal jugular venous pulsations and no hepatojugular reflux; brisk carotid pulses without delay and no carotid bruits Chest: clear to auscultation, no signs of consolidation by percussion or palpation, normal fremitus, symmetrical and full respiratory  excursions Cardiovascular: normal position and quality of the apical impulse, regular rhythm, normal first and second heart sounds, 2/6 early peaking systolic ejection murmur in the aortic focus, no diastolic murmurs, rubs or gallops.  Healthy subclavian pacemaker site Abdomen: no tenderness or distention, no masses by palpation, no abnormal pulsatility or arterial bruits, normal bowel sounds, no hepatosplenomegaly Extremities: no clubbing, cyanosis or edema; 2+ radial, ulnar and brachial pulses bilaterally; 2+ right femoral, posterior tibial and dorsalis pedis pulses; 2+ left femoral, posterior tibial and  dorsalis pedis pulses; no subclavian or femoral bruits Neurological: grossly nonfocal, minimal verbal interaction Psych: Normal mood and affect    Wt Readings from Last 3 Encounters:  01/01/19 153 lb 9.6 oz (69.7 kg)  12/29/18 158 lb (71.7 kg)  11/23/18 157 lb 6.5 oz (71.4 kg)      Studies/Labs Reviewed:   EKG:  EKG is not ordered today.  ECG performed December 29, 2018 shows mostly ventricular paced rhythm, a couple of fusion beats, with background atrial fibrillation ASSESSMENT:    1. Long term (current) use of anticoagulants   2. Permanent atrial fibrillation   3. Atrial fibrillation with slow ventricular response (HCC)   4. Nonrheumatic mitral valve regurgitation   5. Hypercholesterolemia   6. Dementia without behavioral disturbance, unspecified dementia type (HCC)      PLAN:  In order of problems listed above:  1. AFib: She has had 1 more fall, but the falls have not been particularly frequent.  No evidence of intracranial hemorrhage.  I think for the time being she should remain on anticoagulation, but there is a low threshold for discontinuing this, if she has further falls and injuries.  Rate control is adequate. 2. PPM: Mode downloads every 3 months.  As for dementia worsens we may have to cut back on the yearly office visits. 3. Warfarin: CHADSVasc 4.  Benefit of  warfarin still appears to be favorable but need to periodically reassess this.   4. Moderate MR: She is definitely not a surgical candidate.  No overt evidence of heart failure.    5. HLP: All lipid parameters in desirable range when checked about a year ago. 6. Dementia: Functional status has deteriorated substantially.  Medication Adjustments/Labs and Tests Ordered: Current medicines are reviewed at length with the patient today.  Concerns regarding medicines are outlined above.  Medication changes, Labs and Tests ordered today are listed in the Patient Instructions below. Patient Instructions  Medication Instructions:  Your Physician recommend you continue on your current medication as directed.    If you need a refill on your cardiac medications before your next appointment, please call your pharmacy.   Lab work: None  Testing/Procedures: None  Follow-Up: At [facility], you and your health needs are our priority.  As part of our continuing mission to provide you with exceptional heart care, we have created designated Provider Care Teams.  These Care Teams include your primary Cardiologist (physician) and Advanced Practice Providers (APPs -  Physician Assistants and Nurse Practitioners) who all work together to provide you with the care you need, when you need it. You will need a follow up appointment in 1 years.  Please call our office 2 months in advance to schedule this appointment.  You may see Dr. Royann Shivers or one of the following Advanced Practice Providers on your designated Care Team: Azalee Course, [place] . Micah Flesher, PA-C       Signed, Thurmon Fair, MD  01/01/2019 10:49 AM    [facility] [address] [place], [place], [place]  [address] Phone: [phone]; Fax: [phone]

## 2019-01-02 LAB — CARBAPENEM RESISTANCE PANEL
CARBA RESISTANCE KPC GENE: NOT DETECTED
Carba Resistance IMP Gene: NOT DETECTED
Carba Resistance NDM Gene: NOT DETECTED
Carba Resistance OXA48 Gene: NOT DETECTED
Carba Resistance VIM Gene: NOT DETECTED

## 2019-01-02 LAB — URINE CULTURE: Culture: >=100000 — AB

## 2019-01-03 ENCOUNTER — Telehealth: Payer: Self-pay

## 2019-01-03 NOTE — Telephone Encounter (Signed)
Post ED Visit - Positive Culture Follow-up  Culture report reviewed by antimicrobial stewardship pharmacist:  []  Enzo Bi, Pharm.D. []  Celedonio Miyamoto, Pharm.D., BCPS AQ-ID []  Garvin Fila, Pharm.D., BCPS []  Georgina Pillion, Pharm.D., BCPS []  [place], [address].D., BCPS, AAHIVP []  Estella Husk, Pharm.D., BCPS, AAHIVP []  Lysle Pearl, PharmD, BCPS []  Phillips Climes, PharmD, BCPS []  Agapito Games, PharmD, BCPS []  Verlan Friends, PharmD H Barid Pharm D Positive urine culture Treated with Microbid, organism sensitive to the same and no further patient follow-up is required at this time.  Jerry Caras 01/03/2019, 9:32 AM

## 2019-01-04 LAB — CUP PACEART INCLINIC DEVICE CHECK
Date Time Interrogation Session: 20200207105105
Implantable Lead Implant Date: 20071023
Implantable Lead Implant Date: 20071023
Implantable Lead Location: 753859
Implantable Lead Location: 753860
Implantable Lead Model: 4092
Implantable Lead Model: 5594
Implantable Pulse Generator Implant Date: 20140808

## 2019-01-07 DIAGNOSIS — N390 Urinary tract infection, site not specified: Secondary | ICD-10-CM | POA: Diagnosis not present

## 2019-01-07 DIAGNOSIS — G300 Alzheimer's disease with early onset: Secondary | ICD-10-CM | POA: Diagnosis not present

## 2019-01-07 DIAGNOSIS — I5022 Chronic systolic (congestive) heart failure: Secondary | ICD-10-CM | POA: Diagnosis not present

## 2019-01-07 DIAGNOSIS — I480 Paroxysmal atrial fibrillation: Secondary | ICD-10-CM | POA: Diagnosis not present

## 2019-01-11 ENCOUNTER — Other Ambulatory Visit: Payer: Self-pay | Admitting: Cardiovascular Disease

## 2019-01-25 ENCOUNTER — Encounter: Payer: Self-pay | Admitting: Cardiology

## 2019-01-30 ENCOUNTER — Encounter: Payer: Self-pay | Admitting: Cardiovascular Disease

## 2019-01-30 ENCOUNTER — Other Ambulatory Visit: Payer: Self-pay | Admitting: Cardiovascular Disease

## 2019-01-30 DIAGNOSIS — I4891 Unspecified atrial fibrillation: Secondary | ICD-10-CM | POA: Diagnosis not present

## 2019-01-30 DIAGNOSIS — Z7901 Long term (current) use of anticoagulants: Secondary | ICD-10-CM | POA: Diagnosis not present

## 2019-01-31 LAB — PROTIME-INR
INR: 3 — ABNORMAL HIGH
INR: 3 — AB (ref 0.9–1.1)
Prothrombin Time: 29.4 s — ABNORMAL HIGH (ref 9.0–11.5)

## 2019-02-07 ENCOUNTER — Telehealth: Payer: Self-pay

## 2019-02-07 ENCOUNTER — Other Ambulatory Visit (INDEPENDENT_AMBULATORY_CARE_PROVIDER_SITE_OTHER): Payer: Medicare HMO

## 2019-02-07 ENCOUNTER — Other Ambulatory Visit: Payer: Self-pay | Admitting: Cardiovascular Disease

## 2019-02-07 DIAGNOSIS — I4821 Permanent atrial fibrillation: Secondary | ICD-10-CM

## 2019-02-07 NOTE — Telephone Encounter (Signed)
Called and spoke w/ pt's daughter regarding overdue coumadin and she stated that they had it drawn at a lab however its not in our system and if it's over 3 days we cannot dose it so another lab for inr will be needed. Daughter is planning on driving to the lab and talking w/ them to see why they didn't fax it over to the [place] office. Daughter stated that the lab the pt goes to is right across the [address] from [facility], so I recommended that the pt go see lisa reid in the [place] office for realtime and accurate results so this problem doesn't occur again.

## 2019-02-07 NOTE — Telephone Encounter (Signed)
Pt's daughter called in and made appt for 3/16 hold refill until seen in the office by lisa

## 2019-02-07 NOTE — Telephone Encounter (Signed)
Called and spoke w/ pt's daughter regarding overdue coumadin and she stated that they had it drawn at a lab however its not in our system and if it's over 3 days we cannot dose it so another lab for inr will be needed. Daughter is planning on driving to the lab and talking w/ them to see why they didn't fax it over to the [place] office. Daughter stated that the lab the pt goes to is right across the st from [place], so I recommended that the pt go see lisa reid in the [place] office for realtime and accurate results so this problem doesn't occur again.  

## 2019-02-07 NOTE — Telephone Encounter (Signed)
Pt's daughter called and stated that she would make an appt for 3/16 at the [place] office to see lisa reid

## 2019-02-07 NOTE — Telephone Encounter (Signed)
Pt daughter calling to speak with Haleigh in coumadin clinic regarding lab work. Call transferred to coumadin clinic at [address] office. Routing message to [employer]

## 2019-02-11 ENCOUNTER — Ambulatory Visit (INDEPENDENT_AMBULATORY_CARE_PROVIDER_SITE_OTHER): Payer: Medicare HMO | Admitting: *Deleted

## 2019-02-11 ENCOUNTER — Other Ambulatory Visit: Payer: Self-pay

## 2019-02-11 DIAGNOSIS — I4821 Permanent atrial fibrillation: Secondary | ICD-10-CM

## 2019-02-11 DIAGNOSIS — I480 Paroxysmal atrial fibrillation: Secondary | ICD-10-CM

## 2019-02-11 LAB — POCT INR: INR: 3.6 — AB (ref 2.0–3.0)

## 2019-02-11 NOTE — Patient Instructions (Signed)
Hold coumadin tonight then decrease dose to 1 tablet daily except 1/2 tablet each Monday and Thursday.  Repeat INR in 4 weeks

## 2019-03-12 ENCOUNTER — Telehealth: Payer: Self-pay | Admitting: *Deleted

## 2019-03-12 NOTE — Telephone Encounter (Signed)
° °  COVID-19 Pre-Screening Questions: ° °• Do you currently have a fever?NO ° ° °• Have you recently travelled on a cruise, internationally, or to [place], [place], [place], [place], [place], or [place], [place] ([employer]) ? NO °•  °• Have you been in contact with someone that is currently pending confirmation of Covid19 testing or has been confirmed to have the Covid19 virus?  NO °•  °Are you currently experiencing fatigue or cough? NO ° ° °   ° ° ° ° °

## 2019-03-13 ENCOUNTER — Other Ambulatory Visit: Payer: Self-pay

## 2019-03-13 ENCOUNTER — Ambulatory Visit (INDEPENDENT_AMBULATORY_CARE_PROVIDER_SITE_OTHER): Payer: Medicare HMO | Admitting: *Deleted

## 2019-03-13 DIAGNOSIS — I4821 Permanent atrial fibrillation: Secondary | ICD-10-CM

## 2019-03-13 DIAGNOSIS — Z5181 Encounter for therapeutic drug level monitoring: Secondary | ICD-10-CM | POA: Diagnosis not present

## 2019-03-13 DIAGNOSIS — Z7901 Long term (current) use of anticoagulants: Secondary | ICD-10-CM | POA: Diagnosis not present

## 2019-03-13 LAB — POCT INR: INR: 2.8 (ref 2.0–3.0)

## 2019-03-13 NOTE — Patient Instructions (Signed)
Continue coumadin 1 tablet daily except 1/2 tablet each Monday and Thursday.  Repeat INR in 4 weeks

## 2019-04-06 DIAGNOSIS — I4821 Permanent atrial fibrillation: Secondary | ICD-10-CM | POA: Diagnosis not present

## 2019-04-06 DIAGNOSIS — Z7901 Long term (current) use of anticoagulants: Secondary | ICD-10-CM | POA: Diagnosis not present

## 2019-04-06 LAB — POCT INR: INR: 2.6 (ref 2.0–3.0)

## 2019-04-08 ENCOUNTER — Ambulatory Visit (INDEPENDENT_AMBULATORY_CARE_PROVIDER_SITE_OTHER): Payer: Medicare HMO | Admitting: Pharmacist

## 2019-04-08 DIAGNOSIS — I480 Paroxysmal atrial fibrillation: Secondary | ICD-10-CM

## 2019-04-08 DIAGNOSIS — Z7901 Long term (current) use of anticoagulants: Secondary | ICD-10-CM | POA: Diagnosis not present

## 2019-04-09 ENCOUNTER — Ambulatory Visit (INDEPENDENT_AMBULATORY_CARE_PROVIDER_SITE_OTHER): Payer: Medicare HMO | Admitting: *Deleted

## 2019-04-09 ENCOUNTER — Other Ambulatory Visit: Payer: Self-pay

## 2019-04-09 DIAGNOSIS — I480 Paroxysmal atrial fibrillation: Secondary | ICD-10-CM | POA: Diagnosis not present

## 2019-04-09 DIAGNOSIS — I482 Chronic atrial fibrillation, unspecified: Secondary | ICD-10-CM

## 2019-04-10 LAB — CUP PACEART REMOTE DEVICE CHECK
Battery Impedance: 647 Ohm
Battery Remaining Longevity: 88 mo
Battery Voltage: 2.78 V
Brady Statistic RV Percent Paced: 39 %
Date Time Interrogation Session: 20200512170221
Implantable Lead Implant Date: 20071023
Implantable Lead Implant Date: 20071023
Implantable Lead Location: 753859
Implantable Lead Location: 753860
Implantable Lead Model: 4092
Implantable Lead Model: 5594
Implantable Pulse Generator Implant Date: 20140808
Lead Channel Impedance Value: 67 Ohm
Lead Channel Impedance Value: 810 Ohm
Lead Channel Pacing Threshold Amplitude: 0.375 V
Lead Channel Pacing Threshold Pulse Width: 0.4 ms
Lead Channel Setting Pacing Amplitude: 2 V
Lead Channel Setting Pacing Pulse Width: 0.4 ms
Lead Channel Setting Sensing Sensitivity: 5.6 mV

## 2019-04-16 ENCOUNTER — Other Ambulatory Visit: Payer: Self-pay | Admitting: Cardiovascular Disease

## 2019-04-23 DIAGNOSIS — H401131 Primary open-angle glaucoma, bilateral, mild stage: Secondary | ICD-10-CM | POA: Diagnosis not present

## 2019-04-26 NOTE — Progress Notes (Signed)
Remote pacemaker transmission.   

## 2019-04-29 ENCOUNTER — Ambulatory Visit (INDEPENDENT_AMBULATORY_CARE_PROVIDER_SITE_OTHER): Payer: Medicare HMO | Admitting: Cardiovascular Disease

## 2019-04-29 DIAGNOSIS — Z7901 Long term (current) use of anticoagulants: Secondary | ICD-10-CM

## 2019-04-29 DIAGNOSIS — I480 Paroxysmal atrial fibrillation: Secondary | ICD-10-CM

## 2019-04-30 ENCOUNTER — Other Ambulatory Visit (HOSPITAL_COMMUNITY)
Admission: RE | Admit: 2019-04-30 | Discharge: 2019-04-30 | Disposition: A | Discharge status: Home / Self Care | Payer: Medicare HMO | Source: Ambulatory Visit | Attending: Internal Medicine | Admitting: Internal Medicine

## 2019-04-30 DIAGNOSIS — R6889 Other general symptoms and signs: Secondary | ICD-10-CM | POA: Insufficient documentation

## 2019-04-30 LAB — URINALYSIS, ROUTINE W REFLEX MICROSCOPIC
Bilirubin Urine: NEGATIVE
Glucose, UA: NEGATIVE mg/dL
Ketones, ur: NEGATIVE mg/dL
Nitrite: POSITIVE — AB
Protein, ur: 100 mg/dL — AB
Specific Gravity, Urine: 1.011 (ref 1.005–1.030)
WBC, UA: >50 WBC/hpf — ABNORMAL HIGH (ref 0–5)
pH: 6 (ref 5.0–8.0)

## 2019-05-03 LAB — URINE CULTURE: Culture: >=100000 — AB

## 2019-05-03 LAB — CARBAPENEM RESISTANCE PANEL
Carba Resistance IMP Gene: NOT DETECTED
Carba Resistance KPC Gene: NOT DETECTED
Carba Resistance NDM Gene: NOT DETECTED
Carba Resistance OXA48 Gene: NOT DETECTED
Carba Resistance VIM Gene: NOT DETECTED

## 2019-05-16 DIAGNOSIS — I4821 Permanent atrial fibrillation: Secondary | ICD-10-CM | POA: Diagnosis not present

## 2019-05-16 DIAGNOSIS — Z7901 Long term (current) use of anticoagulants: Secondary | ICD-10-CM | POA: Diagnosis not present

## 2019-05-16 LAB — POCT INR: INR: 3.1 — AB (ref 2.0–3.0)

## 2019-05-17 ENCOUNTER — Ambulatory Visit (INDEPENDENT_AMBULATORY_CARE_PROVIDER_SITE_OTHER): Payer: Medicare HMO | Admitting: Cardiology

## 2019-05-17 DIAGNOSIS — I480 Paroxysmal atrial fibrillation: Secondary | ICD-10-CM

## 2019-05-17 DIAGNOSIS — Z7901 Long term (current) use of anticoagulants: Secondary | ICD-10-CM

## 2019-05-20 ENCOUNTER — Other Ambulatory Visit: Payer: Self-pay | Admitting: Cardiovascular Disease

## 2019-05-29 DIAGNOSIS — I480 Paroxysmal atrial fibrillation: Secondary | ICD-10-CM | POA: Diagnosis not present

## 2019-05-29 DIAGNOSIS — G301 Alzheimer's disease with late onset: Secondary | ICD-10-CM | POA: Diagnosis not present

## 2019-05-29 DIAGNOSIS — I5022 Chronic systolic (congestive) heart failure: Secondary | ICD-10-CM | POA: Diagnosis not present

## 2019-05-29 DIAGNOSIS — M810 Age-related osteoporosis without current pathological fracture: Secondary | ICD-10-CM | POA: Diagnosis not present

## 2019-05-30 ENCOUNTER — Ambulatory Visit (INDEPENDENT_AMBULATORY_CARE_PROVIDER_SITE_OTHER): Payer: Medicare HMO

## 2019-05-30 DIAGNOSIS — Z7901 Long term (current) use of anticoagulants: Secondary | ICD-10-CM | POA: Diagnosis not present

## 2019-05-30 DIAGNOSIS — I480 Paroxysmal atrial fibrillation: Secondary | ICD-10-CM | POA: Diagnosis not present

## 2019-05-30 LAB — POCT INR: INR: 3.3 — AB (ref 2.0–3.0)

## 2019-05-30 NOTE — Patient Instructions (Signed)
Description   Called spoke with pt's daughter Marcie Bal, advised to have pt skip today's dosage of Coumadin, then start taking 1 tablet daily except 1/2 tablet each Mondays, Thursdays, and Saturdays.  Repeat INR in 2 weeks

## 2019-06-07 ENCOUNTER — Other Ambulatory Visit: Payer: Self-pay

## 2019-06-07 ENCOUNTER — Ambulatory Visit (INDEPENDENT_AMBULATORY_CARE_PROVIDER_SITE_OTHER): Payer: Medicare HMO | Admitting: Urology

## 2019-06-07 DIAGNOSIS — N3941 Urge incontinence: Secondary | ICD-10-CM

## 2019-06-07 DIAGNOSIS — N3020 Other chronic cystitis without hematuria: Secondary | ICD-10-CM | POA: Diagnosis not present

## 2019-06-15 LAB — POCT INR: INR: 2.3 (ref 2.0–3.0)

## 2019-06-17 ENCOUNTER — Ambulatory Visit (INDEPENDENT_AMBULATORY_CARE_PROVIDER_SITE_OTHER): Payer: Medicare HMO | Admitting: Pharmacist

## 2019-06-17 DIAGNOSIS — Z7901 Long term (current) use of anticoagulants: Secondary | ICD-10-CM

## 2019-06-17 DIAGNOSIS — I480 Paroxysmal atrial fibrillation: Secondary | ICD-10-CM

## 2019-06-29 DIAGNOSIS — I5022 Chronic systolic (congestive) heart failure: Secondary | ICD-10-CM | POA: Diagnosis not present

## 2019-06-29 DIAGNOSIS — I10 Essential (primary) hypertension: Secondary | ICD-10-CM | POA: Diagnosis not present

## 2019-07-01 DIAGNOSIS — I4821 Permanent atrial fibrillation: Secondary | ICD-10-CM | POA: Diagnosis not present

## 2019-07-01 DIAGNOSIS — Z7901 Long term (current) use of anticoagulants: Secondary | ICD-10-CM | POA: Diagnosis not present

## 2019-07-01 LAB — POCT INR: INR: 2.3 (ref 2.0–3.0)

## 2019-07-03 ENCOUNTER — Ambulatory Visit (INDEPENDENT_AMBULATORY_CARE_PROVIDER_SITE_OTHER): Payer: Medicare HMO | Admitting: Cardiology

## 2019-07-03 DIAGNOSIS — Z7901 Long term (current) use of anticoagulants: Secondary | ICD-10-CM | POA: Diagnosis not present

## 2019-07-03 DIAGNOSIS — I480 Paroxysmal atrial fibrillation: Secondary | ICD-10-CM

## 2019-07-09 ENCOUNTER — Encounter: Payer: Medicare HMO | Admitting: *Deleted

## 2019-07-11 ENCOUNTER — Other Ambulatory Visit: Payer: Self-pay

## 2019-07-11 NOTE — Patient Outreach (Signed)
EMMIPrevent referred to Davina Green RN 

## 2019-07-12 ENCOUNTER — Other Ambulatory Visit: Payer: Self-pay

## 2019-07-12 NOTE — Patient Outreach (Signed)
[facility] [facility]) Care Management  07/12/2019  [name] 1927/10/14 [id number]  TELEPHONE SCREENING Referral date: 07/11/19 Referral source:  EMMI prevent Referral reason: Offer [facility] care management services/ assess needs Insurance: [employer]  Telephone call to patient regarding  EMMI prevent referral. HIPAA verified with patients daughter and designated party release, [name].  RNCM explained reason for call and discussed and offered [facility] care management services. Daughter states patient does not need [facility] care management services at this time.  RNCM offered to mail patient [facility] care management brochure/ magnet for future reference. Daughter verbalized agreement. Patients mailing address confirmed with daughter.   PLAN: RNCM will close case due to refusal of services.  RNCM will mail patient [facility] care management brochure/ magnet. RNCM will send closure letter to patients primary MD.   Quinn Plowman RN,BSN,CCM [facility] Telephonic  [phone]

## 2019-07-15 ENCOUNTER — Ambulatory Visit (INDEPENDENT_AMBULATORY_CARE_PROVIDER_SITE_OTHER): Payer: Medicare HMO | Admitting: *Deleted

## 2019-07-15 ENCOUNTER — Ambulatory Visit (INDEPENDENT_AMBULATORY_CARE_PROVIDER_SITE_OTHER): Payer: Medicare HMO | Admitting: Internal Medicine

## 2019-07-15 DIAGNOSIS — Z7901 Long term (current) use of anticoagulants: Secondary | ICD-10-CM | POA: Diagnosis not present

## 2019-07-15 DIAGNOSIS — I480 Paroxysmal atrial fibrillation: Secondary | ICD-10-CM

## 2019-07-15 LAB — CUP PACEART REMOTE DEVICE CHECK
Battery Impedance: 700 Ohm
Battery Remaining Longevity: 84 mo
Battery Voltage: 2.79 V
Brady Statistic RV Percent Paced: 41 %
Date Time Interrogation Session: 20200817165704
Implantable Lead Implant Date: 20071023
Implantable Lead Implant Date: 20071023
Implantable Lead Location: 753859
Implantable Lead Location: 753860
Implantable Lead Model: 4092
Implantable Lead Model: 5594
Implantable Pulse Generator Implant Date: 20140808
Lead Channel Impedance Value: 67 Ohm
Lead Channel Impedance Value: 786 Ohm
Lead Channel Pacing Threshold Amplitude: 0.625 V
Lead Channel Pacing Threshold Pulse Width: 0.4 ms
Lead Channel Setting Pacing Amplitude: 2 V
Lead Channel Setting Pacing Pulse Width: 0.4 ms
Lead Channel Setting Sensing Sensitivity: 4 mV

## 2019-07-15 LAB — POCT INR: INR: 2 (ref 2.0–3.0)

## 2019-07-17 ENCOUNTER — Encounter: Payer: Self-pay | Admitting: Cardiology

## 2019-07-23 NOTE — Progress Notes (Signed)
Remote pacemaker transmission.   

## 2019-07-29 ENCOUNTER — Encounter: Payer: Self-pay | Admitting: Cardiovascular Disease

## 2019-07-29 DIAGNOSIS — Z7901 Long term (current) use of anticoagulants: Secondary | ICD-10-CM | POA: Diagnosis not present

## 2019-07-29 DIAGNOSIS — I4821 Permanent atrial fibrillation: Secondary | ICD-10-CM | POA: Diagnosis not present

## 2019-07-30 DIAGNOSIS — H401131 Primary open-angle glaucoma, bilateral, mild stage: Secondary | ICD-10-CM | POA: Diagnosis not present

## 2019-07-30 DIAGNOSIS — I480 Paroxysmal atrial fibrillation: Secondary | ICD-10-CM | POA: Diagnosis not present

## 2019-07-30 DIAGNOSIS — I10 Essential (primary) hypertension: Secondary | ICD-10-CM | POA: Diagnosis not present

## 2019-07-31 ENCOUNTER — Ambulatory Visit (INDEPENDENT_AMBULATORY_CARE_PROVIDER_SITE_OTHER): Payer: Medicare HMO

## 2019-07-31 DIAGNOSIS — Z7901 Long term (current) use of anticoagulants: Secondary | ICD-10-CM | POA: Diagnosis not present

## 2019-07-31 DIAGNOSIS — I480 Paroxysmal atrial fibrillation: Secondary | ICD-10-CM

## 2019-07-31 LAB — POCT INR: INR: 2.2 (ref 2.0–3.0)

## 2019-08-01 ENCOUNTER — Other Ambulatory Visit: Payer: Self-pay | Admitting: Pharmacist Clinician (PhC)/ Clinical Pharmacy Specialist

## 2019-08-01 MED ORDER — METOPROLOL TARTRATE 25 MG PO TABS: ORAL_TABLET | ORAL | 3 refills | Status: DC

## 2019-08-07 IMAGING — DX DG CHEST 2V
2 series · 2 of 2 positions shown · non-contrast
Comparison: 07/12/2017 chest radiograph.

CLINICAL DATA: Altered mental status

EXAM:
CHEST  2 VIEW

[chest lat]
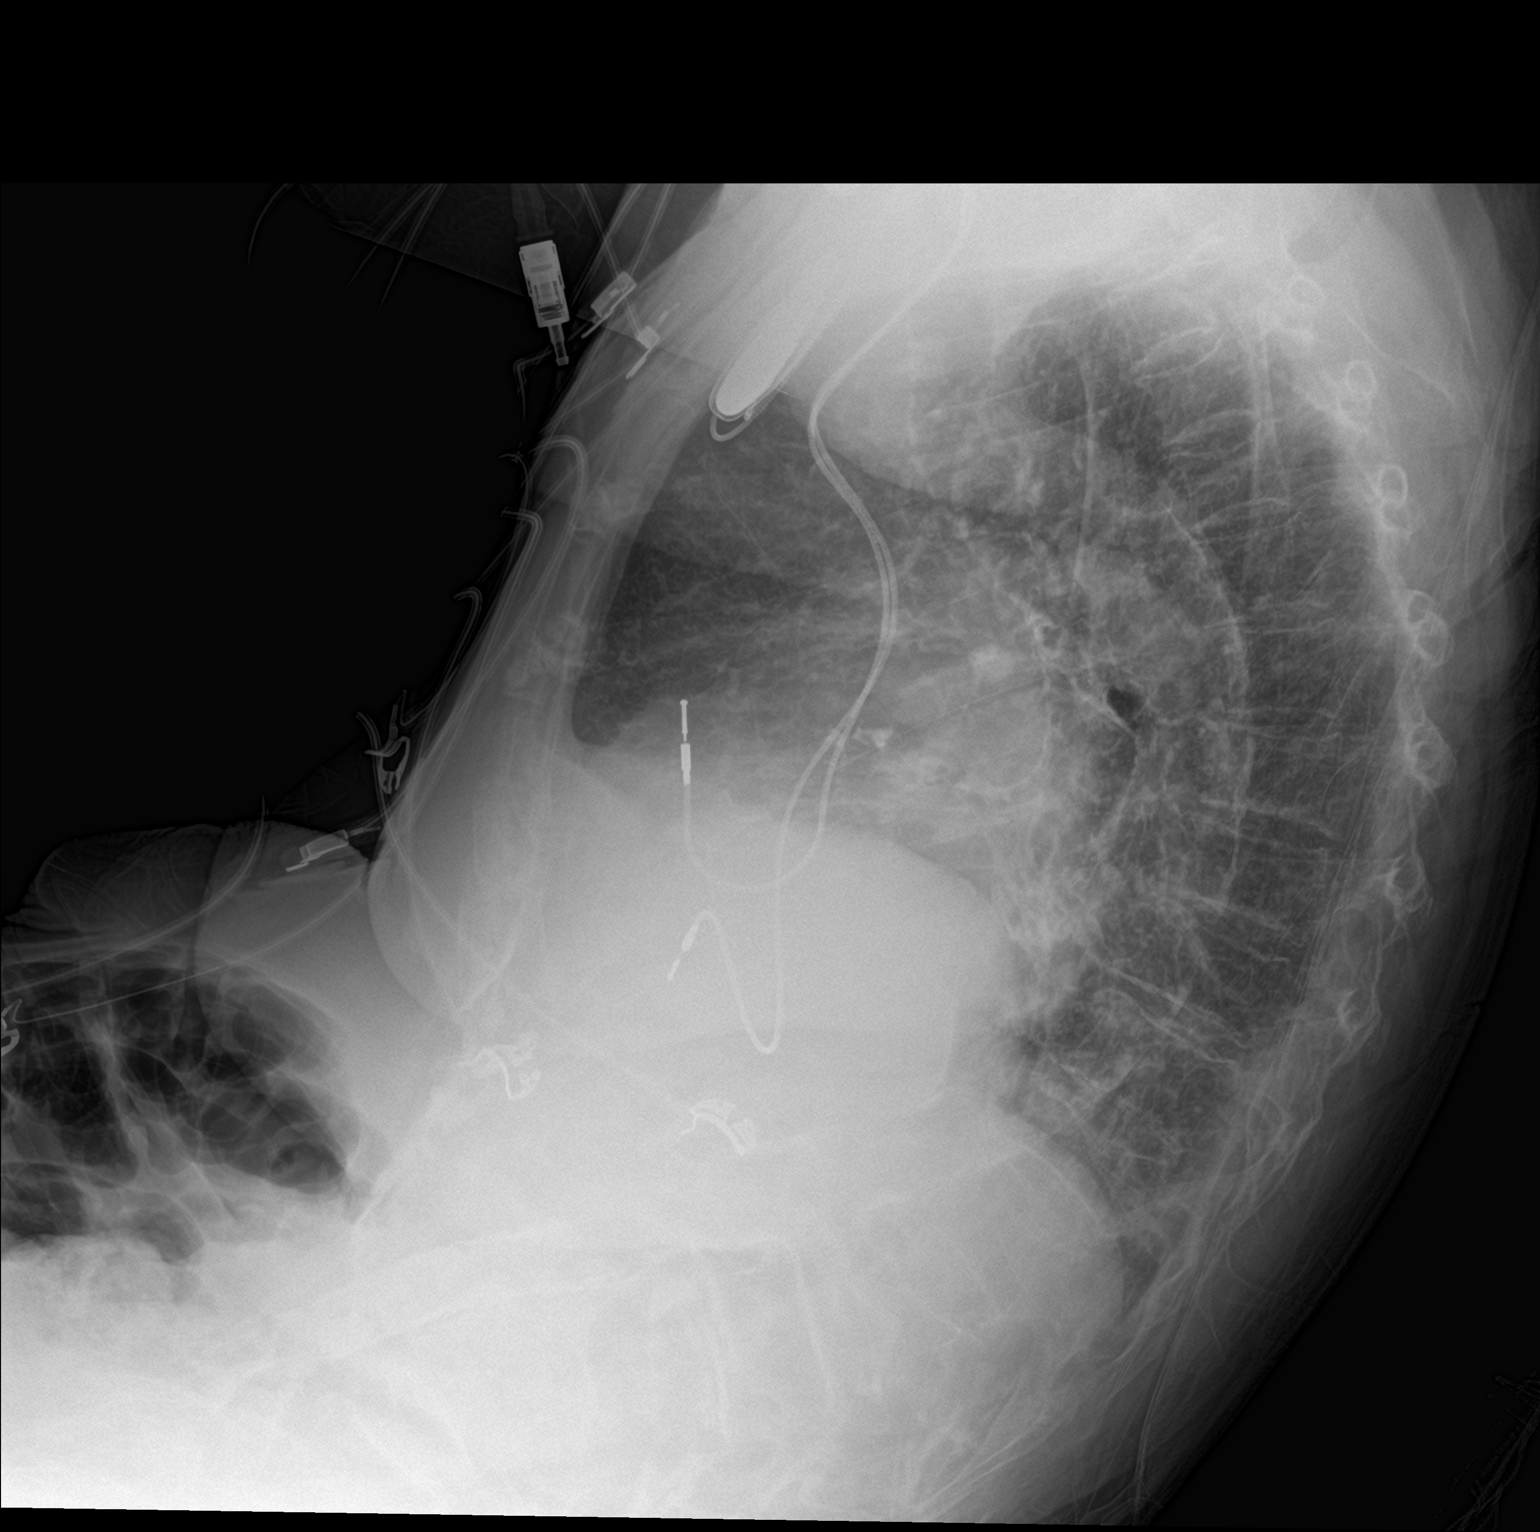

[chest ap]
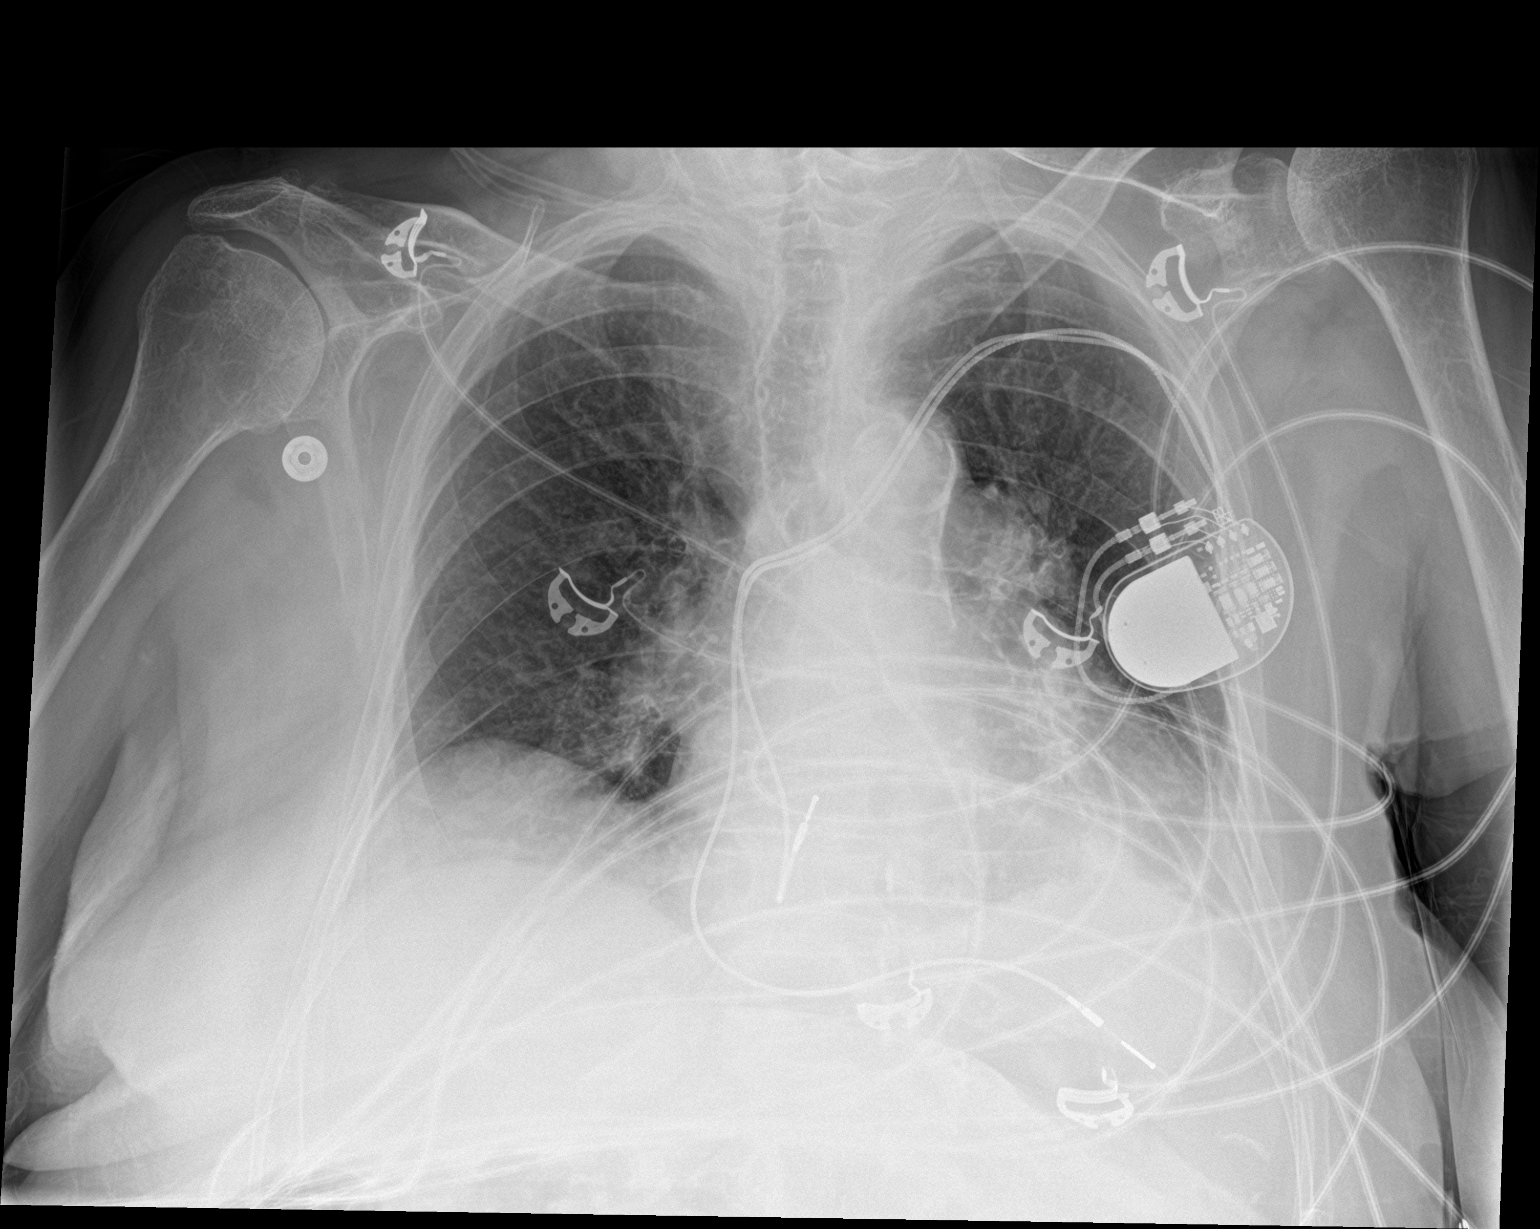

[2 of 2 positions shown; findings below may reference images not displayed]

FINDINGS: Stable configuration of 2 lead left subclavian pacemaker. Stable
cardiomediastinal silhouette with mild cardiomegaly and aortic
atherosclerosis. No pneumothorax. No pleural effusion. Stable mild
scarring versus atelectasis at the left lung base. No overt
pulmonary edema. No acute consolidative airspace disease. Vertebral
compression fractures in the lower thoracic/ upper lumbar spine are
not appreciably changed since 09/09/2016 CT.
IMPRESSION: 1. Stable cardiomegaly without overt pulmonary edema.
2. Stable mild scarring versus atelectasis at the left lung base.

## 2019-08-07 IMAGING — DX DG FOREARM 2V*R*
2 series · 2 of 2 positions shown · non-contrast
Comparison: None.

CLINICAL DATA: Arm caught in mid railing.  Pain.

EXAM:
RIGHT FOREARM - 2 VIEW

[forearm ap]
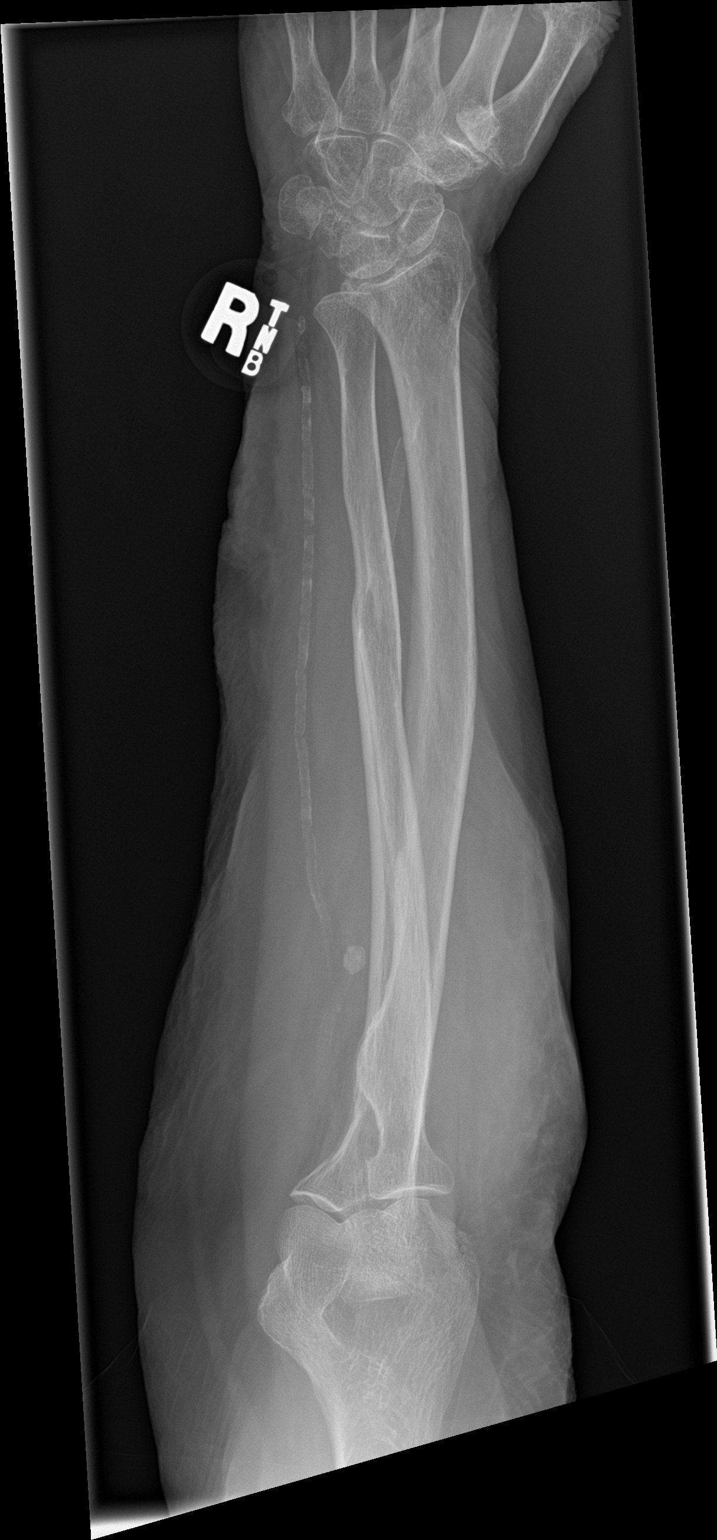

[forearm lat]
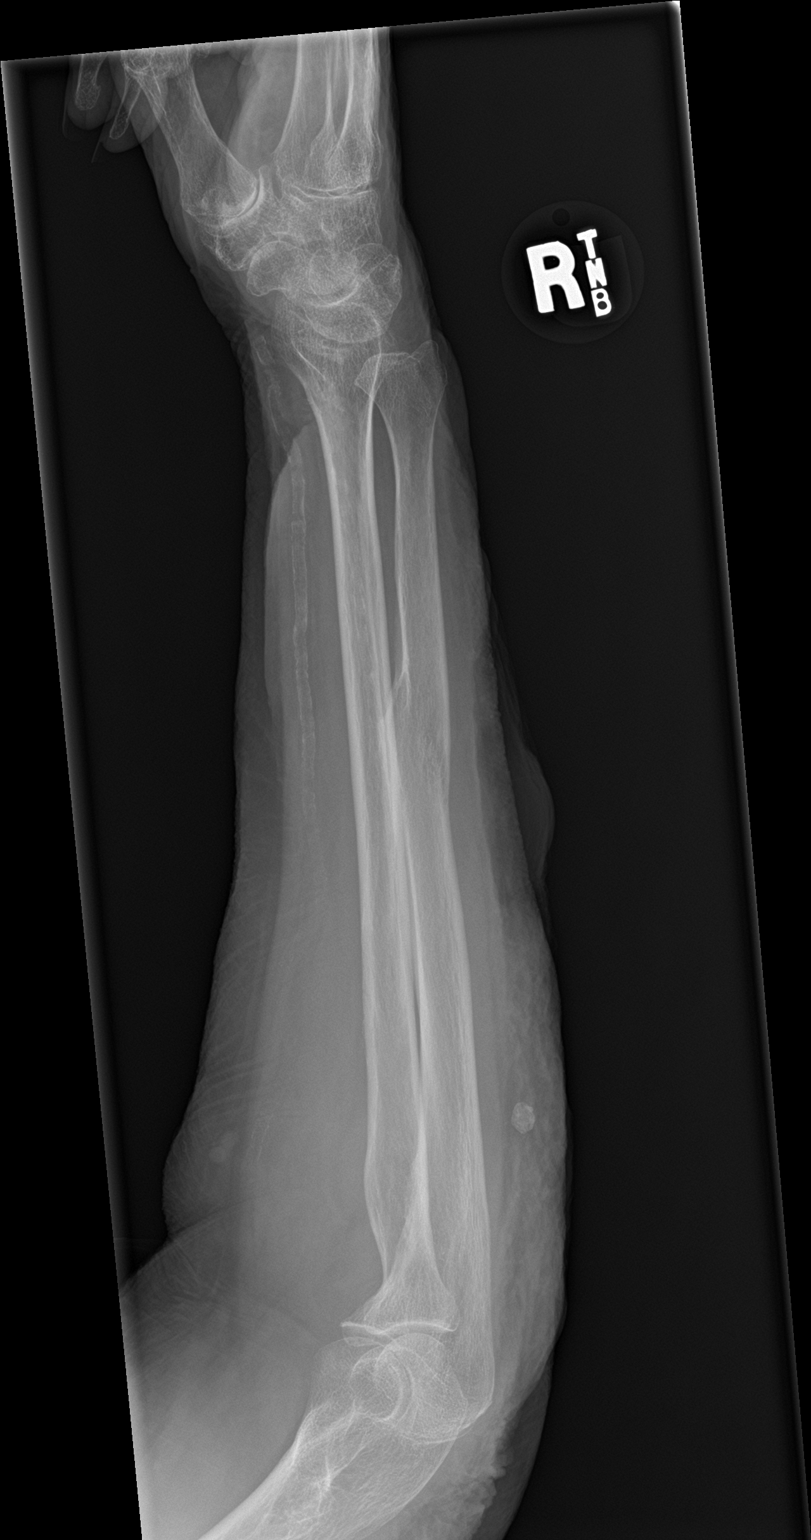

[2 of 2 positions shown; findings below may reference images not displayed]

FINDINGS: Old healed ulnar shaft fracture. No sign of acute injury. Regional
vascular calcification.
IMPRESSION: No acute finding.

## 2019-08-07 IMAGING — DX DG HUMERUS 2V *R*
2 series · 2 of 2 positions shown · non-contrast
Comparison: None.

CLINICAL DATA: All arm call out and bad railing.  Pain.

EXAM:
RIGHT HUMERUS - 2+ VIEW

[humerus ap]
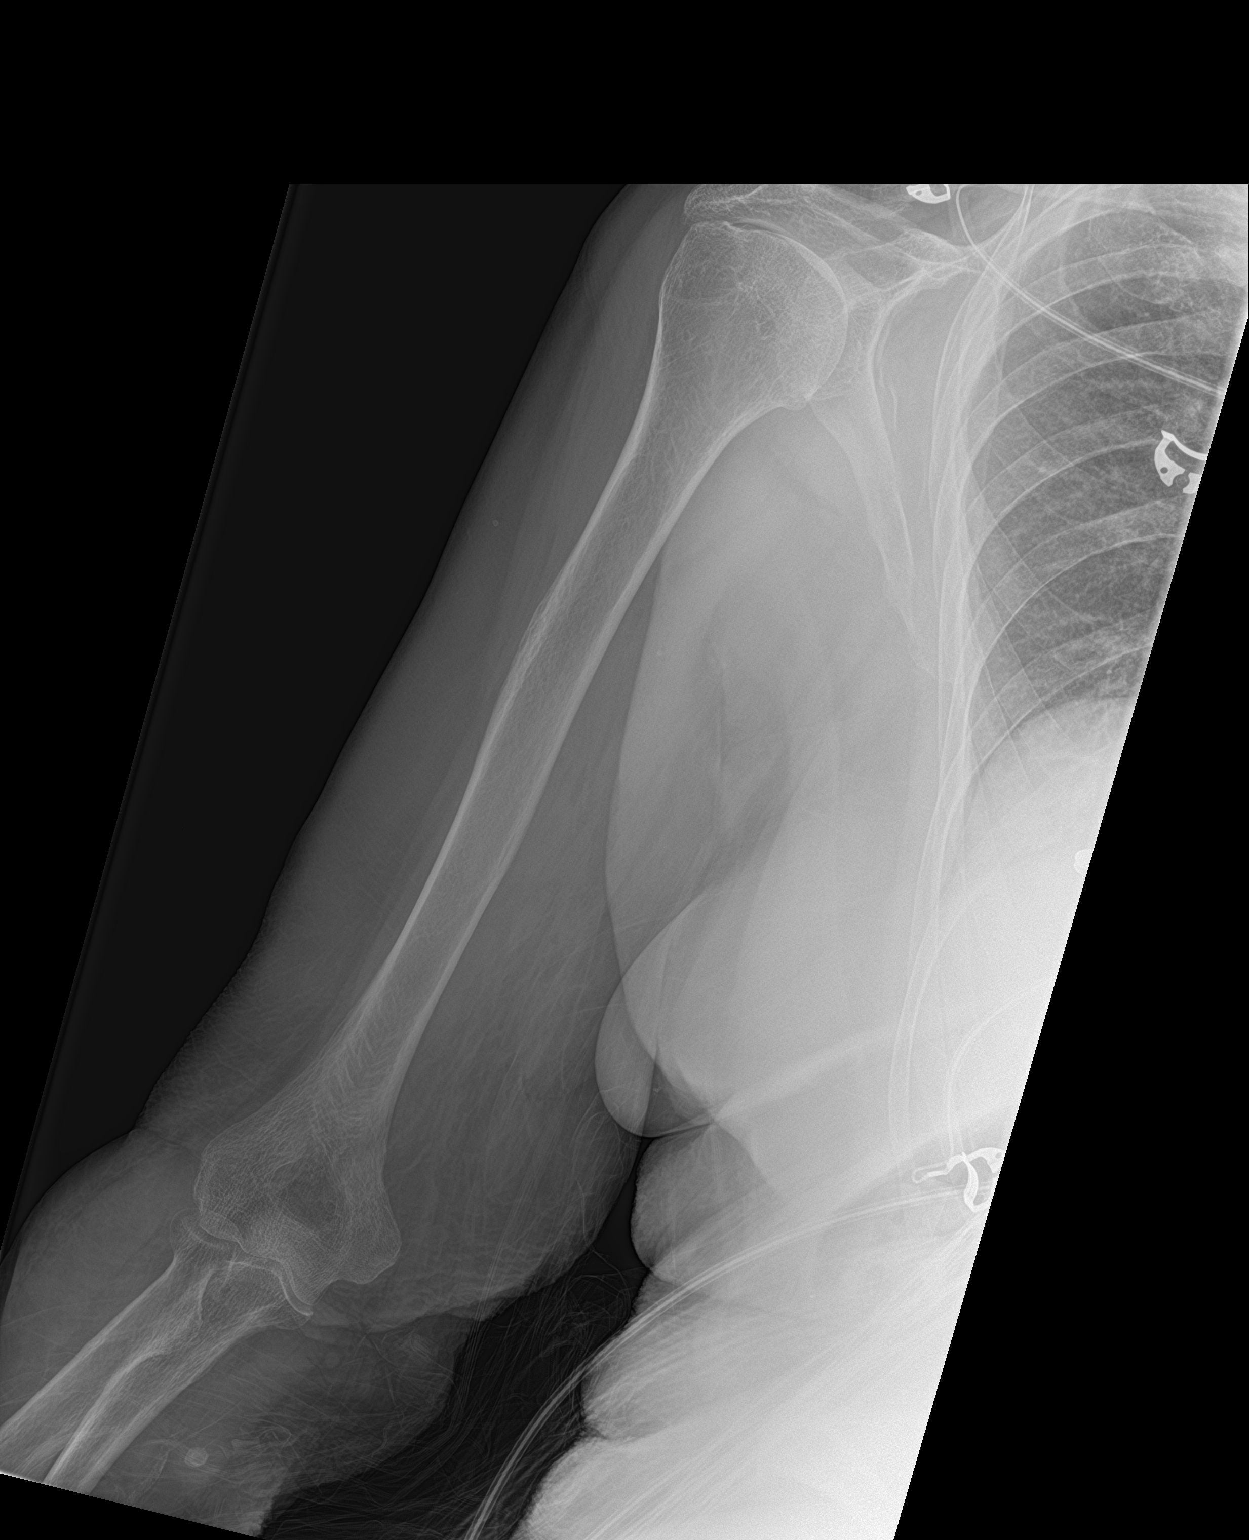

[humerus lat]
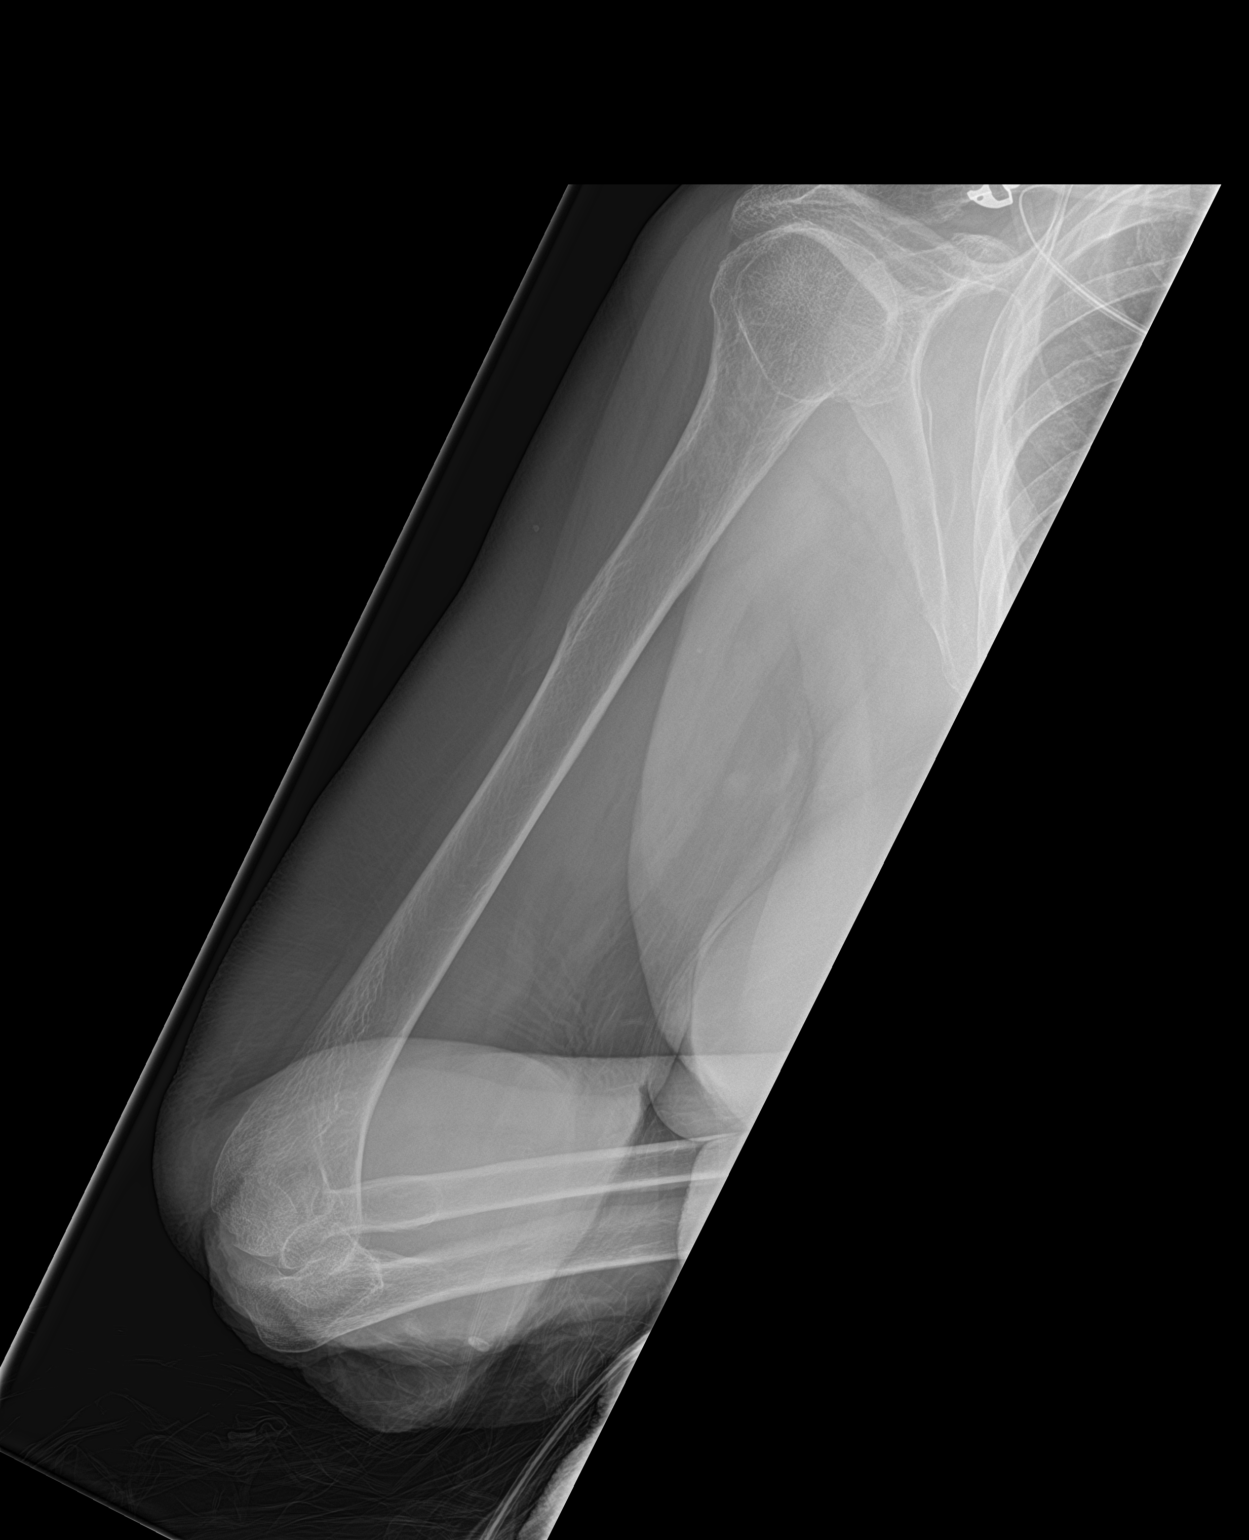

[2 of 2 positions shown; findings below may reference images not displayed]

FINDINGS: There is no evidence of fracture or other focal bone lesions. Soft
tissues are unremarkable.
IMPRESSION: Negative.

## 2019-08-12 LAB — POCT INR: INR: 2.4 (ref 2.0–3.0)

## 2019-08-14 ENCOUNTER — Ambulatory Visit (INDEPENDENT_AMBULATORY_CARE_PROVIDER_SITE_OTHER): Payer: Medicare HMO | Admitting: Pharmacist

## 2019-08-14 DIAGNOSIS — I480 Paroxysmal atrial fibrillation: Secondary | ICD-10-CM

## 2019-08-14 DIAGNOSIS — Z7901 Long term (current) use of anticoagulants: Secondary | ICD-10-CM

## 2019-08-28 ENCOUNTER — Ambulatory Visit (INDEPENDENT_AMBULATORY_CARE_PROVIDER_SITE_OTHER): Payer: Medicare HMO | Admitting: Cardiovascular Disease

## 2019-08-28 DIAGNOSIS — Z7901 Long term (current) use of anticoagulants: Secondary | ICD-10-CM

## 2019-08-28 DIAGNOSIS — I480 Paroxysmal atrial fibrillation: Secondary | ICD-10-CM

## 2019-08-28 LAB — POCT INR: INR: 3 (ref 2.0–3.0)

## 2019-08-29 DIAGNOSIS — I5022 Chronic systolic (congestive) heart failure: Secondary | ICD-10-CM | POA: Diagnosis not present

## 2019-08-29 DIAGNOSIS — I10 Essential (primary) hypertension: Secondary | ICD-10-CM | POA: Diagnosis not present

## 2019-09-02 ENCOUNTER — Telehealth: Payer: Self-pay | Admitting: Pharmacist

## 2019-09-02 NOTE — Telephone Encounter (Signed)
Spoke with patient's daughter about changing pt to [place] for improved efficacy and safety. Pt is currently a Personal assistant, has progressive dementia, daughter manages her medications. She is agreeable to changing to Eliquis. Next INR check is due on 10/14, she will recheck INR then and can change pt to Eliquis at that time. She will qualify for 5mg  BID dosing based on weight > 60kg and SCr < 1.5. She has Medicaid insurance - 3 month supply of Eliquis will cost $8.95.

## 2019-09-11 DIAGNOSIS — I4821 Permanent atrial fibrillation: Secondary | ICD-10-CM | POA: Diagnosis not present

## 2019-09-11 DIAGNOSIS — Z7901 Long term (current) use of anticoagulants: Secondary | ICD-10-CM | POA: Diagnosis not present

## 2019-09-11 LAB — POCT INR: INR: 2.4 (ref 2.0–3.0)

## 2019-09-13 ENCOUNTER — Ambulatory Visit (INDEPENDENT_AMBULATORY_CARE_PROVIDER_SITE_OTHER): Payer: Medicare HMO | Admitting: Pharmacist Clinician (PhC)/ Clinical Pharmacy Specialist

## 2019-09-13 DIAGNOSIS — Z7901 Long term (current) use of anticoagulants: Secondary | ICD-10-CM | POA: Diagnosis not present

## 2019-09-13 DIAGNOSIS — I480 Paroxysmal atrial fibrillation: Secondary | ICD-10-CM | POA: Diagnosis not present

## 2019-09-25 LAB — POCT INR: INR: 2.2 (ref 2.0–3.0)

## 2019-09-27 ENCOUNTER — Ambulatory Visit (INDEPENDENT_AMBULATORY_CARE_PROVIDER_SITE_OTHER): Payer: Medicare HMO | Admitting: Pharmacist Clinician (PhC)/ Clinical Pharmacy Specialist

## 2019-09-27 DIAGNOSIS — Z7901 Long term (current) use of anticoagulants: Secondary | ICD-10-CM | POA: Diagnosis not present

## 2019-09-27 DIAGNOSIS — I480 Paroxysmal atrial fibrillation: Secondary | ICD-10-CM | POA: Diagnosis not present

## 2019-09-29 DIAGNOSIS — I10 Essential (primary) hypertension: Secondary | ICD-10-CM | POA: Diagnosis not present

## 2019-09-29 DIAGNOSIS — G301 Alzheimer's disease with late onset: Secondary | ICD-10-CM | POA: Diagnosis not present

## 2019-09-29 DIAGNOSIS — I480 Paroxysmal atrial fibrillation: Secondary | ICD-10-CM | POA: Diagnosis not present

## 2019-10-11 LAB — POCT INR: INR: 2.6 (ref 2.0–3.0)

## 2019-10-14 ENCOUNTER — Ambulatory Visit (INDEPENDENT_AMBULATORY_CARE_PROVIDER_SITE_OTHER): Payer: Medicare HMO | Admitting: Cardiovascular Disease

## 2019-10-14 ENCOUNTER — Ambulatory Visit (INDEPENDENT_AMBULATORY_CARE_PROVIDER_SITE_OTHER): Payer: Medicare HMO | Admitting: *Deleted

## 2019-10-14 DIAGNOSIS — I480 Paroxysmal atrial fibrillation: Secondary | ICD-10-CM

## 2019-10-14 DIAGNOSIS — Z7901 Long term (current) use of anticoagulants: Secondary | ICD-10-CM

## 2019-10-17 LAB — CUP PACEART REMOTE DEVICE CHECK
Battery Impedance: 802 Ohm
Battery Remaining Longevity: 78 mo
Battery Voltage: 2.79 V
Brady Statistic RV Percent Paced: 41 %
Date Time Interrogation Session: 20201119114645
Implantable Lead Implant Date: 20071023
Implantable Lead Implant Date: 20071023
Implantable Lead Location: 753859
Implantable Lead Location: 753860
Implantable Lead Model: 4092
Implantable Lead Model: 5594
Implantable Pulse Generator Implant Date: 20140808
Lead Channel Impedance Value: 67 Ohm
Lead Channel Impedance Value: 781 Ohm
Lead Channel Pacing Threshold Amplitude: 0.625 V
Lead Channel Pacing Threshold Pulse Width: 0.4 ms
Lead Channel Setting Pacing Amplitude: 2 V
Lead Channel Setting Pacing Pulse Width: 0.4 ms
Lead Channel Setting Sensing Sensitivity: 5.6 mV

## 2019-10-27 LAB — POCT INR: INR: 2.6 (ref 2.0–3.0)

## 2019-10-30 ENCOUNTER — Ambulatory Visit (INDEPENDENT_AMBULATORY_CARE_PROVIDER_SITE_OTHER): Payer: Medicare HMO | Admitting: Cardiovascular Disease

## 2019-10-30 DIAGNOSIS — Z7901 Long term (current) use of anticoagulants: Secondary | ICD-10-CM | POA: Diagnosis not present

## 2019-10-30 DIAGNOSIS — I480 Paroxysmal atrial fibrillation: Secondary | ICD-10-CM

## 2019-11-09 NOTE — Progress Notes (Signed)
Remote pacemaker transmission.   

## 2019-11-11 DIAGNOSIS — Z1389 Encounter for screening for other disorder: Secondary | ICD-10-CM | POA: Diagnosis not present

## 2019-11-11 DIAGNOSIS — Z1331 Encounter for screening for depression: Secondary | ICD-10-CM | POA: Diagnosis not present

## 2019-11-11 DIAGNOSIS — I5022 Chronic systolic (congestive) heart failure: Secondary | ICD-10-CM | POA: Diagnosis not present

## 2019-11-11 DIAGNOSIS — G301 Alzheimer's disease with late onset: Secondary | ICD-10-CM | POA: Diagnosis not present

## 2019-11-11 DIAGNOSIS — Z0001 Encounter for general adult medical examination with abnormal findings: Secondary | ICD-10-CM | POA: Diagnosis not present

## 2019-11-11 DIAGNOSIS — I4821 Permanent atrial fibrillation: Secondary | ICD-10-CM | POA: Diagnosis not present

## 2019-11-11 DIAGNOSIS — Z7901 Long term (current) use of anticoagulants: Secondary | ICD-10-CM | POA: Diagnosis not present

## 2019-11-11 DIAGNOSIS — I480 Paroxysmal atrial fibrillation: Secondary | ICD-10-CM | POA: Diagnosis not present

## 2019-11-11 LAB — POCT INR: INR: 2.2 (ref 2.0–3.0)

## 2019-11-12 ENCOUNTER — Ambulatory Visit (INDEPENDENT_AMBULATORY_CARE_PROVIDER_SITE_OTHER): Payer: Medicare HMO | Admitting: Pharmacist

## 2019-11-12 DIAGNOSIS — Z7901 Long term (current) use of anticoagulants: Secondary | ICD-10-CM | POA: Diagnosis not present

## 2019-11-12 DIAGNOSIS — I480 Paroxysmal atrial fibrillation: Secondary | ICD-10-CM | POA: Diagnosis not present

## 2019-11-19 ENCOUNTER — Encounter: Payer: Self-pay | Admitting: Urology

## 2019-11-25 LAB — POCT INR: INR: 2.5 (ref 2.0–3.0)

## 2019-11-28 ENCOUNTER — Ambulatory Visit (INDEPENDENT_AMBULATORY_CARE_PROVIDER_SITE_OTHER): Payer: Medicare HMO | Admitting: Pharmacist

## 2019-11-28 DIAGNOSIS — I480 Paroxysmal atrial fibrillation: Secondary | ICD-10-CM

## 2019-11-28 DIAGNOSIS — Z7901 Long term (current) use of anticoagulants: Secondary | ICD-10-CM | POA: Diagnosis not present

## 2019-12-05 ENCOUNTER — Other Ambulatory Visit: Payer: Self-pay | Admitting: Cardiovascular Disease

## 2019-12-05 DIAGNOSIS — I5022 Chronic systolic (congestive) heart failure: Secondary | ICD-10-CM | POA: Diagnosis not present

## 2019-12-05 DIAGNOSIS — I10 Essential (primary) hypertension: Secondary | ICD-10-CM | POA: Diagnosis not present

## 2019-12-05 DIAGNOSIS — I480 Paroxysmal atrial fibrillation: Secondary | ICD-10-CM | POA: Diagnosis not present

## 2019-12-05 DIAGNOSIS — Z0001 Encounter for general adult medical examination with abnormal findings: Secondary | ICD-10-CM | POA: Diagnosis not present

## 2019-12-11 ENCOUNTER — Ambulatory Visit (INDEPENDENT_AMBULATORY_CARE_PROVIDER_SITE_OTHER): Payer: Medicare HMO | Admitting: Cardiology

## 2019-12-11 DIAGNOSIS — I480 Paroxysmal atrial fibrillation: Secondary | ICD-10-CM | POA: Diagnosis not present

## 2019-12-11 DIAGNOSIS — Z7901 Long term (current) use of anticoagulants: Secondary | ICD-10-CM | POA: Diagnosis not present

## 2019-12-11 LAB — POCT INR: INR: 2.2 (ref 2.0–3.0)

## 2019-12-12 DIAGNOSIS — I5022 Chronic systolic (congestive) heart failure: Secondary | ICD-10-CM | POA: Diagnosis not present

## 2019-12-12 DIAGNOSIS — G301 Alzheimer's disease with late onset: Secondary | ICD-10-CM | POA: Diagnosis not present

## 2019-12-23 ENCOUNTER — Encounter (HOSPITAL_COMMUNITY): Payer: Self-pay

## 2019-12-23 ENCOUNTER — Emergency Department (HOSPITAL_COMMUNITY): Payer: Medicare HMO

## 2019-12-23 ENCOUNTER — Ambulatory Visit: Payer: Medicare HMO | Attending: Internal Medicine

## 2019-12-23 ENCOUNTER — Other Ambulatory Visit: Payer: Self-pay

## 2019-12-23 ENCOUNTER — Inpatient Hospital Stay (HOSPITAL_COMMUNITY)
Admission: EM | Admit: 2019-12-23 | Discharge: 2020-01-02 | DRG: 177 | Disposition: A | Discharge status: Home Health | Payer: Medicare HMO | Attending: Family Medicine | Admitting: Family Medicine

## 2019-12-23 DIAGNOSIS — Z7901 Long term (current) use of anticoagulants: Secondary | ICD-10-CM

## 2019-12-23 DIAGNOSIS — Z7401 Bed confinement status: Secondary | ICD-10-CM | POA: Diagnosis not present

## 2019-12-23 DIAGNOSIS — R4189 Other symptoms and signs involving cognitive functions and awareness: Secondary | ICD-10-CM | POA: Diagnosis present

## 2019-12-23 DIAGNOSIS — G9341 Metabolic encephalopathy: Secondary | ICD-10-CM | POA: Diagnosis not present

## 2019-12-23 DIAGNOSIS — J9601 Acute respiratory failure with hypoxia: Secondary | ICD-10-CM | POA: Diagnosis not present

## 2019-12-23 DIAGNOSIS — Z950 Presence of cardiac pacemaker: Secondary | ICD-10-CM | POA: Diagnosis not present

## 2019-12-23 DIAGNOSIS — I10 Essential (primary) hypertension: Secondary | ICD-10-CM | POA: Diagnosis present

## 2019-12-23 DIAGNOSIS — E785 Hyperlipidemia, unspecified: Secondary | ICD-10-CM | POA: Diagnosis not present

## 2019-12-23 DIAGNOSIS — E1165 Type 2 diabetes mellitus with hyperglycemia: Secondary | ICD-10-CM | POA: Diagnosis not present

## 2019-12-23 DIAGNOSIS — I482 Chronic atrial fibrillation, unspecified: Secondary | ICD-10-CM | POA: Diagnosis present

## 2019-12-23 DIAGNOSIS — Z91041 Radiographic dye allergy status: Secondary | ICD-10-CM

## 2019-12-23 DIAGNOSIS — J1282 Pneumonia due to coronavirus disease 2019: Secondary | ICD-10-CM | POA: Diagnosis not present

## 2019-12-23 DIAGNOSIS — N3000 Acute cystitis without hematuria: Secondary | ICD-10-CM | POA: Diagnosis not present

## 2019-12-23 DIAGNOSIS — Z880 Allergy status to penicillin: Secondary | ICD-10-CM | POA: Diagnosis not present

## 2019-12-23 DIAGNOSIS — G9340 Encephalopathy, unspecified: Secondary | ICD-10-CM

## 2019-12-23 DIAGNOSIS — I959 Hypotension, unspecified: Secondary | ICD-10-CM | POA: Diagnosis not present

## 2019-12-23 DIAGNOSIS — I4891 Unspecified atrial fibrillation: Secondary | ICD-10-CM | POA: Diagnosis present

## 2019-12-23 DIAGNOSIS — J069 Acute upper respiratory infection, unspecified: Secondary | ICD-10-CM | POA: Diagnosis not present

## 2019-12-23 DIAGNOSIS — Z882 Allergy status to sulfonamides status: Secondary | ICD-10-CM | POA: Diagnosis not present

## 2019-12-23 DIAGNOSIS — Z66 Do not resuscitate: Secondary | ICD-10-CM | POA: Diagnosis not present

## 2019-12-23 DIAGNOSIS — U071 COVID-19: Secondary | ICD-10-CM | POA: Diagnosis not present

## 2019-12-23 DIAGNOSIS — E875 Hyperkalemia: Secondary | ICD-10-CM | POA: Diagnosis not present

## 2019-12-23 DIAGNOSIS — N390 Urinary tract infection, site not specified: Secondary | ICD-10-CM | POA: Diagnosis not present

## 2019-12-23 DIAGNOSIS — R531 Weakness: Secondary | ICD-10-CM | POA: Diagnosis not present

## 2019-12-23 DIAGNOSIS — R5381 Other malaise: Secondary | ICD-10-CM | POA: Diagnosis not present

## 2019-12-23 DIAGNOSIS — R4182 Altered mental status, unspecified: Secondary | ICD-10-CM | POA: Diagnosis not present

## 2019-12-23 DIAGNOSIS — F0390 Unspecified dementia without behavioral disturbance: Secondary | ICD-10-CM | POA: Diagnosis not present

## 2019-12-23 DIAGNOSIS — R404 Transient alteration of awareness: Secondary | ICD-10-CM | POA: Diagnosis not present

## 2019-12-23 DIAGNOSIS — R05 Cough: Secondary | ICD-10-CM | POA: Diagnosis not present

## 2019-12-23 LAB — COMPREHENSIVE METABOLIC PANEL
ALT: 18 U/L (ref 0–44)
AST: 41 U/L (ref 15–41)
Albumin: 3.2 g/dL — ABNORMAL LOW (ref 3.5–5.0)
Alkaline Phosphatase: 60 U/L (ref 38–126)
Anion gap: 7 (ref 5–15)
BUN: 29 mg/dL — ABNORMAL HIGH (ref 8–23)
CO2: 24 mmol/L (ref 22–32)
Calcium: 8 mg/dL — ABNORMAL LOW (ref 8.9–10.3)
Chloride: 104 mmol/L (ref 98–111)
Creatinine, Ser: 1.16 mg/dL — ABNORMAL HIGH (ref 0.44–1.00)
GFR calc Af Amer: 48 mL/min — ABNORMAL LOW (ref >60)
GFR calc non Af Amer: 41 mL/min — ABNORMAL LOW (ref >60)
Glucose, Bld: 151 mg/dL — ABNORMAL HIGH (ref 70–99)
Potassium: 4.5 mmol/L (ref 3.5–5.1)
Sodium: 135 mmol/L (ref 135–145)
Total Bilirubin: 0.5 mg/dL (ref 0.3–1.2)
Total Protein: 6.6 g/dL (ref 6.5–8.1)

## 2019-12-23 LAB — POC SARS CORONAVIRUS 2 AG -  ED: SARS Coronavirus 2 Ag: POSITIVE — AB

## 2019-12-23 LAB — URINALYSIS, ROUTINE W REFLEX MICROSCOPIC
Bilirubin Urine: NEGATIVE
Glucose, UA: NEGATIVE mg/dL
Ketones, ur: NEGATIVE mg/dL
Nitrite: POSITIVE — AB
Protein, ur: 30 mg/dL — AB
Specific Gravity, Urine: 1.013 (ref 1.005–1.030)
WBC, UA: >50 WBC/hpf — ABNORMAL HIGH (ref 0–5)
pH: 6 (ref 5.0–8.0)

## 2019-12-23 LAB — GLUCOSE, CAPILLARY: Glucose-Capillary: 126 mg/dL — ABNORMAL HIGH (ref 70–99)

## 2019-12-23 LAB — CBC WITH DIFFERENTIAL/PLATELET
Abs Immature Granulocytes: 0.01 10*3/uL (ref 0.00–0.07)
Basophils Absolute: 0 10*3/uL (ref 0.0–0.1)
Basophils Relative: 0 %
Eosinophils Absolute: 0 10*3/uL (ref 0.0–0.5)
Eosinophils Relative: 0 %
HCT: 42.2 % (ref 36.0–46.0)
Hemoglobin: 12.8 g/dL (ref 12.0–15.0)
Immature Granulocytes: 0 %
Lymphocytes Relative: 21 %
Lymphs Abs: 0.8 10*3/uL (ref 0.7–4.0)
MCH: 29.8 pg (ref 26.0–34.0)
MCHC: 30.3 g/dL (ref 30.0–36.0)
MCV: 98.4 fL (ref 80.0–100.0)
Monocytes Absolute: 0.3 10*3/uL (ref 0.1–1.0)
Monocytes Relative: 9 %
Neutro Abs: 2.5 10*3/uL (ref 1.7–7.7)
Neutrophils Relative %: 70 %
Platelets: 117 10*3/uL — ABNORMAL LOW (ref 150–400)
RBC: 4.29 MIL/uL (ref 3.87–5.11)
RDW: 14.7 % (ref 11.5–15.5)
WBC: 3.6 10*3/uL — ABNORMAL LOW (ref 4.0–10.5)
nRBC: 0 % (ref 0.0–0.2)

## 2019-12-23 LAB — LACTIC ACID, PLASMA
Lactic Acid, Venous: 2 mmol/L (ref 0.5–1.9)
Lactic Acid, Venous: 1.3 mmol/L (ref 0.5–1.9)

## 2019-12-23 LAB — PROTIME-INR
INR: 1.7 — ABNORMAL HIGH (ref 0.8–1.2)
Prothrombin Time: 19.9 seconds — ABNORMAL HIGH (ref 11.4–15.2)

## 2019-12-23 LAB — LACTATE DEHYDROGENASE: LDH: 168 U/L (ref 98–192)

## 2019-12-23 LAB — TRIGLYCERIDES: Triglycerides: 94 mg/dL (ref <150)

## 2019-12-23 LAB — FIBRINOGEN: Fibrinogen: 418 mg/dL (ref 210–475)

## 2019-12-23 LAB — ABO/RH: ABO/RH(D): A POS

## 2019-12-23 LAB — C-REACTIVE PROTEIN: CRP: 2.2 mg/dL — ABNORMAL HIGH (ref <1.0)

## 2019-12-23 LAB — FERRITIN: Ferritin: 199 ng/mL (ref 11–307)

## 2019-12-23 LAB — D-DIMER, QUANTITATIVE: D-Dimer, Quant: 2.41 ug/mL-FEU — ABNORMAL HIGH (ref 0.00–0.50)

## 2019-12-23 LAB — PROCALCITONIN: Procalcitonin: <0.1 ng/mL

## 2019-12-23 MED ORDER — ACETAMINOPHEN 650 MG RE SUPP: 650.0000 mg | Freq: Four times a day (QID) | RECTAL | Status: DC | PRN

## 2019-12-23 MED ORDER — METOPROLOL TARTRATE 25 MG PO TABS
12.5000 mg | ORAL_TABLET | Freq: Every day | ORAL | Status: DC
  Administered 2019-12-24 – 2020-01-01 (×9): 12.5 mg via ORAL
  Filled 2019-12-23 (×10): qty 1

## 2019-12-23 MED ORDER — SODIUM CHLORIDE 0.9 % IV SOLN
250.0000 mL | INTRAVENOUS | Status: DC | PRN
  Administered 2019-12-23 – 2019-12-24 (×2): 250 mL via INTRAVENOUS

## 2019-12-23 MED ORDER — WARFARIN SODIUM 1 MG PO TABS: 1.5000 mg | ORAL_TABLET | Freq: Every day | ORAL | Status: DC

## 2019-12-23 MED ORDER — SODIUM CHLORIDE 0.9 % IV SOLN: 1.0000 g | INTRAVENOUS | Status: DC

## 2019-12-23 MED ORDER — ONDANSETRON HCL 4 MG PO TABS: 4.0000 mg | ORAL_TABLET | Freq: Four times a day (QID) | ORAL | Status: DC | PRN

## 2019-12-23 MED ORDER — ZINC SULFATE 220 (50 ZN) MG PO CAPS
220.0000 mg | ORAL_CAPSULE | Freq: Every day | ORAL | Status: DC
  Administered 2019-12-24 – 2020-01-02 (×10): 220 mg via ORAL
  Filled 2019-12-23 (×10): qty 1

## 2019-12-23 MED ORDER — GUAIFENESIN 100 MG/5ML PO SYRP: 200.0000 mg | ORAL_SOLUTION | Freq: Four times a day (QID) | ORAL | Status: DC | PRN

## 2019-12-23 MED ORDER — ALBUTEROL SULFATE HFA 108 (90 BASE) MCG/ACT IN AERS
2.0000 | INHALATION_SPRAY | Freq: Four times a day (QID) | RESPIRATORY_TRACT | Status: DC
  Administered 2019-12-23: 20:00:00 2 via RESPIRATORY_TRACT
  Filled 2019-12-23: qty 6.7

## 2019-12-23 MED ORDER — DEXAMETHASONE SODIUM PHOSPHATE 10 MG/ML IJ SOLN
6.0000 mg | INTRAMUSCULAR | Status: DC
  Administered 2019-12-23 – 2019-12-30 (×8): 6 mg via INTRAVENOUS
  Filled 2019-12-23 (×8): qty 1

## 2019-12-23 MED ORDER — ACETAMINOPHEN 500 MG PO TABS
1000.0000 mg | ORAL_TABLET | Freq: Once | ORAL | Status: AC
  Administered 2019-12-23: 1000 mg via ORAL
  Filled 2019-12-23: qty 2

## 2019-12-23 MED ORDER — LATANOPROST 0.005 % OP SOLN
1.0000 [drp] | Freq: Every day | OPHTHALMIC | Status: DC
  Administered 2019-12-23 – 2020-01-01 (×10): 1 [drp] via OPHTHALMIC
  Filled 2019-12-23: qty 2.5

## 2019-12-23 MED ORDER — SIMVASTATIN 20 MG PO TABS
10.0000 mg | ORAL_TABLET | Freq: Every day | ORAL | Status: DC
  Administered 2019-12-24 – 2020-01-01 (×9): 10 mg via ORAL
  Filled 2019-12-23 (×10): qty 1

## 2019-12-23 MED ORDER — SODIUM CHLORIDE 0.9 % IV SOLN
1.0000 g | Freq: Once | INTRAVENOUS | Status: AC
  Administered 2019-12-23: 1 g via INTRAVENOUS
  Filled 2019-12-23: qty 10

## 2019-12-23 MED ORDER — WARFARIN - PHARMACIST DOSING INPATIENT: Freq: Every day | Status: DC

## 2019-12-23 MED ORDER — HYDROCOD POLST-CPM POLST ER 10-8 MG/5ML PO SUER
5.0000 mL | Freq: Two times a day (BID) | ORAL | Status: DC | PRN
  Administered 2019-12-27 – 2019-12-31 (×4): 5 mL via ORAL
  Filled 2019-12-23 (×5): qty 5

## 2019-12-23 MED ORDER — SODIUM CHLORIDE 0.9 % IV SOLN
100.0000 mg | Freq: Every day | INTRAVENOUS | Status: AC
  Administered 2019-12-24 – 2019-12-27 (×4): 100 mg via INTRAVENOUS
  Filled 2019-12-23 (×5): qty 20

## 2019-12-23 MED ORDER — DEXTROSE-NACL 5-0.45 % IV SOLN: INTRAVENOUS | Status: DC

## 2019-12-23 MED ORDER — ONDANSETRON HCL 4 MG/2ML IJ SOLN: 4.0000 mg | Freq: Four times a day (QID) | INTRAMUSCULAR | Status: DC | PRN

## 2019-12-23 MED ORDER — GUAIFENESIN-DM 100-10 MG/5ML PO SYRP: 10.0000 mL | ORAL_SOLUTION | ORAL | Status: DC | PRN

## 2019-12-23 MED ORDER — SODIUM CHLORIDE 0.9% FLUSH
3.0000 mL | INTRAVENOUS | Status: DC | PRN
  Administered 2019-12-25: 3 mL via INTRAVENOUS

## 2019-12-23 MED ORDER — WARFARIN SODIUM 1 MG PO TABS: 3.0000 mg | ORAL_TABLET | Freq: Once | ORAL | Status: DC

## 2019-12-23 MED ORDER — PANTOPRAZOLE SODIUM 40 MG PO TBEC
40.0000 mg | DELAYED_RELEASE_TABLET | Freq: Every day | ORAL | Status: DC
  Administered 2019-12-24 – 2020-01-02 (×9): 40 mg via ORAL
  Filled 2019-12-23 (×10): qty 1

## 2019-12-23 MED ORDER — ASCORBIC ACID 500 MG PO TABS
500.0000 mg | ORAL_TABLET | Freq: Every day | ORAL | Status: DC
  Administered 2019-12-24 – 2020-01-02 (×10): 500 mg via ORAL
  Filled 2019-12-23 (×10): qty 1

## 2019-12-23 MED ORDER — FOLIC ACID 1 MG PO TABS
1.0000 mg | ORAL_TABLET | Freq: Every day | ORAL | Status: DC
  Administered 2019-12-24 – 2020-01-02 (×10): 1 mg via ORAL
  Filled 2019-12-23 (×10): qty 1

## 2019-12-23 MED ORDER — POLYETHYLENE GLYCOL 3350 17 G PO PACK: 17.0000 g | PACK | Freq: Every day | ORAL | Status: DC | PRN

## 2019-12-23 MED ORDER — SODIUM CHLORIDE 0.9 % IV SOLN
1.0000 g | Freq: Two times a day (BID) | INTRAVENOUS | Status: DC
  Administered 2019-12-23 – 2019-12-26 (×6): 1 g via INTRAVENOUS
  Filled 2019-12-23 (×7): qty 1

## 2019-12-23 MED ORDER — SODIUM CHLORIDE 0.9 % IV SOLN
200.0000 mg | Freq: Once | INTRAVENOUS | Status: AC
  Administered 2019-12-23: 200 mg via INTRAVENOUS
  Filled 2019-12-23: qty 40

## 2019-12-23 MED ORDER — ADULT MULTIVITAMIN W/MINERALS CH
1.0000 | ORAL_TABLET | Freq: Every day | ORAL | Status: DC
  Administered 2019-12-24 – 2020-01-02 (×10): 1 via ORAL
  Filled 2019-12-23 (×10): qty 1

## 2019-12-23 MED ORDER — THIAMINE HCL 100 MG PO TABS
100.0000 mg | ORAL_TABLET | Freq: Every day | ORAL | Status: DC
  Administered 2019-12-24 – 2020-01-02 (×10): 100 mg via ORAL
  Filled 2019-12-23 (×10): qty 1

## 2019-12-23 MED ORDER — TRAZODONE HCL 50 MG PO TABS: 50.0000 mg | ORAL_TABLET | Freq: Every evening | ORAL | Status: DC | PRN

## 2019-12-23 MED ORDER — GUAIFENESIN ER 600 MG PO TB12
600.0000 mg | ORAL_TABLET | Freq: Two times a day (BID) | ORAL | Status: DC
  Administered 2019-12-24 – 2020-01-02 (×19): 600 mg via ORAL
  Filled 2019-12-23 (×20): qty 1

## 2019-12-23 MED ORDER — QUETIAPINE FUMARATE 25 MG PO TABS
25.0000 mg | ORAL_TABLET | Freq: Every day | ORAL | Status: DC
  Administered 2019-12-24 – 2020-01-01 (×9): 25 mg via ORAL
  Filled 2019-12-23 (×10): qty 1

## 2019-12-23 MED ORDER — ALBUTEROL SULFATE HFA 108 (90 BASE) MCG/ACT IN AERS
2.0000 | INHALATION_SPRAY | Freq: Three times a day (TID) | RESPIRATORY_TRACT | Status: DC
  Administered 2019-12-24 – 2019-12-28 (×13): 2 via RESPIRATORY_TRACT

## 2019-12-23 MED ORDER — ACETAMINOPHEN 325 MG PO TABS: 650.0000 mg | ORAL_TABLET | Freq: Four times a day (QID) | ORAL | Status: DC | PRN

## 2019-12-23 MED ORDER — SODIUM CHLORIDE 0.9% FLUSH
3.0000 mL | Freq: Two times a day (BID) | INTRAVENOUS | Status: DC
  Administered 2019-12-23 – 2020-01-01 (×13): 3 mL via INTRAVENOUS

## 2019-12-23 MED ORDER — METOPROLOL TARTRATE 25 MG PO TABS
25.0000 mg | ORAL_TABLET | Freq: Every day | ORAL | Status: DC
  Administered 2019-12-24 – 2020-01-02 (×9): 25 mg via ORAL
  Filled 2019-12-23 (×10): qty 1

## 2019-12-23 NOTE — Plan of Care (Signed)

## 2019-12-23 NOTE — H&P (Signed)
Patient Demographics:    [name], is a 86 y.o. female  MRN: [id number]   DOB - 1927-10-31  Admit Date - 12/23/2019  Outpatient Primary MD for the patient is Avon Gully, MD   Assessment & Plan:    Principal Problem:   Acute respiratory disease due to COVID-19 virus Active Problems:   Atrial fibrillation [facility])   Pacemaker - Medtronic 2007, new generator 2014   Long term current use of anticoagulant therapy   Dementia without behavioral disturbance (HCC)   Acute lower UTI--- H/o E coli ESBL    1)Acute hypoxic respiratory failure secondary to COVID-19 infection/pneumonia-- --Patient is positive for COVID-19 infection, chest x-ray with findings of infiltrates/opacities,  patient is hypoxic and requiring continuous supplemental oxygen---patient meets criteria for initiation of Remdesivir AND Decadron/Steroid therapy per protocol  --Check and trend fibrinogen, CRP, pro calcitonin, CBC, BMP, d-dimer, LDH, ferritin and LFTs --Supplemental oxygen to keep O2 sats above 93% -Follow serial chest x-rays and ABGs as indicated --- Encourage prone positioning for More than 16 hours/day in increments of 2 to 3 hours at a time if able to tolerate --Attempt to maintain euvolemic state --Zinc and vitamin C as ordered -Albuterol inhaler as needed  2)Presumed UTI--- H/o Prior ESBL E. coli UTI--treat empirically with IV Meropenem pending culture results  3) chronic atrial fibrillation/S/p PPM-- coumadin and metoprolol as ordered  4) social/ethics--EDP discussed with family patient is a DNR  With History of - Reviewed by me  Past Medical History:  Diagnosis Date  . Acid reflux   . Atrial fibrillation (HCC)   . Atrophic vaginitis   . Chronic cystitis   . Decubitus ulcer of buttock   . Dementia (HCC)   .  Hyperlipidemia   . Hypertension   . Incomplete bladder emptying   . Microscopic hematuria   . Pacemaker   . Straining on urination   . Symptomatic bradycardia   . Urge incontinence of urine   . UTI (lower urinary tract infection)       Past Surgical History:  Procedure Laterality Date  . CARDIAC CATHETERIZATION  04/2004  . PACEMAKER GENERATOR CHANGE N/A 07/05/2013   Procedure: PACEMAKER GENERATOR CHANGE;  Surgeon: Thurmon Fair, MD;  Location: [facility] CATH LAB;  Service: Cardiovascular;  Laterality: N/A;  . PACEMAKER INSERTION       Chief Complaint  Patient presents with  . Weakness  . Cough      HPI:    [name]  is a 86 y.o. female with past medical history relevant for atrial fibrillation with status post prior pacemaker placement and history of prior ESBL E. coli UTI on underlying dementia presents to the ED after a fall  -Patient also had exposure to COVID-19 COVID-19 testing is positive in the ED patient is found to be hypoxic requiring 2 L of oxygen  History is very limited due to advanced dementia with significant cognitive deficits  -Chest x-ray with possible atelectasis  versus pneumonia  -CRP is elevated at 2.2, D-dimer is 2.41 patient is on Coumadin -White count of 3.6 -UA suspicious for UTI -Blood cultures pending -Fevers above 102 noted   Review of systems:    In addition to the HPI above,   A full Review of  Systems was done, all other systems reviewed are negative except as noted above in HPI , .    Social History:  Reviewed by me    Social History   Tobacco Use  . Smoking status: Never Smoker  . Smokeless tobacco: Never Used  Substance Use Topics  . Alcohol use: No       Family History :  Reviewed by me    Family History  Problem Relation Age of Onset  . Cancer Other      Home Medications:   Prior to Admission medications   Medication Sig Start Date End Date Taking? Authorizing Provider  cephALEXin (KEFLEX) 250 MG capsule Take  250 mg by mouth at bedtime. 10/28/19  Yes [provider]  furosemide (LASIX) 40 MG tablet Take 20 mg by mouth daily.  09/25/14  Yes [provider]  guaifenesin (ROBITUSSIN) 100 MG/5ML syrup Take 200-400 mg by mouth 4 (four) times daily as needed for cough.    Yes [provider]  loratadine (CLARITIN) 10 MG tablet Take 10 mg by mouth daily as needed for allergies.   Yes [provider]  metoprolol tartrate (LOPRESSOR) 25 MG tablet Take 1 tablet each morning and 1/2 tablet each evening.  (okay to crush) Patient taking differently: Take 12.5-25 mg by mouth See admin instructions. Take 1 tablet each morning and 1/2 tablet each evening.  (okay to crush) 08/01/19  Yes Croitoru, Mihai, MD  Multiple Vitamins-Minerals (CENTRUM SILVER PO) Take 1 tablet by mouth daily.     Yes [provider]  omeprazole (PRILOSEC) 20 MG capsule Take 20 mg by mouth every morning.    Yes [provider]  potassium chloride SA (K-DUR,KLOR-CON) 20 MEQ tablet Take 20 mEq by mouth every morning.  09/24/14  Yes [provider]  simvastatin (ZOCOR) 10 MG tablet Take 10 mg by mouth daily.   Yes [provider]  Travoprost, BAK Free, (TRAVATAN Z) 0.004 % SOLN ophthalmic solution Place 1 drop into both eyes at bedtime. 06/13/17  Yes [provider]  warfarin (COUMADIN) 3 MG tablet TAKE 1 TABLET DAILY EXCEPT TAKE 1/2 TABLET ON MONDAYS AND THURSDAYS OR AS DIRECTED. Patient taking differently: Take 1.5-3 mg by mouth See admin instructions. TAKE 1 TABLET DAILY EXCEPT TAKE 1/2 TABLET ON MONDAYS AND THURSDAYS OR AS DIRECTED. 12/06/19  Yes Croitoru, Mihai, MD  nitrofurantoin, macrocrystal-monohydrate, (MACROBID) 100 MG capsule Take 1 capsule (100 mg total) by mouth 2 (two) times daily. 12/29/18   Samuel Jester, DO     Allergies:     Allergies  Allergen Reactions  . Contrast Media [Iodinated Diagnostic Agents] Other (See Comments)    "shaking"  . Penicillins  Other (See Comments)    Has patient had a PCN reaction causing immediate rash, facial/tongue/throat swelling, SOB or lightheadedness with hypotension: Unknown Has patient had a PCN reaction causing severe rash involving mucus membranes or skin necrosis: Unknown Has patient had a PCN reaction that required hospitalization: Unknown Has patient had a PCN reaction occurring within the last 10 years: Unknown If all of the above answers are "NO", then may proceed with Cephalosporin use.  . Sulfa Antibiotics      Physical Exam:  Vitals  Blood pressure 113/70, pulse 63, temperature (!) 102.3 F (39.1 C), temperature source Rectal, resp. rate (!) 22, SpO2 97 %.  Physical Examination: General appearance -confused, sleepy mental status -disoriented with cognitive and memory deficits Eyes - sclera anicteric Neck - supple, no JVD elevation , Chest -diminished bilaterally with scattered wheezes Heart - S1 and S2 normal, irregular, left subclavian pacemaker in situ Abdomen - soft, nontender, nondistended,  Neurological -exam limited due to disorientation and lethargy extremities - no pedal edema noted, intact peripheral pulses  Skin - warm, dry     Data Review:    CBC Recent Labs  Lab 12/23/19 1315  WBC 3.6*  HGB 12.8  HCT 42.2  PLT 117*  MCV 98.4  MCH 29.8  MCHC 30.3  RDW 14.7  LYMPHSABS 0.8  MONOABS 0.3  EOSABS 0.0  BASOSABS 0.0   ------------------------------------------------------------------------------------------------------------------  Chemistries  Recent Labs  Lab 12/23/19 1315  NA 135  K 4.5  CL 104  CO2 24  GLUCOSE 151*  BUN 29*  CREATININE 1.16*  CALCIUM 8.0*  AST 41  ALT 18  ALKPHOS 60  BILITOT 0.5   ------------------------------------------------------------------------------------------------------------------ CrCl cannot be calculated (Unknown ideal  weight.). ------------------------------------------------------------------------------------------------------------------ No results for input(s): TSH, T4TOTAL, T3FREE, THYROIDAB in the last 72 hours.  Invalid input(s): FREET3   Coagulation profile No results for input(s): INR, PROTIME in the last 168 hours. ------------------------------------------------------------------------------------------------------------------- Recent Labs    12/23/19 1315  DDIMER 2.41*   -------------------------------------------------------------------------------------------------------------------  Cardiac Enzymes No results for input(s): CKMB, TROPONINI, MYOGLOBIN in the last 168 hours.  Invalid input(s): CK ------------------------------------------------------------------------------------------------------------------ No results found for: BNP   ---------------------------------------------------------------------------------------------------------------  Urinalysis    Component Value Date/Time   COLORURINE YELLOW 12/23/2019 1245   APPEARANCEUR CLOUDY (A) 12/23/2019 1245   LABSPEC 1.013 12/23/2019 1245   PHURINE 6.0 12/23/2019 1245   GLUCOSEU NEGATIVE 12/23/2019 1245   HGBUR SMALL (A) 12/23/2019 1245   BILIRUBINUR NEGATIVE 12/23/2019 1245   KETONESUR NEGATIVE 12/23/2019 1245   PROTEINUR 30 (A) 12/23/2019 1245   UROBILINOGEN 0.2 10/05/2015 1208   NITRITE POSITIVE (A) 12/23/2019 1245   LEUKOCYTESUR LARGE (A) 12/23/2019 1245    ----------------------------------------------------------------------------------------------------------------   Imaging Results:    DG Chest Portable 1 View  Result Date: 12/23/2019 CLINICAL DATA:  Cough. EXAM: PORTABLE CHEST 1 VIEW COMPARISON:  11/29/2018 and 11/24/2018 FINDINGS: Chronic cardiomegaly with extensive aortic atherosclerosis. Pulmonary vascularity is within normal limits. Slight new atelectasis at the left base. Single tiny area of  atelectasis at the right base. Pacemaker in place. Old compression fracture in the lower thoracic spine. IMPRESSION: New slight atelectasis at the left base. Minimal new atelectasis at the right base. Chronic cardiomegaly.  Aortic Atherosclerosis (ICD10-I70.0). Electronically Signed   By: Lorriane Shire M.D.   On: 12/23/2019 13:12    Radiological Exams on Admission: DG Chest Portable 1 View  Result Date: 12/23/2019 CLINICAL DATA:  Cough. EXAM: PORTABLE CHEST 1 VIEW COMPARISON:  11/29/2018 and 11/24/2018 FINDINGS: Chronic cardiomegaly with extensive aortic atherosclerosis. Pulmonary vascularity is within normal limits. Slight new atelectasis at the left base. Single tiny area of atelectasis at the right base. Pacemaker in place. Old compression fracture in the lower thoracic spine. IMPRESSION: New slight atelectasis at the left base. Minimal new atelectasis at the right base. Chronic cardiomegaly.  Aortic Atherosclerosis (ICD10-I70.0). Electronically Signed   By: Lorriane Shire M.D.   On: 12/23/2019 13:12    DVT Prophylaxis -SCD /Coumadin AM Labs Ordered, also please review Full Orders  Family Communication: Admission,  patients condition and plan of care including tests being ordered have been discussed with the patient and family who indicate understanding and agree with the plan   Code Status -DNR Likely DC to  TBD  Condition   fair  Shon Hale M.D on 12/23/2019 at 5:22 PM Go to [email or web address] -  for contact info  [facility] - Office  [phone]

## 2019-12-23 NOTE — ED Triage Notes (Signed)
Per ems pt was going with her family to be checked for covid because of recent exposure and fell because was weak.  Reports fell onto wet ground, did not lose consciousness.  Reports pt has dementia.  EMS says family reports worsening generalized weakness and mental status.  EMS says pt has very congested cough.  Pt oriented to self.

## 2019-12-23 NOTE — ED Provider Notes (Signed)
Emergency Department Provider Note   I have reviewed the triage vital signs and the nursing notes.   HISTORY  Chief Complaint Weakness and Cough   HPI [name] is a 86 y.o. female with PMH of dementia and other history listed below presents to the emergency department with generalized weakness, worsening confusion, cough, and fall today.  Patient was walking when she fell to the ground.  There was no head injury or loss of consciousness.  Family was there to witness the event and helped her to the ground.  Family state that over the last several days has become increasingly confused and generally weak.  She has been unable to tell them if she is having pain.  She is having to be fed which is not typical for her.   Level 5 caveat: Dementia and Encephalopathy.   Past Medical History:  Diagnosis Date  . Acid reflux   . Atrial fibrillation ([place])   . Atrophic vaginitis   . Chronic cystitis   . Decubitus ulcer of buttock   . Dementia ([name])   . Hyperlipidemia   . Hypertension   . Incomplete bladder emptying   . Microscopic hematuria   . Pacemaker   . Straining on urination   . Symptomatic bradycardia   . Urge incontinence of urine   . UTI (lower urinary tract infection)     Patient Active Problem List   Diagnosis Date Noted  . Acute respiratory disease due to COVID-19 virus 12/23/2019  . Dementia without behavioral disturbance ([place]) 12/23/2019  . Acute lower UTI--- H/o E coli ESBL 12/23/2019  . Pressure injury of skin 12/05/2017  . Metabolic encephalopathy 49/70/2637  . Hyperlipidemia 11/14/2013  . Mitral insufficiency 11/14/2013  . Symptomatic bradycardia 07/05/2013  . Pacemaker - Medtronic 2007, new generator 2014 07/05/2013  . Atrial fibrillation ([place]) 03/15/2013  . [name] term current use of anticoagulant therapy 03/15/2013    Past Surgical History:  Procedure Laterality Date  . CARDIAC CATHETERIZATION  04/2004  . PACEMAKER GENERATOR CHANGE N/A 07/05/2013   Procedure: PACEMAKER GENERATOR CHANGE;  Surgeon: Sanda Klein, MD;  Location: [facility] CATH LAB;  Service: Cardiovascular;  Laterality: N/A;  . PACEMAKER INSERTION      Allergies Contrast media [iodinated diagnostic agents], Penicillins, and Sulfa antibiotics  Family History  Problem Relation Age of Onset  . Cancer Other     Social History Social History   Tobacco Use  . Smoking status: Never Smoker  . Smokeless tobacco: Never Used  Substance Use Topics  . Alcohol use: No  . Drug use: No    Review of Systems  Level 5 caveat: Dementia and Encephalopathy.   ____________________________________________   PHYSICAL EXAM:  VITAL SIGNS: ED Triage Vitals [12/23/19 1310]  Enc Vitals Group     BP (!) 129/47     Pulse Rate 69     Resp (!) 23     Temp (!) 101.3 F (38.5 C)     Temp Source Oral     SpO2 91 %   Constitutional: Awake but confused and unable to provide a history. Well appearing and in no acute distress. Eyes: Conjunctivae are normal.  Head: Atraumatic. Nose: No congestion/rhinnorhea. Mouth/Throat: Mucous membranes are moist.  Neck: No stridor.   Cardiovascular: Normal rate, regular rhythm. Good peripheral circulation. Grossly normal heart sounds.   Respiratory: Normal respiratory effort.  No retractions. Lungs CTAB. Gastrointestinal: Soft and nontender. No distention.  Musculoskeletal: No gross deformities of extremities. Neurologic:  Normal speech and  language. Moving all extremities equally and following commands.  Skin:  Skin is warm, dry and intact. No rash noted.  ____________________________________________   LABS (all labs ordered are listed, but only abnormal results are displayed)  Labs Reviewed  CBC WITH DIFFERENTIAL/PLATELET - Abnormal; Notable for the following components:      Result Value   WBC 3.6 (*)    Platelets 117 (*)    All other components within normal limits  COMPREHENSIVE METABOLIC PANEL - Abnormal; Notable for the following  components:   Glucose, Bld 151 (*)    BUN 29 (*)    Creatinine, Ser 1.16 (*)    Calcium 8.0 (*)    Albumin 3.2 (*)    GFR calc non Af Amer 41 (*)    GFR calc Af Amer 48 (*)    All other components within normal limits  URINALYSIS, ROUTINE W REFLEX MICROSCOPIC - Abnormal; Notable for the following components:   APPearance CLOUDY (*)    Hgb urine dipstick SMALL (*)    Protein, ur 30 (*)    Nitrite POSITIVE (*)    Leukocytes,Ua LARGE (*)    WBC, UA >50 (*)    Bacteria, UA MANY (*)    All other components within normal limits  LACTIC ACID, PLASMA - Abnormal; Notable for the following components:   Lactic Acid, Venous 2.0 (*)    All other components within normal limits  D-DIMER, QUANTITATIVE (NOT AT [facility]) - Abnormal; Notable for the following components:   D-Dimer, Quant 2.41 (*)    All other components within normal limits  C-REACTIVE PROTEIN - Abnormal; Notable for the following components:   CRP 2.2 (*)    All other components within normal limits  CBC WITH DIFFERENTIAL/PLATELET - Abnormal; Notable for the following components:   WBC 2.5 (*)    Platelets 106 (*)    Neutro Abs 1.6 (*)    All other components within normal limits  PROTIME-INR - Abnormal; Notable for the following components:   Prothrombin Time 19.9 (*)    INR 1.7 (*)    All other components within normal limits  GLUCOSE, CAPILLARY - Abnormal; Notable for the following components:   Glucose-Capillary 126 (*)    All other components within normal limits  POC SARS CORONAVIRUS 2 AG -  ED - Abnormal; Notable for the following components:   SARS Coronavirus 2 Ag POSITIVE (*)    All other components within normal limits  CULTURE, BLOOD (ROUTINE X 2)  CULTURE, BLOOD (ROUTINE X 2)  LACTIC ACID, PLASMA  PROCALCITONIN  LACTATE DEHYDROGENASE  FERRITIN  TRIGLYCERIDES  FIBRINOGEN  COMPREHENSIVE METABOLIC PANEL  C-REACTIVE PROTEIN  D-DIMER, QUANTITATIVE (NOT AT [facility])  FERRITIN  MAGNESIUM  PHOSPHORUS  CBC  CBC    CBC  PROTIME-INR  ABO/RH   ____________________________________________  EKG   EKG Interpretation  Date/Time:  Monday December 23 2019 13:09:15 EST Ventricular Rate:  72 PR Interval:    QRS Duration: 166 QT Interval:  437 QTC Calculation: 479 R Axis:   -92 Text Interpretation: V paced rhythm. No STEMI Confirmed by Alona Bene [phone]) on 12/23/2019 1:14:18 PM       ____________________________________________  RADIOLOGY  DG Chest Portable 1 View  Result Date: 12/23/2019 CLINICAL DATA:  Cough. EXAM: PORTABLE CHEST 1 VIEW COMPARISON:  11/29/2018 and 11/24/2018 FINDINGS: Chronic cardiomegaly with extensive aortic atherosclerosis. Pulmonary vascularity is within normal limits. Slight new atelectasis at the left base. Single tiny area of atelectasis at the right base. Pacemaker in  place. Old compression fracture in the lower thoracic spine. IMPRESSION: New slight atelectasis at the left base. Minimal new atelectasis at the right base. Chronic cardiomegaly.  Aortic Atherosclerosis (ICD10-I70.0). Electronically Signed   By: Francene Boyers M.D.   On: 12/23/2019 13:12    ____________________________________________   PROCEDURES  Procedure(s) performed:   Procedures  CRITICAL CARE Performed by: Maia Plan Total critical care time: 35 minutes Critical care time was exclusive of separately billable procedures and treating other patients. Critical care was necessary to treat or prevent imminent or life-threatening deterioration. Critical care was time spent personally by me on the following activities: development of treatment plan with patient and/or surrogate as well as nursing, discussions with consultants, evaluation of patient's response to treatment, examination of patient, obtaining history from patient or surrogate, ordering and performing treatments and interventions, ordering and review of laboratory studies, ordering and review of radiographic studies, pulse oximetry and  re-evaluation of patient's condition.  Alona Bene, MD Emergency Medicine  ____________________________________________   INITIAL IMPRESSION / ASSESSMENT AND PLAN / ED COURSE  Pertinent labs & imaging results that were available during my care of the patient were reviewed by me and considered in my medical decision making (see chart for details).   Patient presents to the emergency department for evaluation of generalized weakness and confusion.  She had a fall today with no apparent injury on my exam.  She is febrile on arrival with no tachycardia.  Mild tachypnea but in no respiratory distress.  No hypoxemia.   Covid point-of-care test is positive.  Patient also with evidence of a urinary tract infection which may be contributing to encephalopathy type presentation.  No focal neurologic deficits.  I had a discussion with the patient's daughter by phone.  We discussed her diagnoses, presentation, CODE STATUS.  After discussion the family feels that she would want to be a DO NOT RESUSCITATE noting that she would not want to be on the ventilator and given her age and underlying medical problems they would not want to put her through CPR if she has cardiac arrest. I have reflected this in the chart.   Discussed patient's case with [facility] to request admission. Patient and family (if present) updated with plan. Care transferred to [facility] [employer] service.  I reviewed all nursing notes, vitals, pertinent old records, EKGs, labs, imaging (as available).  ____________________________________________  FINAL CLINICAL IMPRESSION(S) / ED DIAGNOSES  Final diagnoses:  Encephalopathy acute  COVID-19  Acute cystitis without hematuria     MEDICATIONS GIVEN DURING THIS VISIT:  Medications  remdesivir 200 mg in sodium chloride 0.9% 250 mL IVPB (0 mg Intravenous Stopping Infusion hung by another clincian 12/23/19 1830)    Followed by  remdesivir 100 mg in sodium chloride 0.9 % 100 mL IVPB (has no administration in  time range)  dexamethasone (DECADRON) injection 6 mg (6 mg Intravenous Given 12/23/19 1805)  guaiFENesin-dextromethorphan (ROBITUSSIN DM) 100-10 MG/5ML syrup 10 mL (has no administration in time range)  chlorpheniramine-HYDROcodone (TUSSIONEX) 10-8 MG/5ML suspension 5 mL (has no administration in time range)  ascorbic acid (VITAMIN C) tablet 500 mg (500 mg Oral Not Given 12/23/19 1500)  zinc sulfate capsule 220 mg (220 mg Oral Not Given 12/23/19 1500)  folic acid (FOLVITE) tablet 1 mg (1 mg Oral Not Given 12/23/19 1500)  multivitamin with minerals tablet 1 tablet (1 tablet Oral Not Given 12/23/19 1500)  thiamine tablet 100 mg (100 mg Oral Not Given 12/23/19 1500)  guaiFENesin (MUCINEX) 12 hr tablet 600 mg (  600 mg Oral Not Given 12/23/19 1804)  QUEtiapine (SEROQUEL) tablet 25 mg (25 mg Oral Not Given 12/23/19 2058)  meropenem (MERREM) 1 g in sodium chloride 0.9 % 100 mL IVPB ( Intravenous Stopped 12/23/19 2126)  metoprolol tartrate (LOPRESSOR) tablet 12.5 mg (12.5 mg Oral Not Given 12/23/19 2058)  pantoprazole (PROTONIX) EC tablet 40 mg (has no administration in time range)  simvastatin (ZOCOR) tablet 10 mg (10 mg Oral Not Given 12/23/19 2059)  latanoprost (XALATAN) 0.005 % ophthalmic solution 1 drop (1 drop Both Eyes Given 12/23/19 2057)  sodium chloride flush (NS) 0.9 % injection 3 mL (3 mLs Intravenous Given 12/23/19 2059)  sodium chloride flush (NS) 0.9 % injection 3 mL (has no administration in time range)  0.9 %  sodium chloride infusion ( Intravenous Stopped 12/24/19 0047)  acetaminophen (TYLENOL) tablet 650 mg (has no administration in time range)    Or  acetaminophen (TYLENOL) suppository 650 mg (has no administration in time range)  traZODone (DESYREL) tablet 50 mg (has no administration in time range)  polyethylene glycol (MIRALAX / GLYCOLAX) packet 17 g (has no administration in time range)  ondansetron (ZOFRAN) tablet 4 mg (has no administration in time range)    Or  ondansetron (ZOFRAN)  injection 4 mg (has no administration in time range)  metoprolol tartrate (LOPRESSOR) tablet 25 mg (has no administration in time range)  dextrose 5 %-0.45 % sodium chloride infusion ( Intravenous Rate/Dose Verify 12/24/19 0300)  albuterol (VENTOLIN HFA) 108 (90 Base) MCG/ACT inhaler 2 puff (has no administration in time range)  warfarin (COUMADIN) tablet 3 mg (3 mg Oral Not Given 12/23/19 2106)  Warfarin - Pharmacist Dosing Inpatient (has no administration in time range)  cefTRIAXone (ROCEPHIN) 1 g in sodium chloride 0.9 % 100 mL IVPB (0 g Intravenous Stopped 12/23/19 1650)  acetaminophen (TYLENOL) tablet 1,000 mg (1,000 mg Oral Given 12/23/19 1507)    Note:  This document was prepared using Dragon voice recognition software and may include unintentional dictation errors.  Alona Bene, MD, [facility] Emergency Medicine    Tamyka Bezio, Arlyss Repress, MD 12/24/19 [phone]

## 2019-12-23 NOTE — Progress Notes (Signed)
ANTICOAGULATION CONSULT NOTE - Initial Consult  Pharmacy Consult for warfarin Indication: atrial fibrillation  Allergies  Allergen Reactions  . Contrast Media [Iodinated Diagnostic Agents] Other (See Comments)    "shaking"  . Penicillins Other (See Comments)    Has patient had a PCN reaction causing immediate rash, facial/tongue/throat swelling, SOB or lightheadedness with hypotension: Unknown Has patient had a PCN reaction causing severe rash involving mucus membranes or skin necrosis: Unknown Has patient had a PCN reaction that required hospitalization: Unknown Has patient had a PCN reaction occurring within the last 10 years: Unknown If all of the above answers are "NO", then may proceed with Cephalosporin use.  . Sulfa Antibiotics     Patient Measurements: Height: 5' (152.4 cm) Weight: 153 lb (69.4 kg) IBW/kg (Calculated) : 45.5   Vital Signs: Temp: 98.7 F (37.1 C) (01/25 1817) Temp Source: Rectal (01/25 1649) BP: 127/52 (01/25 1817) Pulse Rate: 84 (01/25 1817)  Labs: Recent Labs    12/23/19 1315  HGB 12.8  HCT 42.2  PLT 117*  LABPROT 19.9*  INR 1.7*  CREATININE 1.16*    Estimated Creatinine Clearance: 28 mL/min (A) (by C-G formula based on SCr of 1.16 mg/dL (H)).   Medical History: Past Medical History:  Diagnosis Date  . Acid reflux   . Atrial fibrillation ([place])   . Atrophic vaginitis   . Chronic cystitis   . Decubitus ulcer of buttock   . Dementia ([place])   . Hyperlipidemia   . Hypertension   . Incomplete bladder emptying   . Microscopic hematuria   . Pacemaker   . Straining on urination   . Symptomatic bradycardia   . Urge incontinence of urine   . UTI (lower urinary tract infection)     Medications:  Medications Prior to Admission  Medication Sig Dispense Refill Last Dose  . cephALEXin (KEFLEX) 250 MG capsule Take 250 mg by mouth at bedtime.   12/22/2019 at Unknown time  . furosemide (LASIX) 40 MG tablet Take 20 mg by mouth daily.     12/23/2019 at Unknown time  . guaifenesin (ROBITUSSIN) 100 MG/5ML syrup Take 200-400 mg by mouth 4 (four) times daily as needed for cough.    12/23/2019 at Unknown time  . loratadine (CLARITIN) 10 MG tablet Take 10 mg by mouth daily as needed for allergies.   12/23/2019 at Unknown time  . metoprolol tartrate (LOPRESSOR) 25 MG tablet Take 1 tablet each morning and 1/2 tablet each evening.  (okay to crush) (Patient taking differently: Take 12.5-25 mg by mouth See admin instructions. Take 1 tablet each morning and 1/2 tablet each evening.  (okay to crush)) 135 tablet 3 12/23/2019 at 1030  . Multiple Vitamins-Minerals (CENTRUM SILVER PO) Take 1 tablet by mouth daily.     12/23/2019 at Unknown time  . omeprazole (PRILOSEC) 20 MG capsule Take 20 mg by mouth every morning.    12/23/2019 at Unknown time  . potassium chloride SA (K-DUR,KLOR-CON) 20 MEQ tablet Take 20 mEq by mouth every morning.    12/23/2019 at Unknown time  . simvastatin (ZOCOR) 10 MG tablet Take 10 mg by mouth daily.   12/22/2019 at Unknown time  . Travoprost, BAK Free, (TRAVATAN Z) 0.004 % SOLN ophthalmic solution Place 1 drop into both eyes at bedtime.   12/22/2019 at Unknown time  . warfarin (COUMADIN) 3 MG tablet TAKE 1 TABLET DAILY EXCEPT TAKE 1/2 TABLET ON MONDAYS AND THURSDAYS OR AS DIRECTED. (Patient taking differently: Take 1.5-3 mg by mouth See admin  instructions. TAKE 1 TABLET DAILY EXCEPT TAKE 1/2 TABLET ON MONDAYS AND THURSDAYS OR AS DIRECTED.) 90 tablet 1 12/22/2019 at 1830  . nitrofurantoin, macrocrystal-monohydrate, (MACROBID) 100 MG capsule Take 1 capsule (100 mg total) by mouth 2 (two) times daily. 14 capsule 0     Assessment: Pharmacy consulted to dose warfarin in patient with atrial fibrillation.  Patient's INR on admission is 1.7 with last dose given 1/24 1830.  Home regiment lsted as 1.5 mg on Mon-Thurs and 3 mg ROW.  Goal of Therapy:  INR 2-3 Monitor platelets by anticoagulation protocol: Yes   Plan:  Warfarin 3 mg x 1  dose. Monitor labs and s/s of bleeding  Tad Moore 12/23/2019,9:01 PM

## 2019-12-23 NOTE — ED Notes (Signed)
Date and time results received: 12/23/19 1412   Test: Lactic Acid Critical Value: 2.0  Name of Provider Notified: Dr. Jacqulyn Bath  Orders Received? Or Actions Taken?: No new orders given.

## 2019-12-24 LAB — GLUCOSE, CAPILLARY
Glucose-Capillary: 117 mg/dL — ABNORMAL HIGH (ref 70–99)
Glucose-Capillary: 134 mg/dL — ABNORMAL HIGH (ref 70–99)
Glucose-Capillary: 128 mg/dL — ABNORMAL HIGH (ref 70–99)
Glucose-Capillary: 142 mg/dL — ABNORMAL HIGH (ref 70–99)

## 2019-12-24 LAB — D-DIMER, QUANTITATIVE: D-Dimer, Quant: 2.05 ug/mL-FEU — ABNORMAL HIGH (ref 0.00–0.50)

## 2019-12-24 LAB — CBC WITH DIFFERENTIAL/PLATELET
Abs Immature Granulocytes: 0.01 10*3/uL (ref 0.00–0.07)
Basophils Absolute: 0 10*3/uL (ref 0.0–0.1)
Basophils Relative: 0 %
Eosinophils Absolute: 0 10*3/uL (ref 0.0–0.5)
Eosinophils Relative: 0 %
HCT: 42.4 % (ref 36.0–46.0)
Hemoglobin: 12.7 g/dL (ref 12.0–15.0)
Immature Granulocytes: 0 %
Lymphocytes Relative: 30 %
Lymphs Abs: 0.8 10*3/uL (ref 0.7–4.0)
MCH: 29.7 pg (ref 26.0–34.0)
MCHC: 30 g/dL (ref 30.0–36.0)
MCV: 99.3 fL (ref 80.0–100.0)
Monocytes Absolute: 0.1 10*3/uL (ref 0.1–1.0)
Monocytes Relative: 6 %
Neutro Abs: 1.6 10*3/uL — ABNORMAL LOW (ref 1.7–7.7)
Neutrophils Relative %: 64 %
Platelets: 106 10*3/uL — ABNORMAL LOW (ref 150–400)
RBC: 4.27 MIL/uL (ref 3.87–5.11)
RDW: 14.6 % (ref 11.5–15.5)
WBC: 2.5 10*3/uL — ABNORMAL LOW (ref 4.0–10.5)
nRBC: 0 % (ref 0.0–0.2)

## 2019-12-24 LAB — COMPREHENSIVE METABOLIC PANEL
ALT: 15 U/L (ref 0–44)
AST: 34 U/L (ref 15–41)
Albumin: 2.6 g/dL — ABNORMAL LOW (ref 3.5–5.0)
Alkaline Phosphatase: 55 U/L (ref 38–126)
Anion gap: 7 (ref 5–15)
BUN: 28 mg/dL — ABNORMAL HIGH (ref 8–23)
CO2: 24 mmol/L (ref 22–32)
Calcium: 8 mg/dL — ABNORMAL LOW (ref 8.9–10.3)
Chloride: 108 mmol/L (ref 98–111)
Creatinine, Ser: 0.85 mg/dL (ref 0.44–1.00)
GFR calc Af Amer: >60 mL/min (ref >60)
GFR calc non Af Amer: >60 mL/min (ref >60)
Glucose, Bld: 159 mg/dL — ABNORMAL HIGH (ref 70–99)
Potassium: 4.6 mmol/L (ref 3.5–5.1)
Sodium: 139 mmol/L (ref 135–145)
Total Bilirubin: 0.5 mg/dL (ref 0.3–1.2)
Total Protein: 5.9 g/dL — ABNORMAL LOW (ref 6.5–8.1)

## 2019-12-24 LAB — PROTIME-INR
INR: 1.9 — ABNORMAL HIGH (ref 0.8–1.2)
Prothrombin Time: 21.4 seconds — ABNORMAL HIGH (ref 11.4–15.2)

## 2019-12-24 LAB — PHOSPHORUS: Phosphorus: 3.4 mg/dL (ref 2.5–4.6)

## 2019-12-24 LAB — C-REACTIVE PROTEIN: CRP: 2.4 mg/dL — ABNORMAL HIGH (ref <1.0)

## 2019-12-24 LAB — MAGNESIUM: Magnesium: 1.9 mg/dL (ref 1.7–2.4)

## 2019-12-24 LAB — FERRITIN: Ferritin: 287 ng/mL (ref 11–307)

## 2019-12-24 MED ORDER — WARFARIN SODIUM 1 MG PO TABS
3.0000 mg | ORAL_TABLET | Freq: Once | ORAL | Status: AC
  Administered 2019-12-24: 3 mg via ORAL
  Filled 2019-12-24: qty 3

## 2019-12-24 NOTE — Plan of Care (Signed)

## 2019-12-24 NOTE — Progress Notes (Signed)
Patient Demographics:    [name], is a 86 y.o. female, DOB - 1926-12-16, FVC:[id number]  Admit date - 12/23/2019   Admitting Physician Hallis Meditz Mariea Clonts, MD  Outpatient Primary MD for the patient is Avon Gully, MD  LOS - 1   Chief Complaint  Patient presents with  . Weakness  . Cough        Subjective:    [name] today has no fevers, no emesis,  No chest pain,  - She is more awake, oral intake is improving  Assessment  & Plan :    Principal Problem:   Acute respiratory disease due to COVID-19 virus Active Problems:   Atrial fibrillation [facility])   Pacemaker - [employer]onic 2007, new generator 2014   Long term current use of anticoagulant therapy   Dementia without behavioral disturbance (HCC)   Acute lower UTI--- H/o E coli ESBL   Brief Summary:- 86 y.o. female with past medical history relevant for atrial fibrillation with status post prior pacemaker placement and history of prior ESBL E. coli UTI   A/p 1)Acute hypoxic respiratory failure secondary to COVID-19 infection/pneumonia-- --Patient is positive for COVID-19 infection, chest x-ray with findings of infiltrates/opacities,  patient is hypoxic and requiring continuous supplemental oxygen--- - c/n  Remdesivir AND  Steroid therapy per protocol -started on 12/23/19 --Check inflammatory markers- --Supplemental oxygen to keep O2 sats above 93% -Follow serial chest x-rays and ABGs as indicated --- Encourage prone positioning for More than 16 hours/day in increments of 2 to 3 hours at a time if able to tolerate --Attempt to maintain euvolemic state --Zinc and vitamin C as ordered -Albuterol inhaler as needed  2)Presumed UTI--- H/o Prior ESBL E. coli UTI--treat empirically with IV Meropenem pending culture results  3) chronic atrial fibrillation/S/p PPM-- coumadin and metoprolol as ordered, INR is 1.9  4) social/ethics-- patient is  a DNR  5) advanced dementia--- patient with significant cognitive difficulties, no significant agitation or behavioral problems --Continue routine cares  Disposition/Need for in-Hospital Stay- patient unable to be discharged at this time due to --Covid pneumonia with hypoxia requiring IV steroid and IV remdesivir supplemental oxygen  Code Status : DNR  Family Communication:    -Called and discussed with patient's daughter  Disposition Plan  : Possible discharge home with home health when clinically improved -most likely after completion of 5 days of IV remdesivir  Consults  :  na  DVT Prophylaxis  : Coumadin- SCDs *  Lab Results  Component Value Date   PLT 106 (L) 12/24/2019    Inpatient Medications  Scheduled Meds: . albuterol  2 puff Inhalation TID  . vitamin C  500 mg Oral Daily  . dexamethasone (DECADRON) injection  6 mg Intravenous Q24H  . folic acid  1 mg Oral Daily  . guaiFENesin  600 mg Oral BID  . latanoprost  1 drop Both Eyes QHS  . metoprolol tartrate  12.5 mg Oral QHS  . metoprolol tartrate  25 mg Oral Daily  . multivitamin with minerals  1 tablet Oral Daily  . pantoprazole  40 mg Oral Daily  . QUEtiapine  25 mg Oral QHS  . simvastatin  10 mg Oral Daily  . sodium chloride flush  3 mL Intravenous Q12H  .  thiamine  100 mg Oral Daily  . warfarin  3 mg Oral Once  . Warfarin - Pharmacist Dosing Inpatient   Does not apply q1800  . zinc sulfate  220 mg Oral Daily   Continuous Infusions: . sodium chloride Stopped (12/24/19 0045)  . dextrose 5 % and 0.45% NaCl 50 mL/hr at 12/24/19 1719  . meropenem (MERREM) IV 1 g (12/24/19 0836)  . remdesivir 100 mg in NS 100 mL Stopped (12/24/19 1036)   PRN Meds:.sodium chloride, acetaminophen **OR** acetaminophen, chlorpheniramine-HYDROcodone, guaiFENesin-dextromethorphan, ondansetron **OR** ondansetron (ZOFRAN) IV, polyethylene glycol, sodium chloride flush, traZODone    Anti-infectives (From admission, onward)   Start      Dose/Rate Route Frequency Ordered Stop   12/24/19 1500  cefTRIAXone (ROCEPHIN) 1 g in sodium chloride 0.9 % 100 mL IVPB  Status:  Discontinued     1 g 200 mL/hr over 30 Minutes Intravenous Every 24 hours 12/23/19 1507 12/23/19 1727   12/24/19 1000  remdesivir 100 mg in sodium chloride 0.9 % 100 mL IVPB     100 mg 200 mL/hr over 30 Minutes Intravenous Daily 12/23/19 1457 12/28/19 0959   12/23/19 2200  meropenem (MERREM) 1 g in sodium chloride 0.9 % 100 mL IVPB     1 g 200 mL/hr over 30 Minutes Intravenous Every 12 hours 12/23/19 1727     12/23/19 1530  remdesivir 200 mg in sodium chloride 0.9% 250 mL IVPB     200 mg 580 mL/hr over 30 Minutes Intravenous Once 12/23/19 1457 12/23/19 1830   12/23/19 1500  cefTRIAXone (ROCEPHIN) 1 g in sodium chloride 0.9 % 100 mL IVPB     1 g 200 mL/hr over 30 Minutes Intravenous  Once 12/23/19 1434 12/23/19 1650        Objective:   Vitals:   12/24/19 0632 12/24/19 1300 12/24/19 1335 12/24/19 1933  BP: (!) 112/41 128/68    Pulse: 89 86    Resp: 20     Temp:  98.2 F (36.8 C)    TempSrc: Oral Oral    SpO2: 95% 95% 94% 94%  Weight:      Height:        Wt Readings from Last 3 Encounters:  12/23/19 69.4 kg  01/01/19 69.7 kg  12/29/18 71.7 kg     Intake/Output Summary (Last 24 hours) at 12/24/2019 1942 Last data filed at 12/24/2019 1719 Gross per 24 hour  Intake 1391.91 ml  Output 351 ml  Net 1040.91 ml     Physical Exam  Gen:- Awake Alert, resting HEENT:- [place].AT, No sclera icterus Nose- [place] 2 L/min Neck-Supple Neck,No JVD,.  Lungs-diminished in bases, scant wheezing CV- S1, S2 normal, regular  Abd-  +ve B.Sounds, Abd Soft, No tenderness,    Extremity/Skin:- No  edema, pedal pulses present  Psych-pleasantly confused, disoriented, underlying baseline cognitive and memory deficits Neuro-generalized weakness, no new focal deficits, no tremors   Data Review:   Micro Results Recent Results (from the past 240 hour(s))  Blood Culture  (routine x 2)     Status: None (Preliminary result)   Collection Time: 12/23/19  1:05 PM   Specimen: Right Antecubital; Blood  Result Value Ref Range Status   Specimen Description RIGHT ANTECUBITAL  Final   Special Requests   Final    BOTTLES DRAWN AEROBIC ONLY Blood Culture results may not be optimal due to an inadequate volume of blood received in culture bottles   Culture   Final    NO GROWTH < 24 HOURS  Performed at [facility], [address], [place], [place] [address]    Report Status PENDING  Incomplete  Blood Culture (routine x 2)     Status: None (Preliminary result)   Collection Time: 12/23/19  1:20 PM   Specimen: Left Antecubital; Blood  Result Value Ref Range Status   Specimen Description LEFT ANTECUBITAL  Final   Special Requests   Final    BOTTLES DRAWN AEROBIC AND ANAEROBIC Blood Culture adequate volume   Culture   Final    NO GROWTH < 24 HOURS Performed at [facility], [address], [place], [place] [address]    Report Status PENDING  Incomplete    Radiology Reports DG Chest Portable 1 View  Result Date: 12/23/2019 CLINICAL DATA:  Cough. EXAM: PORTABLE CHEST 1 VIEW COMPARISON:  11/29/2018 and 11/24/2018 FINDINGS: Chronic cardiomegaly with extensive aortic atherosclerosis. Pulmonary vascularity is within normal limits. Slight new atelectasis at the left base. Single tiny area of atelectasis at the right base. Pacemaker in place. Old compression fracture in the lower thoracic spine. IMPRESSION: New slight atelectasis at the left base. Minimal new atelectasis at the right base. Chronic cardiomegaly.  Aortic Atherosclerosis (ICD10-I70.0). Electronically Signed   By: Francene Boyers M.D.   On: 12/23/2019 13:12     CBC Recent Labs  Lab 12/23/19 1315 12/24/19 0557  WBC 3.6* 2.5*  HGB 12.8 12.7  HCT 42.2 42.4  PLT 117* 106*  MCV 98.4 99.3  MCH 29.8 29.7  MCHC 30.3 30.0  RDW 14.7 14.6  LYMPHSABS 0.8 0.8  MONOABS 0.3 0.1  EOSABS 0.0 0.0  BASOSABS 0.0 0.0     Chemistries  Recent Labs  Lab 12/23/19 1315 12/24/19 0557  NA 135 139  K 4.5 4.6  CL 104 108  CO2 24 24  GLUCOSE 151* 159*  BUN 29* 28*  CREATININE 1.16* 0.85  CALCIUM 8.0* 8.0*  MG  --  1.9  AST 41 34  ALT 18 15  ALKPHOS 60 55  BILITOT 0.5 0.5   ------------------------------------------------------------------------------------------------------------------ Recent Labs    12/23/19 1316  TRIG 94    No results found for: HGBA1C ------------------------------------------------------------------------------------------------------------------ No results for input(s): TSH, T4TOTAL, T3FREE, THYROIDAB in the last 72 hours.  Invalid input(s): FREET3 ------------------------------------------------------------------------------------------------------------------ Recent Labs    12/23/19 1316 12/24/19 0557  FERRITIN 199 287    Coagulation profile Recent Labs  Lab 12/23/19 1315 12/24/19 0557  INR 1.7* 1.9*    Recent Labs    12/23/19 1315 12/24/19 0557  DDIMER 2.41* 2.05*    Cardiac Enzymes No results for input(s): CKMB, TROPONINI, MYOGLOBIN in the last 168 hours.  Invalid input(s): CK ------------------------------------------------------------------------------------------------------------------ No results found for: BNP   Shon Hale M.D on 12/24/2019 at 7:42 PM  Go to [email or web address] - for contact info  [facility] - Office  [phone]

## 2019-12-24 NOTE — Progress Notes (Addendum)
ANTICOAGULATION CONSULT NOTE -  Pharmacy Consult for warfarin Indication: atrial fibrillation  Allergies  Allergen Reactions  . Contrast Media [Iodinated Diagnostic Agents] Other (See Comments)    "shaking"  . Penicillins Other (See Comments)    Has patient had a PCN reaction causing immediate rash, facial/tongue/throat swelling, SOB or lightheadedness with hypotension: Unknown Has patient had a PCN reaction causing severe rash involving mucus membranes or skin necrosis: Unknown Has patient had a PCN reaction that required hospitalization: Unknown Has patient had a PCN reaction occurring within the last 10 years: Unknown If all of the above answers are "NO", then may proceed with Cephalosporin use.  . Sulfa Antibiotics     Patient Measurements: Height: 5' (152.4 cm) Weight: 153 lb (69.4 kg) IBW/kg (Calculated) : 45.5   Vital Signs: Temp Source: Oral (01/26 0632) BP: 112/41 (01/26 0632) Pulse Rate: 89 (01/26 0632)  Labs: Recent Labs    12/23/19 1315 12/24/19 0557  HGB 12.8 12.7  HCT 42.2 42.4  PLT 117* 106*  LABPROT 19.9* 21.4*  INR 1.7* 1.9*  CREATININE 1.16* 0.85    Estimated Creatinine Clearance: 38.3 mL/min (by C-G formula based on SCr of 0.85 mg/dL).   Medical History: Past Medical History:  Diagnosis Date  . Acid reflux   . Atrial fibrillation (HCC)   . Atrophic vaginitis   . Chronic cystitis   . Decubitus ulcer of buttock   . Dementia ([facility])   . Hyperlipidemia   . Hypertension   . Incomplete bladder emptying   . Microscopic hematuria   . Pacemaker   . Straining on urination   . Symptomatic bradycardia   . Urge incontinence of urine   . UTI (lower urinary tract infection)     Medications:  Medications Prior to Admission  Medication Sig Dispense Refill Last Dose  . cephALEXin (KEFLEX) 250 MG capsule Take 250 mg by mouth at bedtime.   12/22/2019 at Unknown time  . furosemide (LASIX) 40 MG tablet Take 20 mg by mouth daily.    12/23/2019 at Unknown  time  . guaifenesin (ROBITUSSIN) 100 MG/5ML syrup Take 200-400 mg by mouth 4 (four) times daily as needed for cough.    12/23/2019 at Unknown time  . loratadine (CLARITIN) 10 MG tablet Take 10 mg by mouth daily as needed for allergies.   12/23/2019 at Unknown time  . metoprolol tartrate (LOPRESSOR) 25 MG tablet Take 1 tablet each morning and 1/2 tablet each evening.  (okay to crush) (Patient taking differently: Take 12.5-25 mg by mouth See admin instructions. Take 1 tablet each morning and 1/2 tablet each evening.  (okay to crush)) 135 tablet 3 12/23/2019 at 1030  . Multiple Vitamins-Minerals (CENTRUM SILVER PO) Take 1 tablet by mouth daily.     12/23/2019 at Unknown time  . omeprazole (PRILOSEC) 20 MG capsule Take 20 mg by mouth every morning.    12/23/2019 at Unknown time  . potassium chloride SA (K-DUR,KLOR-CON) 20 MEQ tablet Take 20 mEq by mouth every morning.    12/23/2019 at Unknown time  . simvastatin (ZOCOR) 10 MG tablet Take 10 mg by mouth daily.   12/22/2019 at Unknown time  . Travoprost, BAK Free, (TRAVATAN Z) 0.004 % SOLN ophthalmic solution Place 1 drop into both eyes at bedtime.   12/22/2019 at Unknown time  . warfarin (COUMADIN) 3 MG tablet TAKE 1 TABLET DAILY EXCEPT TAKE 1/2 TABLET ON MONDAYS AND THURSDAYS OR AS DIRECTED. (Patient taking differently: Take 1.5-3 mg by mouth See admin instructions. TAKE 1  TABLET DAILY EXCEPT TAKE 1/2 TABLET ON MONDAYS AND THURSDAYS OR AS DIRECTED.) 90 tablet 1 12/22/2019 at 1830  . nitrofurantoin, macrocrystal-monohydrate, (MACROBID) 100 MG capsule Take 1 capsule (100 mg total) by mouth 2 (two) times daily. 14 capsule 0     Assessment: Pharmacy consulted to dose warfarin in patient with atrial fibrillation.  Patient's INR on admission is 1.7 with last dose given 1/24 1830. Dose of 3mg  ordered 1/26 but not given. INR 1.9 today, approaching therapeutic  Home regimen lsted as 1.5 mg on Mon and Thurs and 3 mg ROW.  Goal of Therapy:  INR 2-3 Monitor platelets by  anticoagulation protocol: Yes   Plan:  Warfarin 3 mg x 1 today Monitor labs and s/s of bleeding  Isac Sarna, BS [place], BCPS Clinical Pharmacist Pager [phone] 12/24/2019,10:48 AM

## 2019-12-25 LAB — COMPREHENSIVE METABOLIC PANEL
ALT: 19 U/L (ref 0–44)
AST: 34 U/L (ref 15–41)
Albumin: 2.7 g/dL — ABNORMAL LOW (ref 3.5–5.0)
Alkaline Phosphatase: 58 U/L (ref 38–126)
Anion gap: 8 (ref 5–15)
BUN: 28 mg/dL — ABNORMAL HIGH (ref 8–23)
CO2: 24 mmol/L (ref 22–32)
Calcium: 8.3 mg/dL — ABNORMAL LOW (ref 8.9–10.3)
Chloride: 104 mmol/L (ref 98–111)
Creatinine, Ser: 0.84 mg/dL (ref 0.44–1.00)
GFR calc Af Amer: >60 mL/min (ref >60)
GFR calc non Af Amer: >60 mL/min (ref >60)
Glucose, Bld: 156 mg/dL — ABNORMAL HIGH (ref 70–99)
Potassium: 4.7 mmol/L (ref 3.5–5.1)
Sodium: 136 mmol/L (ref 135–145)
Total Bilirubin: 0.4 mg/dL (ref 0.3–1.2)
Total Protein: 6.2 g/dL — ABNORMAL LOW (ref 6.5–8.1)

## 2019-12-25 LAB — CBC WITH DIFFERENTIAL/PLATELET
Abs Immature Granulocytes: 0.01 10*3/uL (ref 0.00–0.07)
Basophils Absolute: 0 10*3/uL (ref 0.0–0.1)
Basophils Relative: 0 %
Eosinophils Absolute: 0 10*3/uL (ref 0.0–0.5)
Eosinophils Relative: 0 %
HCT: 46.7 % — ABNORMAL HIGH (ref 36.0–46.0)
Hemoglobin: 14.2 g/dL (ref 12.0–15.0)
Immature Granulocytes: 0 %
Lymphocytes Relative: 39 %
Lymphs Abs: 1.1 10*3/uL (ref 0.7–4.0)
MCH: 29.8 pg (ref 26.0–34.0)
MCHC: 30.4 g/dL (ref 30.0–36.0)
MCV: 98.1 fL (ref 80.0–100.0)
Monocytes Absolute: 0.2 10*3/uL (ref 0.1–1.0)
Monocytes Relative: 8 %
Neutro Abs: 1.4 10*3/uL — ABNORMAL LOW (ref 1.7–7.7)
Neutrophils Relative %: 53 %
Platelets: 124 10*3/uL — ABNORMAL LOW (ref 150–400)
RBC: 4.76 MIL/uL (ref 3.87–5.11)
RDW: 14.1 % (ref 11.5–15.5)
WBC: 2.7 10*3/uL — ABNORMAL LOW (ref 4.0–10.5)
nRBC: 0 % (ref 0.0–0.2)

## 2019-12-25 LAB — GLUCOSE, CAPILLARY
Glucose-Capillary: 125 mg/dL — ABNORMAL HIGH (ref 70–99)
Glucose-Capillary: 111 mg/dL — ABNORMAL HIGH (ref 70–99)
Glucose-Capillary: 125 mg/dL — ABNORMAL HIGH (ref 70–99)
Glucose-Capillary: 151 mg/dL — ABNORMAL HIGH (ref 70–99)

## 2019-12-25 LAB — D-DIMER, QUANTITATIVE: D-Dimer, Quant: 1.93 ug/mL-FEU — ABNORMAL HIGH (ref 0.00–0.50)

## 2019-12-25 LAB — C-REACTIVE PROTEIN: CRP: 1.3 mg/dL — ABNORMAL HIGH (ref <1.0)

## 2019-12-25 LAB — PROTIME-INR
INR: 2.7 — ABNORMAL HIGH (ref 0.8–1.2)
Prothrombin Time: 28.8 seconds — ABNORMAL HIGH (ref 11.4–15.2)

## 2019-12-25 LAB — MAGNESIUM: Magnesium: 2 mg/dL (ref 1.7–2.4)

## 2019-12-25 LAB — FERRITIN: Ferritin: 614 ng/mL — ABNORMAL HIGH (ref 11–307)

## 2019-12-25 LAB — PHOSPHORUS: Phosphorus: 2.2 mg/dL — ABNORMAL LOW (ref 2.5–4.6)

## 2019-12-25 MED ORDER — WARFARIN SODIUM 1 MG PO TABS
1.5000 mg | ORAL_TABLET | Freq: Once | ORAL | Status: AC
  Administered 2019-12-25: 1.5 mg via ORAL
  Filled 2019-12-25: qty 1

## 2019-12-25 NOTE — Care Management Important Message (Signed)
Important Message  Patient Details  Name: [name] MRN: [id number] Date of Birth: 06-Nov-1927   Medicare Important Message Given:  Yes - Important Message mailed due to current National Emergency   Verbal consent obtained due to current National Emergency  Relationship to patient: Child Contact Name: Antoria Lanza Call Date: 12/25/19  Time: 1406 Phone: [phone] Outcome: Spoke with contact Important Message mailed to: Other (must enter [address] [place], [place] [address])      Corey Harold 12/25/2019, 2:07 PM

## 2019-12-25 NOTE — Progress Notes (Signed)
ANTICOAGULATION CONSULT NOTE -  Pharmacy Consult for warfarin Indication: atrial fibrillation  Allergies  Allergen Reactions  . Contrast Media [Iodinated Diagnostic Agents] Other (See Comments)    "shaking"  . Penicillins Other (See Comments)    Has patient had a PCN reaction causing immediate rash, facial/tongue/throat swelling, SOB or lightheadedness with hypotension: Unknown Has patient had a PCN reaction causing severe rash involving mucus membranes or skin necrosis: Unknown Has patient had a PCN reaction that required hospitalization: Unknown Has patient had a PCN reaction occurring within the last 10 years: Unknown If all of the above answers are "NO", then may proceed with Cephalosporin use.  . Sulfa Antibiotics     Patient Measurements: Height: 5' (152.4 cm) Weight: 153 lb (69.4 kg) IBW/kg (Calculated) : 45.5   Vital Signs: Temp: 97.4 F (36.3 C) (01/27 0528) Temp Source: Oral (01/27 0528) BP: 132/62 (01/27 0528) Pulse Rate: 82 (01/27 0528)  Labs: Recent Labs    12/23/19 1315 12/23/19 1315 12/24/19 0557 12/25/19 1009  HGB 12.8   < > 12.7 14.2  HCT 42.2  --  42.4 46.7*  PLT 117*  --  106* 124*  LABPROT 19.9*  --  21.4* 28.8*  INR 1.7*  --  1.9* 2.7*  CREATININE 1.16*  --  0.85 0.84   < > = values in this interval not displayed.    Estimated Creatinine Clearance: 38.7 mL/min (by C-G formula based on SCr of 0.84 mg/dL).   Medical History: Past Medical History:  Diagnosis Date  . Acid reflux   . Atrial fibrillation (H[facility])   . Atrophic vaginitis   . Chronic cystitis   . Decubitus ulcer of buttock   . Dementia ([facility])   . Hyperlipidemia   . Hypertension   . Incomplete bladder emptying   . Microscopic hematuria   . Pacemaker   . Straining on urination   . Symptomatic bradycardia   . Urge incontinence of urine   . UTI (lower urinary tract infection)     Medications:  Medications Prior to Admission  Medication Sig Dispense Refill Last Dose  .  cephALEXin (KEFLEX) 250 MG capsule Take 250 mg by mouth at bedtime.   12/22/2019 at Unknown time  . furosemide (LASIX) 40 MG tablet Take 20 mg by mouth daily.    12/23/2019 at Unknown time  . guaifenesin (ROBITUSSIN) 100 MG/5ML syrup Take 200-400 mg by mouth 4 (four) times daily as needed for cough.    12/23/2019 at Unknown time  . loratadine (CLARITIN) 10 MG tablet Take 10 mg by mouth daily as needed for allergies.   12/23/2019 at Unknown time  . metoprolol tartrate (LOPRESSOR) 25 MG tablet Take 1 tablet each morning and 1/2 tablet each evening.  (okay to crush) (Patient taking differently: Take 12.5-25 mg by mouth See admin instructions. Take 1 tablet each morning and 1/2 tablet each evening.  (okay to crush)) 135 tablet 3 12/23/2019 at 1030  . Multiple Vitamins-Minerals (CENTRUM SILVER PO) Take 1 tablet by mouth daily.     12/23/2019 at Unknown time  . omeprazole (PRILOSEC) 20 MG capsule Take 20 mg by mouth every morning.    12/23/2019 at Unknown time  . potassium chloride SA (K-DUR,KLOR-CON) 20 MEQ tablet Take 20 mEq by mouth every morning.    12/23/2019 at Unknown time  . simvastatin (ZOCOR) 10 MG tablet Take 10 mg by mouth daily.   12/22/2019 at Unknown time  . Travoprost, BAK Free, (TRAVATAN Z) 0.004 % SOLN ophthalmic solution Place 1  drop into both eyes at bedtime.   12/22/2019 at Unknown time  . warfarin (COUMADIN) 3 MG tablet TAKE 1 TABLET DAILY EXCEPT TAKE 1/2 TABLET ON MONDAYS AND THURSDAYS OR AS DIRECTED. (Patient taking differently: Take 1.5-3 mg by mouth See admin instructions. TAKE 1 TABLET DAILY EXCEPT TAKE 1/2 TABLET ON MONDAYS AND THURSDAYS OR AS DIRECTED.) 90 tablet 1 12/22/2019 at 1830  . nitrofurantoin, macrocrystal-monohydrate, (MACROBID) 100 MG capsule Take 1 capsule (100 mg total) by mouth 2 (two) times daily. 14 capsule 0     Assessment: Pharmacy consulted to dose warfarin in patient with atrial fibrillation.  Patient's INR on admission is 1.7 with last dose given 1/24 1830. Dose of 3mg   ordered 1/26 but not given. INR 1.9>> 2.7, therapeutic but huge increase  Home regimen lsted as 1.5 mg on Mon and Thurs and 3 mg ROW.  Goal of Therapy:  INR 2-3 Monitor platelets by anticoagulation protocol: Yes   Plan:  Warfarin 1.5 mg x 1 today Monitor labs and s/s of bleeding  Isac Sarna, BS [place], BCPS Clinical Pharmacist Pager [phone] 12/25/2019,10:50 AM

## 2019-12-25 NOTE — Plan of Care (Signed)
  Problem: Clinical Measurements: Goal: Respiratory complications will improve Outcome: Progressing  Pt is maintaining O2 sat at 95% with no increase in work of breathing on room air.  Problem: Nutrition: Goal: Adequate nutrition will be maintained Outcome: Not Progressing  Pt will only take small bites of food and sips of water. She then does not want anymore. I attempted offering her multiple options but she did not seem interested in anything I offered her.

## 2019-12-25 NOTE — Progress Notes (Signed)
Patient Demographics:    [name], is a 86 y.o. female, DOB - 1927/08/23, WYO:[id number]  Admit date - 12/23/2019   Admitting Physician Viktoriya Glaspy Mariea Clonts, MD  Outpatient Primary MD for the patient is Avon Gully, MD  LOS - 2   Chief Complaint  Patient presents with  . Weakness  . Cough        Subjective:    [name] today has no fevers, no emesis,  No chest pain,  -Hypoxic overnight required oxygen -Oral intake is fair  Assessment  & Plan :    Principal Problem:   Acute respiratory disease due to COVID-19 virus Active Problems:   Atrial fibrillation [facility])   Pacemaker - Medtronic 2007, new generator 2014   Long term current use of anticoagulant therapy   Dementia without behavioral disturbance (HCC)   Acute lower UTI--- H/o E coli ESBL  Brief Summary:- 86 y.o. female with past medical history relevant for atrial fibrillation with status post prior pacemaker placement and history of prior ESBL E. coli UTI   A/p 1)Acute hypoxic respiratory failure secondary to COVID-19 infection/pneumonia-- --Patient is positive for COVID-19 infection, chest x-ray with findings of infiltrates/opacities, - -hypoxia improving, continues to require oxygen overnight - c/n  Remdesivir AND  Steroid therapy per protocol -started on 12/23/19---logistically difficult for patient to do outpatient infusion of remdesivir due to dementia with significant cognitive deficits may have to stay in the hospital to complete 5 days of IV remdesivir -Continue to trend nflammatory markers- --Supplemental oxygen to keep O2 sats above 93% -Follow serial chest x-rays and ABGs as indicated --- Encourage prone positioning for More than 16 hours/day in increments of 2 to 3 hours at a time if able to tolerate --Attempt to maintain euvolemic state --Zinc and vitamin C as ordered -Albuterol inhaler as needed  2)Presumed UTI--- H/o  Prior ESBL E. coli UTI--treat empirically with IV Meropenem pending culture results  3) chronic atrial fibrillation/S/p PPM-- coumadin and metoprolol as ordered, INR is up to 2.7 from 1.9  4) social/ethics-- patient is a DNR  5) advanced dementia--- patient with significant cognitive difficulties, no significant agitation --Continue routine cares  Disposition/Need for in-Hospital Stay- patient unable to be discharged at this time due to --Covid pneumonia with hypoxia requiring IV steroid and IV remdesivir supplemental oxygen -- c/n  Remdesivir AND  Steroid therapy per protocol -started on 12/23/19---logistically difficult for patient to do outpatient infusion of remdesivir due to dementia with significant cognitive deficits may have to stay in the hospital to complete 5 days of IV remdesivir  Code Status : DNR  Family Communication:    -Called and discussed with patient's daughter  Disposition Plan  : Possible discharge home with home health when clinically improved -most likely after completion of 5 days of IV remdesivir in the hospital  Consults  :  na  DVT Prophylaxis  : Coumadin- SCDs *  Lab Results  Component Value Date   PLT 124 (L) 12/25/2019    Inpatient Medications  Scheduled Meds: . albuterol  2 puff Inhalation TID  . vitamin C  500 mg Oral Daily  . dexamethasone (DECADRON) injection  6 mg Intravenous Q24H  . folic acid  1 mg Oral Daily  . guaiFENesin  600  mg Oral BID  . latanoprost  1 drop Both Eyes QHS  . metoprolol tartrate  12.5 mg Oral QHS  . metoprolol tartrate  25 mg Oral Daily  . multivitamin with minerals  1 tablet Oral Daily  . pantoprazole  40 mg Oral Daily  . QUEtiapine  25 mg Oral QHS  . simvastatin  10 mg Oral Daily  . sodium chloride flush  3 mL Intravenous Q12H  . thiamine  100 mg Oral Daily  . Warfarin - Pharmacist Dosing Inpatient   Does not apply q1800  . zinc sulfate  220 mg Oral Daily   Continuous Infusions: . sodium chloride Stopped  (12/25/19 0555)  . dextrose 5 % and 0.45% NaCl 50 mL/hr at 12/25/19 1019  . meropenem (MERREM) IV Stopped (12/25/19 1222)  . remdesivir 100 mg in NS 100 mL Stopped (12/25/19 1100)   PRN Meds:.sodium chloride, acetaminophen **OR** acetaminophen, chlorpheniramine-HYDROcodone, guaiFENesin-dextromethorphan, ondansetron **OR** ondansetron (ZOFRAN) IV, polyethylene glycol, sodium chloride flush, traZODone    Anti-infectives (From admission, onward)   Start     Dose/Rate Route Frequency Ordered Stop   12/24/19 1500  cefTRIAXone (ROCEPHIN) 1 g in sodium chloride 0.9 % 100 mL IVPB  Status:  Discontinued     1 g 200 mL/hr over 30 Minutes Intravenous Every 24 hours 12/23/19 1507 12/23/19 1727   12/24/19 1000  remdesivir 100 mg in sodium chloride 0.9 % 100 mL IVPB     100 mg 200 mL/hr over 30 Minutes Intravenous Daily 12/23/19 1457 12/28/19 0959   12/23/19 2200  meropenem (MERREM) 1 g in sodium chloride 0.9 % 100 mL IVPB     1 g 200 mL/hr over 30 Minutes Intravenous Every 12 hours 12/23/19 1727     12/23/19 1530  remdesivir 200 mg in sodium chloride 0.9% 250 mL IVPB     200 mg 580 mL/hr over 30 Minutes Intravenous Once 12/23/19 1457 12/23/19 1830   12/23/19 1500  cefTRIAXone (ROCEPHIN) 1 g in sodium chloride 0.9 % 100 mL IVPB     1 g 200 mL/hr over 30 Minutes Intravenous  Once 12/23/19 1434 12/23/19 1650        Objective:   Vitals:   12/24/19 2143 12/25/19 0528 12/25/19 0755 12/25/19 1333  BP: (!) 104/43 132/62  138/60  Pulse: (!) 50 82  84  Resp: 20 18  19   Temp: 98.2 F (36.8 C) (!) 97.4 F (36.3 C)  97.6 F (36.4 C)  TempSrc: Oral Oral  Axillary  SpO2: 95% 98% 96% 95%  Weight:      Height:        Wt Readings from Last 3 Encounters:  12/23/19 69.4 kg  01/01/19 69.7 kg  12/29/18 71.7 kg     Intake/Output Summary (Last 24 hours) at 12/25/2019 1936 Last data filed at 12/25/2019 1834 Gross per 24 hour  Intake 1677.87 ml  Output 325 ml  Net 1352.87 ml     Physical  Exam  Gen:- Awake Alert, resting HEENT:- [place].AT, No sclera icterus Nose- [place] 2 L/min Neck-Supple Neck,No JVD,.  Lungs-diminished in bases, scant wheezing CV- S1, S2 normal, regular  Abd-  +ve B.Sounds, Abd Soft, No tenderness,    Extremity/Skin:- No  edema, pedal pulses present  Psych- pleasantly confused, disoriented, at baseline patient has severe/significant cognitive and memory deficits Neuro-generalized weakness, no new focal deficits, no tremors   Data Review:   Micro Results Recent Results (from the past 240 hour(s))  Blood Culture (routine x 2)  Status: None (Preliminary result)   Collection Time: 12/23/19  1:05 PM   Specimen: Right Antecubital; Blood  Result Value Ref Range Status   Specimen Description RIGHT ANTECUBITAL  Final   Special Requests   Final    BOTTLES DRAWN AEROBIC ONLY Blood Culture results may not be optimal due to an inadequate volume of blood received in culture bottles   Culture   Final    NO GROWTH 2 DAYS Performed at [facility], [address], [place], [place] [address]    Report Status PENDING  Incomplete  Blood Culture (routine x 2)     Status: None (Preliminary result)   Collection Time: 12/23/19  1:20 PM   Specimen: Left Antecubital; Blood  Result Value Ref Range Status   Specimen Description LEFT ANTECUBITAL  Final   Special Requests   Final    BOTTLES DRAWN AEROBIC AND ANAEROBIC Blood Culture adequate volume   Culture   Final    NO GROWTH 2 DAYS Performed at [facility], [address], [place], [place] [address]    Report Status PENDING  Incomplete    Radiology Reports DG Chest Portable 1 View  Result Date: 12/23/2019 CLINICAL DATA:  Cough. EXAM: PORTABLE CHEST 1 VIEW COMPARISON:  11/29/2018 and 11/24/2018 FINDINGS: Chronic cardiomegaly with extensive aortic atherosclerosis. Pulmonary vascularity is within normal limits. Slight new atelectasis at the left base. Single tiny area of atelectasis at the right base. Pacemaker in place.  Old compression fracture in the lower thoracic spine. IMPRESSION: New slight atelectasis at the left base. Minimal new atelectasis at the right base. Chronic cardiomegaly.  Aortic Atherosclerosis (ICD10-I70.0). Electronically Signed   By: Francene Boyers M.D.   On: 12/23/2019 13:12     CBC Recent Labs  Lab 12/23/19 1315 12/24/19 0557 12/25/19 1009  WBC 3.6* 2.5* 2.7*  HGB 12.8 12.7 14.2  HCT 42.2 42.4 46.7*  PLT 117* 106* 124*  MCV 98.4 99.3 98.1  MCH 29.8 29.7 29.8  MCHC 30.3 30.0 30.4  RDW 14.7 14.6 14.1  LYMPHSABS 0.8 0.8 1.1  MONOABS 0.3 0.1 0.2  EOSABS 0.0 0.0 0.0  BASOSABS 0.0 0.0 0.0    Chemistries  Recent Labs  Lab 12/23/19 1315 12/24/19 0557 12/25/19 1009  NA 135 139 136  K 4.5 4.6 4.7  CL 104 108 104  CO2 24 24 24   GLUCOSE 151* 159* 156*  BUN 29* 28* 28*  CREATININE 1.16* 0.85 0.84  CALCIUM 8.0* 8.0* 8.3*  MG  --  1.9 2.0  AST 41 34 34  ALT 18 15 19   ALKPHOS 60 55 58  BILITOT 0.5 0.5 0.4   ------------------------------------------------------------------------------------------------------------------ Recent Labs    12/23/19 1316  TRIG 94    No results found for: HGBA1C ------------------------------------------------------------------------------------------------------------------ No results for input(s): TSH, T4TOTAL, T3FREE, THYROIDAB in the last 72 hours.  Invalid input(s): FREET3 ------------------------------------------------------------------------------------------------------------------ Recent Labs    12/24/19 0557 12/25/19 1009  FERRITIN 287 614*    Coagulation profile Recent Labs  Lab 12/23/19 1315 12/24/19 0557 12/25/19 1009  INR 1.7* 1.9* 2.7*    Recent Labs    12/24/19 0557 12/25/19 1009  DDIMER 2.05* 1.93*    Cardiac Enzymes No results for input(s): CKMB, TROPONINI, MYOGLOBIN in the last 168 hours.  Invalid input(s):  CK ------------------------------------------------------------------------------------------------------------------ No results found for: BNP   12/26/19 M.D on 12/25/2019 at 7:36 PM  Go to [email or web address] - for contact info  [facility] - Office  [phone]

## 2019-12-26 LAB — URINE CULTURE
Culture: <10000 — AB
Special Requests: NORMAL

## 2019-12-26 LAB — CBC WITH DIFFERENTIAL/PLATELET
Abs Immature Granulocytes: 0.01 10*3/uL (ref 0.00–0.07)
Basophils Absolute: 0 10*3/uL (ref 0.0–0.1)
Basophils Relative: 0 %
Eosinophils Absolute: 0 10*3/uL (ref 0.0–0.5)
Eosinophils Relative: 0 %
HCT: 48.8 % — ABNORMAL HIGH (ref 36.0–46.0)
Hemoglobin: 14.5 g/dL (ref 12.0–15.0)
Immature Granulocytes: 0 %
Lymphocytes Relative: 36 %
Lymphs Abs: 1.2 10*3/uL (ref 0.7–4.0)
MCH: 29.8 pg (ref 26.0–34.0)
MCHC: 29.7 g/dL — ABNORMAL LOW (ref 30.0–36.0)
MCV: 100.2 fL — ABNORMAL HIGH (ref 80.0–100.0)
Monocytes Absolute: 0.4 10*3/uL (ref 0.1–1.0)
Monocytes Relative: 13 %
Neutro Abs: 1.7 10*3/uL (ref 1.7–7.7)
Neutrophils Relative %: 51 %
Platelets: 131 10*3/uL — ABNORMAL LOW (ref 150–400)
RBC: 4.87 MIL/uL (ref 3.87–5.11)
RDW: 14 % (ref 11.5–15.5)
WBC: 3.4 10*3/uL — ABNORMAL LOW (ref 4.0–10.5)
nRBC: 0 % (ref 0.0–0.2)

## 2019-12-26 LAB — C-REACTIVE PROTEIN: CRP: 1.2 mg/dL — ABNORMAL HIGH (ref <1.0)

## 2019-12-26 LAB — COMPREHENSIVE METABOLIC PANEL
ALT: 18 U/L (ref 0–44)
AST: 37 U/L (ref 15–41)
Albumin: 2.4 g/dL — ABNORMAL LOW (ref 3.5–5.0)
Alkaline Phosphatase: 51 U/L (ref 38–126)
Anion gap: 7 (ref 5–15)
BUN: 24 mg/dL — ABNORMAL HIGH (ref 8–23)
CO2: 22 mmol/L (ref 22–32)
Calcium: 8 mg/dL — ABNORMAL LOW (ref 8.9–10.3)
Chloride: 105 mmol/L (ref 98–111)
Creatinine, Ser: 0.74 mg/dL (ref 0.44–1.00)
GFR calc Af Amer: >60 mL/min (ref >60)
GFR calc non Af Amer: >60 mL/min (ref >60)
Glucose, Bld: 139 mg/dL — ABNORMAL HIGH (ref 70–99)
Potassium: 5.4 mmol/L — ABNORMAL HIGH (ref 3.5–5.1)
Sodium: 134 mmol/L — ABNORMAL LOW (ref 135–145)
Total Bilirubin: 1.2 mg/dL (ref 0.3–1.2)
Total Protein: 5.6 g/dL — ABNORMAL LOW (ref 6.5–8.1)

## 2019-12-26 LAB — GLUCOSE, CAPILLARY
Glucose-Capillary: 114 mg/dL — ABNORMAL HIGH (ref 70–99)
Glucose-Capillary: 106 mg/dL — ABNORMAL HIGH (ref 70–99)
Glucose-Capillary: 89 mg/dL (ref 70–99)
Glucose-Capillary: 146 mg/dL — ABNORMAL HIGH (ref 70–99)

## 2019-12-26 LAB — PHOSPHORUS: Phosphorus: 2.1 mg/dL — ABNORMAL LOW (ref 2.5–4.6)

## 2019-12-26 LAB — PROTIME-INR
INR: 2.4 — ABNORMAL HIGH (ref 0.8–1.2)
Prothrombin Time: 26 seconds — ABNORMAL HIGH (ref 11.4–15.2)

## 2019-12-26 LAB — MAGNESIUM: Magnesium: 1.9 mg/dL (ref 1.7–2.4)

## 2019-12-26 LAB — FERRITIN: Ferritin: 683 ng/mL — ABNORMAL HIGH (ref 11–307)

## 2019-12-26 MED ORDER — SODIUM ZIRCONIUM CYCLOSILICATE 5 G PO PACK
5.0000 g | PACK | Freq: Once | ORAL | Status: AC
  Administered 2019-12-26: 5 g via ORAL
  Filled 2019-12-26: qty 1

## 2019-12-26 MED ORDER — FOSFOMYCIN TROMETHAMINE 3 G PO PACK
3.0000 g | PACK | Freq: Once | ORAL | Status: AC
  Administered 2019-12-26: 3 g via ORAL
  Filled 2019-12-26: qty 3

## 2019-12-26 MED ORDER — WARFARIN SODIUM 1 MG PO TABS
1.5000 mg | ORAL_TABLET | Freq: Once | ORAL | Status: AC
  Administered 2019-12-26: 1.5 mg via ORAL
  Filled 2019-12-26: qty 2
  Filled 2019-12-26: qty 1.5

## 2019-12-26 NOTE — Progress Notes (Signed)
ANTICOAGULATION CONSULT NOTE -  Pharmacy Consult for warfarin Indication: atrial fibrillation  Allergies  Allergen Reactions  . Contrast Media [Iodinated Diagnostic Agents] Other (See Comments)    "shaking"  . Penicillins Other (See Comments)    Has patient had a PCN reaction causing immediate rash, facial/tongue/throat swelling, SOB or lightheadedness with hypotension: Unknown Has patient had a PCN reaction causing severe rash involving mucus membranes or skin necrosis: Unknown Has patient had a PCN reaction that required hospitalization: Unknown Has patient had a PCN reaction occurring within the last 10 years: Unknown If all of the above answers are "NO", then may proceed with Cephalosporin use.  . Sulfa Antibiotics     Patient Measurements: Height: 5' (152.4 cm) Weight: 153 lb (69.4 kg) IBW/kg (Calculated) : 45.5   Vital Signs: Temp: 97.7 F (36.5 C) (01/28 0441) Temp Source: Oral (01/28 0441) BP: 146/69 (01/28 0441) Pulse Rate: 74 (01/28 1011)  Labs: Recent Labs    12/24/19 0557 12/24/19 0557 12/25/19 1009 12/26/19 0723 12/26/19 1503  HGB 12.7   < > 14.2 14.5  --   HCT 42.4  --  46.7* 48.8*  --   PLT 106*  --  124* 131*  --   LABPROT 21.4*  --  28.8*  --  26.0*  INR 1.9*  --  2.7*  --  2.4*  CREATININE 0.85  --  0.84 0.74  --    < > = values in this interval not displayed.    Estimated Creatinine Clearance: 40.7 mL/min (by C-G formula based on SCr of 0.74 mg/dL).   Medical History: Past Medical History:  Diagnosis Date  . Acid reflux   . Atrial fibrillation ([place])   . Atrophic vaginitis   . Chronic cystitis   . Decubitus ulcer of buttock   . Dementia ([place])   . Hyperlipidemia   . Hypertension   . Incomplete bladder emptying   . Microscopic hematuria   . Pacemaker   . Straining on urination   . Symptomatic bradycardia   . Urge incontinence of urine   . UTI (lower urinary tract infection)     Medications:  Medications Prior to Admission   Medication Sig Dispense Refill Last Dose  . cephALEXin (KEFLEX) 250 MG capsule Take 250 mg by mouth at bedtime.   12/22/2019 at Unknown time  . furosemide (LASIX) 40 MG tablet Take 20 mg by mouth daily.    12/23/2019 at Unknown time  . guaifenesin (ROBITUSSIN) 100 MG/5ML syrup Take 200-400 mg by mouth 4 (four) times daily as needed for cough.    12/23/2019 at Unknown time  . loratadine (CLARITIN) 10 MG tablet Take 10 mg by mouth daily as needed for allergies.   12/23/2019 at Unknown time  . metoprolol tartrate (LOPRESSOR) 25 MG tablet Take 1 tablet each morning and 1/2 tablet each evening.  (okay to crush) (Patient taking differently: Take 12.5-25 mg by mouth See admin instructions. Take 1 tablet each morning and 1/2 tablet each evening.  (okay to crush)) 135 tablet 3 12/23/2019 at 1030  . Multiple Vitamins-Minerals (CENTRUM SILVER PO) Take 1 tablet by mouth daily.     12/23/2019 at Unknown time  . omeprazole (PRILOSEC) 20 MG capsule Take 20 mg by mouth every morning.    12/23/2019 at Unknown time  . potassium chloride SA (K-DUR,KLOR-CON) 20 MEQ tablet Take 20 mEq by mouth every morning.    12/23/2019 at Unknown time  . simvastatin (ZOCOR) 10 MG tablet Take 10 mg by mouth daily.  12/22/2019 at Unknown time  . Travoprost, BAK Free, (TRAVATAN Z) 0.004 % SOLN ophthalmic solution Place 1 drop into both eyes at bedtime.   12/22/2019 at Unknown time  . warfarin (COUMADIN) 3 MG tablet TAKE 1 TABLET DAILY EXCEPT TAKE 1/2 TABLET ON MONDAYS AND THURSDAYS OR AS DIRECTED. (Patient taking differently: Take 1.5-3 mg by mouth See admin instructions. TAKE 1 TABLET DAILY EXCEPT TAKE 1/2 TABLET ON MONDAYS AND THURSDAYS OR AS DIRECTED.) 90 tablet 1 12/22/2019 at 1830  . nitrofurantoin, macrocrystal-monohydrate, (MACROBID) 100 MG capsule Take 1 capsule (100 mg total) by mouth 2 (two) times daily. 14 capsule 0     Assessment: Pharmacy consulted to dose warfarin in patient with atrial fibrillation.  Patient's INR on admission is  1.7 with last dose given 1/24 1830. Dose of 3mg  ordered 1/26 but not given. INR 1.9>> 2.7>2.4  Home regimen lsted as 1.5 mg on Mon and Thurs and 3 mg ROW.  Goal of Therapy:  INR 2-3 Monitor platelets by anticoagulation protocol: Yes   Plan:  Warfarin 1.5 mg x 1 today Monitor labs and s/s of bleeding  Fri, PharmD Clinical Pharmacist 12/26/2019 3:47 PM

## 2019-12-26 NOTE — Progress Notes (Signed)
Patient Demographics:    [name], is a 86 y.o. female, DOB - 12/09/1926, HMC:[id number]  Admit date - 12/23/2019   Admitting Physician Zenna Traister Denton Brick, MD  Outpatient Primary MD for the patient is [name], MD  LOS - 3   Chief Complaint  Patient presents with  . Weakness  . Cough        Subjective:    [name] today has no fevers, no emesis,  No chest pain,  - Oral intake is poor -Voiding fair  Assessment  & Plan :    Principal Problem:   Acute respiratory disease due to COVID-19 virus Active Problems:   Atrial fibrillation [facility])   Pacemaker - Medtronic 2007, new generator 2014   Long term current use of anticoagulant therapy   Dementia without behavioral disturbance ([place])   Acute lower UTI--- H/o E coli ESBL  Brief Summary:- 86 y.o. female with past medical history relevant for atrial fibrillation with status post prior pacemaker placement and history of prior ESBL E. coli UTI admitted on 12/22/2018 with COVID-19 pneumonia poor oral intake  A/p 1)Acute hypoxic respiratory failure secondary to COVID-19 infection/pneumonia-- --Patient is positive for COVID-19 infection, chest x-ray with findings of infiltrates/opacities, - - c/n  Remdesivir AND  Steroid therapy per protocol -started on 12/23/19---logistically difficult for patient to do outpatient infusion of remdesivir due to dementia with significant cognitive deficits may have to stay in the hospital to complete 5 days of IV remdesivir -Continue to trend nflammatory markers- --Supplemental oxygen to keep O2 sats above 93% -Follow serial chest x-rays and ABGs as indicated --- Encourage prone positioning for More than 16 hours/day in increments of 2 to 3 hours at a time if able to tolerate --Attempt to maintain euvolemic state --Zinc and vitamin C as ordered -Albuterol inhaler as needed -Hypoxia has mostly resolved, continues  to require oxygen at times when she falls asleep  2)Presumed UTI--- H/o Prior ESBL E. coli UTI--treated empirically with IV Meropenem , cultures were obtained after patient has been on antibiotics for over 24 hours- Okay to stop meropenem and treat with fosfomycin 3 g x 1 dose  3) chronic atrial fibrillation/S/p PPM-- coumadin and metoprolol as ordered, INR is therapeutic at 2.4  4) social/ethics-- patient is a DNR  5) advanced dementia--- patient with significant cognitive difficulties, no significant agitation --Continue routine cares  6)FEN/Hyperkalemia-- -Oral intake remains challenging, give Lokelma for hyperkalemia -Continue D5 half-normal solution -Monitor urine output closely  Disposition/Need for in-Hospital Stay- patient unable to be discharged at this time due to --Covid pneumonia with hypoxia requiring IV steroid and IV remdesivir supplemental oxygen -- c/n  Remdesivir AND  Steroid therapy per protocol -started on 12/23/19---logistically difficult for patient to do outpatient infusion of remdesivir due to dementia with significant cognitive deficits may have to stay in the hospital to complete 5 days of IV remdesivir  Code Status : DNR  Family Communication:    -Called and discussed with patient's daughter  Disposition Plan  : Possible discharge home with home health when clinically improved -most likely after completion of 5 days of IV remdesivir in the hospital  Consults  :  na  DVT Prophylaxis  : Coumadin- SCDs *  Lab Results  Component Value Date  PLT 131 (L) 12/26/2019    Inpatient Medications  Scheduled Meds: . albuterol  2 puff Inhalation TID  . vitamin C  500 mg Oral Daily  . dexamethasone (DECADRON) injection  6 mg Intravenous Q24H  . folic acid  1 mg Oral Daily  . guaiFENesin  600 mg Oral BID  . latanoprost  1 drop Both Eyes QHS  . metoprolol tartrate  12.5 mg Oral QHS  . metoprolol tartrate  25 mg Oral Daily  . multivitamin with minerals  1  tablet Oral Daily  . pantoprazole  40 mg Oral Daily  . QUEtiapine  25 mg Oral QHS  . simvastatin  10 mg Oral Daily  . sodium chloride flush  3 mL Intravenous Q12H  . sodium zirconium cyclosilicate  5 g Oral Once  . thiamine  100 mg Oral Daily  . Warfarin - Pharmacist Dosing Inpatient   Does not apply q1800  . zinc sulfate  220 mg Oral Daily   Continuous Infusions: . sodium chloride Stopped (12/25/19 0555)  . dextrose 5 % and 0.45% NaCl 75 mL/hr at 12/26/19 1818  . remdesivir 100 mg in NS 100 mL 100 mg (12/26/19 1100)   PRN Meds:.sodium chloride, acetaminophen **OR** acetaminophen, chlorpheniramine-HYDROcodone, guaiFENesin-dextromethorphan, ondansetron **OR** ondansetron (ZOFRAN) IV, polyethylene glycol, sodium chloride flush, traZODone    Anti-infectives (From admission, onward)   Start     Dose/Rate Route Frequency Ordered Stop   12/26/19 1800  fosfomycin (MONUROL) packet 3 g     3 g Oral  Once 12/26/19 1016 12/26/19 1613   12/24/19 1500  cefTRIAXone (ROCEPHIN) 1 g in sodium chloride 0.9 % 100 mL IVPB  Status:  Discontinued     1 g 200 mL/hr over 30 Minutes Intravenous Every 24 hours 12/23/19 1507 12/23/19 1727   12/24/19 1000  remdesivir 100 mg in sodium chloride 0.9 % 100 mL IVPB     100 mg 200 mL/hr over 30 Minutes Intravenous Daily 12/23/19 1457 12/28/19 0959   12/23/19 2200  meropenem (MERREM) 1 g in sodium chloride 0.9 % 100 mL IVPB  Status:  Discontinued     1 g 200 mL/hr over 30 Minutes Intravenous Every 12 hours 12/23/19 1727 12/26/19 1016   12/23/19 1530  remdesivir 200 mg in sodium chloride 0.9% 250 mL IVPB     200 mg 580 mL/hr over 30 Minutes Intravenous Once 12/23/19 1457 12/23/19 1830   12/23/19 1500  cefTRIAXone (ROCEPHIN) 1 g in sodium chloride 0.9 % 100 mL IVPB     1 g 200 mL/hr over 30 Minutes Intravenous  Once 12/23/19 1434 12/23/19 1650        Objective:   Vitals:   12/25/19 2135 12/26/19 0441 12/26/19 0858 12/26/19 1011  BP: (!) 154/53 (!) 146/69     Pulse: 63 63  74  Resp: 20 20    Temp: 97.6 F (36.4 C) 97.7 F (36.5 C)    TempSrc: Oral Oral    SpO2: 95% 100% 98%   Weight:      Height:        Wt Readings from Last 3 Encounters:  12/23/19 69.4 kg  01/01/19 69.7 kg  12/29/18 71.7 kg     Intake/Output Summary (Last 24 hours) at 12/26/2019 1819 Last data filed at 12/26/2019 1700 Gross per 24 hour  Intake 600 ml  Output 900 ml  Net -300 ml     Physical Exam  Gen:- Awake Alert, resting HEENT:- [place].AT, No sclera icterus Nose- [place] 2 L/min at  hs Neck-Supple Neck,No JVD,.  Lungs-diminished in bases, no wheezing  CV- S1, S2 normal, regular  Abd-  +ve B.Sounds, Abd Soft, No tenderness,    Extremity/Skin:- No  edema, pedal pulses present  Psych- pleasantly confused, disoriented, at baseline patient has severe/significant cognitive and memory deficits Neuro-generalized weakness, no new focal deficits, no tremors   Data Review:   Micro Results Recent Results (from the past 240 hour(s))  Blood Culture (routine x 2)     Status: None (Preliminary result)   Collection Time: 12/23/19  1:05 PM   Specimen: Right Antecubital; Blood  Result Value Ref Range Status   Specimen Description RIGHT ANTECUBITAL  Final   Special Requests   Final    BOTTLES DRAWN AEROBIC ONLY Blood Culture results may not be optimal due to an inadequate volume of blood received in culture bottles   Culture   Final    NO GROWTH 3 DAYS Performed at [facility], [address], [place], [place] [address]    Report Status PENDING  Incomplete  Blood Culture (routine x 2)     Status: None (Preliminary result)   Collection Time: 12/23/19  1:20 PM   Specimen: Left Antecubital; Blood  Result Value Ref Range Status   Specimen Description LEFT ANTECUBITAL  Final   Special Requests   Final    BOTTLES DRAWN AEROBIC AND ANAEROBIC Blood Culture adequate volume   Culture   Final    NO GROWTH 3 DAYS Performed at [facility], [address], [place], [place]  [address]    Report Status PENDING  Incomplete  Urine Culture     Status: Abnormal   Collection Time: 12/25/19 12:45 AM   Specimen: Urine, Clean Catch  Result Value Ref Range Status   Specimen Description   Final    URINE, CLEAN CATCH Performed at [facility], [address] [place], [place] [address]    Special Requests   Final    Normal Performed at [facility], [address] [place], [place] [address]    Culture (A)  Final    <10,000 COLONIES/mL INSIGNIFICANT GROWTH Performed at [facility], [address] [place], [place] [address]    Report Status 12/26/2019 FINAL  Final    Radiology Reports DG Chest Portable 1 View  Result Date: 12/23/2019 CLINICAL DATA:  Cough. EXAM: PORTABLE CHEST 1 VIEW COMPARISON:  11/29/2018 and 11/24/2018 FINDINGS: Chronic cardiomegaly with extensive aortic atherosclerosis. Pulmonary vascularity is within normal limits. Slight new atelectasis at the left base. Single tiny area of atelectasis at the right base. Pacemaker in place. Old compression fracture in the lower thoracic spine. IMPRESSION: New slight atelectasis at the left base. Minimal new atelectasis at the right base. Chronic cardiomegaly.  Aortic Atherosclerosis (ICD10-I70.0). Electronically Signed   By: Francene Boyers M.D.   On: 12/23/2019 13:12     CBC Recent Labs  Lab 12/23/19 1315 12/24/19 0557 12/25/19 1009 12/26/19 0723  WBC 3.6* 2.5* 2.7* 3.4*  HGB 12.8 12.7 14.2 14.5  HCT 42.2 42.4 46.7* 48.8*  PLT 117* 106* 124* 131*  MCV 98.4 99.3 98.1 100.2*  MCH 29.8 29.7 29.8 29.8  MCHC 30.3 30.0 30.4 29.7*  RDW 14.7 14.6 14.1 14.0  LYMPHSABS 0.8 0.8 1.1 1.2  MONOABS 0.3 0.1 0.2 0.4  EOSABS 0.0 0.0 0.0 0.0  BASOSABS 0.0 0.0 0.0 0.0    Chemistries  Recent Labs  Lab 12/23/19 1315 12/24/19 0557 12/25/19 1009 12/26/19 0723  NA 135 139 136 134*  K  4.5 4.6 4.7 5.4*  CL 104 108 104 105  CO2 24 24 24 22   GLUCOSE 151* 159* 156* 139*  BUN 29* 28* 28* 24*  CREATININE 1.16*  0.85 0.84 0.74  CALCIUM 8.0* 8.0* 8.3* 8.0*  MG  --  1.9 2.0 1.9  AST 41 34 34 37  ALT 18 15 19 18   ALKPHOS 60 55 58 51  BILITOT 0.5 0.5 0.4 1.2   ------------------------------------------------------------------------------------------------------------------ No results for input(s): CHOL, HDL, LDLCALC, TRIG, CHOLHDL, LDLDIRECT in the last 72 hours.  No results found for: HGBA1C ------------------------------------------------------------------------------------------------------------------ No results for input(s): TSH, T4TOTAL, T3FREE, THYROIDAB in the last 72 hours.  Invalid input(s): FREET3 ------------------------------------------------------------------------------------------------------------------ Recent Labs    12/25/19 1009 12/26/19 0723  FERRITIN 614* 683*    Coagulation profile Recent Labs  Lab 12/23/19 1315 12/24/19 0557 12/25/19 1009 12/26/19 1503  INR 1.7* 1.9* 2.7* 2.4*    Recent Labs    12/24/19 0557 12/25/19 1009  DDIMER 2.05* 1.93*    Cardiac Enzymes No results for input(s): CKMB, TROPONINI, MYOGLOBIN in the last 168 hours.  Invalid input(s): CK ------------------------------------------------------------------------------------------------------------------ No results found for: BNP   12/26/19 M.D on 12/26/2019 at 6:19 PM  Go to [email or web address] - for contact info  [facility] - Office  [phone]

## 2019-12-27 LAB — BASIC METABOLIC PANEL
Anion gap: 8 (ref 5–15)
BUN: 21 mg/dL (ref 8–23)
CO2: 25 mmol/L (ref 22–32)
Calcium: 7.9 mg/dL — ABNORMAL LOW (ref 8.9–10.3)
Chloride: 102 mmol/L (ref 98–111)
Creatinine, Ser: 0.6 mg/dL (ref 0.44–1.00)
GFR calc Af Amer: >60 mL/min (ref >60)
GFR calc non Af Amer: >60 mL/min (ref >60)
Glucose, Bld: 166 mg/dL — ABNORMAL HIGH (ref 70–99)
Potassium: 4 mmol/L (ref 3.5–5.1)
Sodium: 135 mmol/L (ref 135–145)

## 2019-12-27 LAB — GLUCOSE, CAPILLARY
Glucose-Capillary: 132 mg/dL — ABNORMAL HIGH (ref 70–99)
Glucose-Capillary: 151 mg/dL — ABNORMAL HIGH (ref 70–99)
Glucose-Capillary: 122 mg/dL — ABNORMAL HIGH (ref 70–99)
Glucose-Capillary: 130 mg/dL — ABNORMAL HIGH (ref 70–99)

## 2019-12-27 LAB — PROTIME-INR
INR: 2.7 — ABNORMAL HIGH (ref 0.8–1.2)
Prothrombin Time: 28.7 seconds — ABNORMAL HIGH (ref 11.4–15.2)

## 2019-12-27 MED ORDER — WARFARIN SODIUM 1 MG PO TABS
1.5000 mg | ORAL_TABLET | Freq: Once | ORAL | Status: AC
  Administered 2019-12-27: 1.5 mg via ORAL
  Filled 2019-12-27: qty 2
  Filled 2019-12-27: qty 1.5

## 2019-12-27 NOTE — Progress Notes (Signed)
ANTICOAGULATION CONSULT NOTE -  Pharmacy Consult for warfarin Indication: atrial fibrillation  Allergies  Allergen Reactions  . Contrast Media [Iodinated Diagnostic Agents] Other (See Comments)    "shaking"  . Penicillins Other (See Comments)    Has patient had a PCN reaction causing immediate rash, facial/tongue/throat swelling, SOB or lightheadedness with hypotension: Unknown Has patient had a PCN reaction causing severe rash involving mucus membranes or skin necrosis: Unknown Has patient had a PCN reaction that required hospitalization: Unknown Has patient had a PCN reaction occurring within the last 10 years: Unknown If all of the above answers are "NO", then may proceed with Cephalosporin use.  . Sulfa Antibiotics     Patient Measurements: Height: 5' (152.4 cm) Weight: 153 lb (69.4 kg) IBW/kg (Calculated) : 45.5   Vital Signs: Temp: 97.5 F (36.4 C) (01/29 0300) Temp Source: Axillary (01/29 0300) BP: 156/82 (01/29 0300) Pulse Rate: 105 (01/29 0300)  Labs: Recent Labs    12/25/19 1009 12/26/19 0723 12/26/19 1503 12/27/19 0622  HGB 14.2 14.5  --   --   HCT 46.7* 48.8*  --   --   PLT 124* 131*  --   --   LABPROT 28.8*  --  26.0* 28.7*  INR 2.7*  --  2.4* 2.7*  CREATININE 0.84 0.74  --  0.60    Estimated Creatinine Clearance: 40.7 mL/min (by C-G formula based on SCr of 0.6 mg/dL).   Medical History: Past Medical History:  Diagnosis Date  . Acid reflux   . Atrial fibrillation (HCC)   . Atrophic vaginitis   . Chronic cystitis   . Decubitus ulcer of buttock   . Dementia (HCC)   . Hyperlipidemia   . Hypertension   . Incomplete bladder emptying   . Microscopic hematuria   . Pacemaker   . Straining on urination   . Symptomatic bradycardia   . Urge incontinence of urine   . UTI (lower urinary tract infection)     Medications:  Medications Prior to Admission  Medication Sig Dispense Refill Last Dose  . cephALEXin (KEFLEX) 250 MG capsule Take 250 mg by  mouth at bedtime.   12/22/2019 at Unknown time  . furosemide (LASIX) 40 MG tablet Take 20 mg by mouth daily.    12/23/2019 at Unknown time  . guaifenesin (ROBITUSSIN) 100 MG/5ML syrup Take 200-400 mg by mouth 4 (four) times daily as needed for cough.    12/23/2019 at Unknown time  . loratadine (CLARITIN) 10 MG tablet Take 10 mg by mouth daily as needed for allergies.   12/23/2019 at Unknown time  . metoprolol tartrate (LOPRESSOR) 25 MG tablet Take 1 tablet each morning and 1/2 tablet each evening.  (okay to crush) (Patient taking differently: Take 12.5-25 mg by mouth See admin instructions. Take 1 tablet each morning and 1/2 tablet each evening.  (okay to crush)) 135 tablet 3 12/23/2019 at 1030  . Multiple Vitamins-Minerals (CENTRUM SILVER PO) Take 1 tablet by mouth daily.     12/23/2019 at Unknown time  . omeprazole (PRILOSEC) 20 MG capsule Take 20 mg by mouth every morning.    12/23/2019 at Unknown time  . potassium chloride SA (K-DUR,KLOR-CON) 20 MEQ tablet Take 20 mEq by mouth every morning.    12/23/2019 at Unknown time  . simvastatin (ZOCOR) 10 MG tablet Take 10 mg by mouth daily.   12/22/2019 at Unknown time  . Travoprost, BAK Free, (TRAVATAN Z) 0.004 % SOLN ophthalmic solution Place 1 drop into both eyes at bedtime.  12/22/2019 at Unknown time  . warfarin (COUMADIN) 3 MG tablet TAKE 1 TABLET DAILY EXCEPT TAKE 1/2 TABLET ON MONDAYS AND THURSDAYS OR AS DIRECTED. (Patient taking differently: Take 1.5-3 mg by mouth See admin instructions. TAKE 1 TABLET DAILY EXCEPT TAKE 1/2 TABLET ON MONDAYS AND THURSDAYS OR AS DIRECTED.) 90 tablet 1 12/22/2019 at 1830  . nitrofurantoin, macrocrystal-monohydrate, (MACROBID) 100 MG capsule Take 1 capsule (100 mg total) by mouth 2 (two) times daily. 14 capsule 0     Assessment: Pharmacy consulted to dose warfarin in patient with atrial fibrillation.  INR 1.9>> 2.7>2.4>2.7  Home regimen lsted as 1.5 mg on Mon and Thurs and 3 mg ROW.  Goal of Therapy:  INR 2-3 Monitor  platelets by anticoagulation protocol: Yes   Plan:  Warfarin 1.5 mg x 1 today Monitor labs and s/s of bleeding  Margot Ables, PharmD Clinical Pharmacist 12/27/2019 10:06 AM

## 2019-12-27 NOTE — Clinical Social Work Note (Signed)
At baseline, patient has caregivers from 12:30-5:30 and 6:30-8:30, however they are COVID+ and are out until next week. Patient's daughter has a camera system set up so that they can see her in her home at night during sleep hours. Daughter and son in law are also COVID+ and report they are too fatigued to care for patient. PT eval recommended SNF. Referrals sent to COVID accepting facilities.      Deliana Avalos, Juleen China, LCSW

## 2019-12-27 NOTE — NC FL2 (Signed)
[place] MEDICAID FL2 LEVEL OF CARE SCREENING TOOL     IDENTIFICATION  Patient Name: [name] Birthdate: [birth year] Sex: female Admission Date (Current Location): 12/23/2019  [facility] [place] Number:  [employer] and Address:  [facility],  [address] [place] [address]      Provider Number: [id number]  Attending Physician Name and Address:  Shon Hale, MD  Relative Name and Phone Number:       Current Level of Care: Hospital Recommended Level of Care: Skilled Nursing Facility Prior Approval Number:    Date Approved/Denied:   PASRR Number: [id number]  Discharge Plan: SNF    Current Diagnoses: Patient Active Problem List   Diagnosis Date Noted  . Acute respiratory disease due to COVID-19 virus 12/23/2019  . Dementia without behavioral disturbance (HCC) 12/23/2019  . Acute lower UTI--- H/o E coli ESBL 12/23/2019  . Pressure injury of skin 12/05/2017  . Metabolic encephalopathy 12/04/2017  . Hyperlipidemia 11/14/2013  . Mitral insufficiency 11/14/2013  . Symptomatic bradycardia 07/05/2013  . Pacemaker - Medtronic 2007, new generator 2014 07/05/2013  . Atrial fibrillation (HCC) 03/15/2013  . Long term current use of anticoagulant therapy 03/15/2013    Orientation RESPIRATION BLADDER Height & Weight     Self  Normal Incontinent Weight: 153 lb (69.4 kg) Height:  5' (152.4 cm)  BEHAVIORAL SYMPTOMS/MOOD NEUROLOGICAL BOWEL NUTRITION STATUS      Incontinent (DYS 3)  AMBULATORY STATUS COMMUNICATION OF NEEDS Skin   Extensive Assist Verbally PU Stage and Appropriate Care PU Stage 1 Dressing: (Sacrum) PU Stage 2 Dressing: (Buttocks, right, posterior)                   Personal Care Assistance Level of Assistance  Bathing, Feeding, Dressing Bathing Assistance: Maximum assistance Feeding assistance: Maximum assistance Dressing Assistance: Maximum assistance     Functional Limitations Info  Sight, Hearing, Speech Sight  Info: Adequate Hearing Info: Adequate Speech Info: Adequate    SPECIAL CARE FACTORS FREQUENCY  PT (By licensed PT)     PT Frequency: 5x/week              Contractures Contractures Info: Not present    Additional Factors Info  Code Status, Allergies, Isolation Precautions Code Status Info: DNR Allergies Info: Contrast Media, Diagnostic Agents, Penicillins, Sulfa Antibiotics     Isolation Precautions Info: 12/24/19 COVID+,  01/02/19 Multi drug resistant E coli, 05/06/19 ESBL+ urine culture,     Current Medications (12/27/2019):  This is the current hospital active medication list Current Facility-Administered Medications  Medication Dose Route Frequency Provider Last Rate Last Admin  . 0.9 %  sodium chloride infusion  250 mL Intravenous PRN Shon Hale, MD   Stopped at 12/25/19 0555  . acetaminophen (TYLENOL) tablet 650 mg  650 mg Oral Q6H PRN Emokpae, Courage, MD       Or  . acetaminophen (TYLENOL) suppository 650 mg  650 mg Rectal Q6H PRN Emokpae, Courage, MD      . albuterol (VENTOLIN HFA) 108 (90 Base) MCG/ACT inhaler 2 puff  2 puff Inhalation TID Shon Hale, MD   2 puff at 12/26/19 1942  . ascorbic acid (VITAMIN C) tablet 500 mg  500 mg Oral Daily Emokpae, Courage, MD   500 mg at 12/27/19 1044  . chlorpheniramine-HYDROcodone (TUSSIONEX) 10-8 MG/5ML suspension 5 mL  5 mL Oral Q12H PRN Emokpae, Courage, MD      . dexamethasone (DECADRON) injection 6 mg  6 mg Intravenous Q24H Emokpae, Courage,  MD   6 mg at 12/26/19 1612  . dextrose 5 %-0.45 % sodium chloride infusion   Intravenous Continuous Roxan Hockey, MD 75 mL/hr at 12/27/19 0313 New Bag at 12/27/19 0313  . folic acid (FOLVITE) tablet 1 mg  1 mg Oral Daily Emokpae, Courage, MD   1 mg at 12/27/19 1043  . guaiFENesin (MUCINEX) 12 hr tablet 600 mg  600 mg Oral BID Denton Brick, Courage, MD   600 mg at 12/27/19 1044  . guaiFENesin-dextromethorphan (ROBITUSSIN DM) 100-10 MG/5ML syrup 10 mL  10 mL Oral Q4H PRN Emokpae,  Courage, MD      . latanoprost (XALATAN) 0.005 % ophthalmic solution 1 drop  1 drop Both Eyes QHS Emokpae, Courage, MD   1 drop at 12/26/19 1956  . metoprolol tartrate (LOPRESSOR) tablet 12.5 mg  12.5 mg Oral QHS Emokpae, Courage, MD   12.5 mg at 12/26/19 1956  . metoprolol tartrate (LOPRESSOR) tablet 25 mg  25 mg Oral Daily Emokpae, Courage, MD   25 mg at 12/27/19 1044  . multivitamin with minerals tablet 1 tablet  1 tablet Oral Daily Roxan Hockey, MD   1 tablet at 12/27/19 1043  . ondansetron (ZOFRAN) tablet 4 mg  4 mg Oral Q6H PRN Emokpae, Courage, MD       Or  . ondansetron (ZOFRAN) injection 4 mg  4 mg Intravenous Q6H PRN Emokpae, Courage, MD      . pantoprazole (PROTONIX) EC tablet 40 mg  40 mg Oral Daily Emokpae, Courage, MD   40 mg at 12/26/19 1012  . polyethylene glycol (MIRALAX / GLYCOLAX) packet 17 g  17 g Oral Daily PRN Emokpae, Courage, MD      . QUEtiapine (SEROQUEL) tablet 25 mg  25 mg Oral QHS Emokpae, Courage, MD   25 mg at 12/26/19 1945  . simvastatin (ZOCOR) tablet 10 mg  10 mg Oral Daily Emokpae, Courage, MD   10 mg at 12/26/19 1945  . sodium chloride flush (NS) 0.9 % injection 3 mL  3 mL Intravenous Q12H Emokpae, Courage, MD   3 mL at 12/25/19 2126  . sodium chloride flush (NS) 0.9 % injection 3 mL  3 mL Intravenous PRN Roxan Hockey, MD   3 mL at 12/25/19 2126  . thiamine tablet 100 mg  100 mg Oral Daily Emokpae, Courage, MD   100 mg at 12/27/19 1044  . traZODone (DESYREL) tablet 50 mg  50 mg Oral QHS PRN Emokpae, Courage, MD      . warfarin (COUMADIN) tablet 1.5 mg  1.5 mg Oral ONCE-1800 Emokpae, Courage, MD      . Warfarin - Pharmacist Dosing Inpatient   Does not apply [id number] Roxan Hockey, MD   Given at 12/26/19 1617  . zinc sulfate capsule 220 mg  220 mg Oral Daily Emokpae, Courage, MD   220 mg at 12/27/19 1043     Discharge Medications: Please see discharge summary for a list of discharge medications.  Relevant Imaging Results:  Relevant Lab  Results:   Additional Information SSN: [id number]  Aamina Skiff, Clydene Pugh, LCSW

## 2019-12-27 NOTE — Progress Notes (Signed)
Patient Demographics:    [name], is a 86 y.o. female, DOB - 1927/01/30, CBU:[id number]  Admit date - 12/23/2019   Admitting Physician Rebekkah Powless Mariea Clonts, MD  Outpatient Primary MD for the patient is Avon Gully, MD  LOS - 4   Chief Complaint  Patient presents with  . Weakness  . Cough        Subjective:    [name] today has no fevers, no emesis,  No chest pain,  - Oral intake is poor, no vomiting or diarrhea  -Voiding fair -Does not follow instructions -Patient just wants to be left alone  Assessment  & Plan :    Principal Problem:   Acute respiratory disease due to COVID-19 virus Active Problems:   Atrial fibrillation [facility])   Pacemaker - Medtronic 2007, new generator 2014   Long term current use of anticoagulant therapy   Dementia without behavioral disturbance (HCC)   Acute lower UTI--- H/o E coli ESBL  Brief Summary:- 86 y.o. female with past medical history relevant for atrial fibrillation with status post prior pacemaker placement and history of prior ESBL E. coli UTI admitted on 12/22/2018 with COVID-19 pneumonia and poor oral intake  A/p 1)Acute hypoxic respiratory failure secondary to COVID-19 infection/pneumonia-- --Patient is positive for COVID-19 infection, chest x-ray with findings of infiltrates/opacities, - -dated 5 days of IV remdesivir on 12/27/2019  -Continue Steroid therapy per protocol -started on 12/23/19--- -Hypoxia resolved --Attempt to maintain euvolemic state --Zinc and vitamin C as ordered -Albuterol inhaler as needed   2)Presumed UTI--- H/o Prior ESBL E. coli UTI--treated empirically with IV Meropenem , cultures were obtained after patient has been on antibiotics for over 24 hours- Treated with meropenem, through 12/26/2019, received fosfomycin 3 g x 1 dose on 12/26/2019 --cultures without definite growth at this time  3) chronic atrial  fibrillation/S/p PPM-- coumadin and metoprolol as ordered, INR is therapeutic above 2  4) social/ethics-- patient is a DNR  5) advanced dementia--- patient with significant cognitive difficulties, no significant agitation --Continue routine cares  6)FEN/Hyperkalemia-- -Oral intake remains challenging, treated with Lokelma for hyperkalemia -Continue D5 half-normal solution -Monitor urine output closely  7) generalized weakness/deconditioning in the setting of advanced dementia with significant cognitive and memory deficits--- physical therapy evaluation appreciated,, spoke with patient's daughter and son-in-law -PTA patient lived alone with caregivers coming for several hours a day -Awaiting possible transfer to SNF rehab  Disposition/Need for in-Hospital Stay- patient unable to be discharged at this time due to ----awaiting possible transfer to SNF rehab  Code Status : DNR  Family Communication:    -Called and discussed with patient's daughter and son-in-law  Consults  :  na  DVT Prophylaxis  : Coumadin- SCDs *  Lab Results  Component Value Date   PLT 131 (L) 12/26/2019    Inpatient Medications  Scheduled Meds: . albuterol  2 puff Inhalation TID  . vitamin C  500 mg Oral Daily  . dexamethasone (DECADRON) injection  6 mg Intravenous Q24H  . folic acid  1 mg Oral Daily  . guaiFENesin  600 mg Oral BID  . latanoprost  1 drop Both Eyes QHS  . metoprolol tartrate  12.5 mg Oral QHS  . metoprolol tartrate  25 mg Oral Daily  .  multivitamin with minerals  1 tablet Oral Daily  . pantoprazole  40 mg Oral Daily  . QUEtiapine  25 mg Oral QHS  . simvastatin  10 mg Oral Daily  . sodium chloride flush  3 mL Intravenous Q12H  . thiamine  100 mg Oral Daily  . warfarin  1.5 mg Oral ONCE-1800  . Warfarin - Pharmacist Dosing Inpatient   Does not apply q1800  . zinc sulfate  220 mg Oral Daily   Continuous Infusions: . sodium chloride Stopped (12/25/19 0555)  . dextrose 5 % and 0.45%  NaCl 75 mL/hr at 12/27/19 0313   PRN Meds:.sodium chloride, acetaminophen **OR** acetaminophen, chlorpheniramine-HYDROcodone, guaiFENesin-dextromethorphan, ondansetron **OR** ondansetron (ZOFRAN) IV, polyethylene glycol, sodium chloride flush, traZODone    Anti-infectives (From admission, onward)   Start     Dose/Rate Route Frequency Ordered Stop   12/26/19 1800  fosfomycin (MONUROL) packet 3 g     3 g Oral  Once 12/26/19 1016 12/26/19 1613   12/24/19 1500  cefTRIAXone (ROCEPHIN) 1 g in sodium chloride 0.9 % 100 mL IVPB  Status:  Discontinued     1 g 200 mL/hr over 30 Minutes Intravenous Every 24 hours 12/23/19 1507 12/23/19 1727   12/24/19 1000  remdesivir 100 mg in sodium chloride 0.9 % 100 mL IVPB     100 mg 200 mL/hr over 30 Minutes Intravenous Daily 12/23/19 1457 12/27/19 1113   12/23/19 2200  meropenem (MERREM) 1 g in sodium chloride 0.9 % 100 mL IVPB  Status:  Discontinued     1 g 200 mL/hr over 30 Minutes Intravenous Every 12 hours 12/23/19 1727 12/26/19 1016   12/23/19 1530  remdesivir 200 mg in sodium chloride 0.9% 250 mL IVPB     200 mg 580 mL/hr over 30 Minutes Intravenous Once 12/23/19 1457 12/23/19 1830   12/23/19 1500  cefTRIAXone (ROCEPHIN) 1 g in sodium chloride 0.9 % 100 mL IVPB     1 g 200 mL/hr over 30 Minutes Intravenous  Once 12/23/19 1434 12/23/19 1650        Objective:   Vitals:   12/26/19 1956 12/27/19 0300 12/27/19 0951 12/27/19 1359  BP: 127/78 (!) 156/82  (!) 146/49  Pulse: 95 (!) 105  66  Resp: 20 20  18   Temp: 97.7 F (36.5 C) (!) 97.5 F (36.4 C)    TempSrc: Axillary Axillary    SpO2: 95% 97% 95% 98%  Weight:      Height:        Wt Readings from Last 3 Encounters:  12/23/19 69.4 kg  01/01/19 69.7 kg  12/29/18 71.7 kg     Intake/Output Summary (Last 24 hours) at 12/27/2019 1545 Last data filed at 12/27/2019 1300 Gross per 24 hour  Intake 2241.02 ml  Output 1550 ml  Net 691.02 ml     Physical Exam  Gen:- Awake Alert, resting  HEENT:- [employer].AT, No sclera icterus Neck-Supple Neck,No JVD,.  Lungs improved air movement,, no wheezing  CV- S1, S2 normal, regular  Abd-  +ve B.Sounds, Abd Soft, No tenderness,    Extremity/Skin:- No  edema, pedal pulses present  Psych- pleasantly confused, disoriented, at baseline patient has severe/significant cognitive and memory deficits Neuro-generalized weakness, no new focal deficits, no tremors   Data Review:   Micro Results Recent Results (from the past 240 hour(s))  Blood Culture (routine x 2)     Status: None (Preliminary result)   Collection Time: 12/23/19  1:05 PM   Specimen: Right Antecubital; Blood  Result  Value Ref Range Status   Specimen Description RIGHT ANTECUBITAL  Final   Special Requests   Final    BOTTLES DRAWN AEROBIC ONLY Blood Culture results may not be optimal due to an inadequate volume of blood received in culture bottles   Culture   Final    NO GROWTH 4 DAYS Performed at [facility], [address], [place], [place] [address]    Report Status PENDING  Incomplete  Blood Culture (routine x 2)     Status: None (Preliminary result)   Collection Time: 12/23/19  1:20 PM   Specimen: Left Antecubital; Blood  Result Value Ref Range Status   Specimen Description LEFT ANTECUBITAL  Final   Special Requests   Final    BOTTLES DRAWN AEROBIC AND ANAEROBIC Blood Culture adequate volume   Culture   Final    NO GROWTH 4 DAYS Performed at [facility], [address], [place], [place] [address]    Report Status PENDING  Incomplete  Urine Culture     Status: Abnormal   Collection Time: 12/25/19 12:45 AM   Specimen: Urine, Clean Catch  Result Value Ref Range Status   Specimen Description   Final    URINE, CLEAN CATCH Performed at [facility], [address], [place], [place] [address]    Special Requests   Final    Normal Performed at [facility], [address] [place], [place] [address]    Culture (A)  Final    <10,000 COLONIES/mL INSIGNIFICANT GROWTH  Performed at [facility], [place] [address], [place], [place] [address]    Report Status 12/26/2019 FINAL  Final    Radiology Reports DG Chest Portable 1 View  Result Date: 12/23/2019 CLINICAL DATA:  Cough. EXAM: PORTABLE CHEST 1 VIEW COMPARISON:  11/29/2018 and 11/24/2018 FINDINGS: Chronic cardiomegaly with extensive aortic atherosclerosis. Pulmonary vascularity is within normal limits. Slight new atelectasis at the left base. Single tiny area of atelectasis at the right base. Pacemaker in place. Old compression fracture in the lower thoracic spine. IMPRESSION: New slight atelectasis at the left base. Minimal new atelectasis at the right base. Chronic cardiomegaly.  Aortic Atherosclerosis (ICD10-I70.0). Electronically Signed   By: Lorriane Shire M.D.   On: 12/23/2019 13:12     CBC Recent Labs  Lab 12/23/19 1315 12/24/19 0557 12/25/19 1009 12/26/19 0723  WBC 3.6* 2.5* 2.7* 3.4*  HGB 12.8 12.7 14.2 14.5  HCT 42.2 42.4 46.7* 48.8*  PLT 117* 106* 124* 131*  MCV 98.4 99.3 98.1 100.2*  MCH 29.8 29.7 29.8 29.8  MCHC 30.3 30.0 30.4 29.7*  RDW 14.7 14.6 14.1 14.0  LYMPHSABS 0.8 0.8 1.1 1.2  MONOABS 0.3 0.1 0.2 0.4  EOSABS 0.0 0.0 0.0 0.0  BASOSABS 0.0 0.0 0.0 0.0    Chemistries  Recent Labs  Lab 12/23/19 1315 12/24/19 0557 12/25/19 1009 12/26/19 0723 12/27/19 0622  NA 135 139 136 134* 135  K 4.5 4.6 4.7 5.4* 4.0  CL 104 108 104 105 102  CO2 24 24 24 22 25   GLUCOSE 151* 159* 156* 139* 166*  BUN 29* 28* 28* 24* 21  CREATININE 1.16* 0.85 0.84 0.74 0.60  CALCIUM 8.0* 8.0* 8.3* 8.0* 7.9*  MG  --  1.9 2.0 1.9  --   AST 41 34 34 37  --   ALT 18 15 19 18   --   ALKPHOS 60 55 58 51  --   BILITOT 0.5 0.5 0.4 1.2  --    ------------------------------------------------------------------------------------------------------------------ No results  for input(s): CHOL, HDL, LDLCALC, TRIG, CHOLHDL, LDLDIRECT in the last 72 hours.  No results found for: HGBA1C  ------------------------------------------------------------------------------------------------------------------ No results for input(s): TSH, T4TOTAL, T3FREE, THYROIDAB in the last 72 hours.  Invalid input(s): FREET3 ------------------------------------------------------------------------------------------------------------------ Recent Labs    12/25/19 1009 12/26/19 0723  FERRITIN 614* 683*    Coagulation profile Recent Labs  Lab 12/23/19 1315 12/24/19 0557 12/25/19 1009 12/26/19 1503 12/27/19 0622  INR 1.7* 1.9* 2.7* 2.4* 2.7*    Recent Labs    12/25/19 1009  DDIMER 1.93*    Cardiac Enzymes No results for input(s): CKMB, TROPONINI, MYOGLOBIN in the last 168 hours.  Invalid input(s): CK ------------------------------------------------------------------------------------------------------------------ No results found for: BNP   Shon Hale M.D on 12/27/2019 at 3:45 PM  Go to [email or web address] - for contact info  [facility] - Office  [phone]

## 2019-12-27 NOTE — Plan of Care (Signed)
  Problem: Acute Rehab PT Goals(only PT should resolve) Goal: Pt Will Go Supine/Side To Sit Outcome: Progressing Flowsheets (Taken 12/27/2019 1545) Pt will go Supine/Side to Sit: with minimal assist Goal: Patient Will Transfer Sit To/From Stand Outcome: Progressing Flowsheets (Taken 12/27/2019 1545) Patient will transfer sit to/from stand: with moderate assist Goal: Pt Will Transfer Bed To Chair/Chair To Bed Outcome: Progressing Flowsheets (Taken 12/27/2019 1545) Pt will Transfer Bed to Chair/Chair to Bed: with mod assist Goal: Pt Will Ambulate Outcome: Progressing Flowsheets (Taken 12/27/2019 1545) Pt will Ambulate:  25 feet  with moderate assist  with rolling walker   3:45 PM, 12/27/19 Ocie Bob, MPT Physical Therapist with [facility] [phone] office [phone] mobile phone

## 2019-12-27 NOTE — Evaluation (Signed)
Physical Therapy Evaluation Patient Details Name: [name] MRN: [id number] DOB: [birth year] Today's Date: 12/27/2019   History of Present Illness  [name]  is a 86 y.o. female with past medical history relevant for atrial fibrillation with status post prior pacemaker placement and history of prior ESBL E. coli UTI on underlying dementia presents to the ED after a fall    Clinical Impression  Patient very apprehensive and requires Max verbal/tactile cueing to participate.  Patient required Max assist to sit up at bedside mostly due to resisting movement, extreme fear of falling and declined to attempt sit to stands or transfers.  Patient will benefit from continued physical therapy in hospital and recommended venue below to increase strength, balance, endurance for safe ADLs and gait.    Follow Up Recommendations SNF;Supervision for mobility/OOB;Supervision/Assistance - 24 hour    Equipment Recommendations  None recommended by PT    Recommendations for Other Services       Precautions / Restrictions Precautions Precautions: Fall Restrictions Weight Bearing Restrictions: No      Mobility  Bed Mobility Overal bed mobility: Needs Assistance Bed Mobility: Supine to Sit;Sit to Supine     Supine to sit: Max assist Sit to supine: Mod assist   General bed mobility comments: patient very apprehensive  Transfers                    Ambulation/Gait                Stairs            Wheelchair Mobility    Modified Rankin (Stroke Patients Only)       Balance Overall balance assessment: Needs assistance Sitting-balance support: Feet supported;No upper extremity supported Sitting balance-Leahy Scale: Fair Sitting balance - Comments: fair/good seated at EOB                                     Pertinent Vitals/Pain Pain Assessment: No/denies pain    Home Living Family/patient expects to be discharged to:: Private  residence Living Arrangements: Alone Available Help at Discharge: Personal care attendant Type of Home: House Home Access: Stairs to enter Entrance Stairs-Rails: None Entrance Stairs-Number of Steps: 1 Home Layout: One level Home Equipment: Walker - 4 wheels;Hand held shower head;Shower seat - built in      Prior Function Level of Independence: Needs assistance   Gait / Transfers Assistance Needed: Supervised for household ambulation using RW  ADL's / Homemaking Assistance Needed: assisted by personal care attendants 4 hours/day x 5 days/week, daughter visits at least 2x/daily and spend more time over the weekend        Hand Dominance   Dominant Hand: Right    Extremity/Trunk Assessment   Upper Extremity Assessment Upper Extremity Assessment: Generalized weakness    Lower Extremity Assessment Lower Extremity Assessment: Generalized weakness    Cervical / Trunk Assessment Cervical / Trunk Assessment: Normal  Communication   Communication: HOH  Cognition Arousal/Alertness: Awake/alert Behavior During Therapy: Anxious Overall Cognitive Status: History of cognitive impairments - at baseline                                        General Comments      Exercises     Assessment/Plan    PT Assessment Patient needs continued  PT services  PT Problem List Decreased strength;Decreased activity tolerance;Decreased balance;Decreased mobility       PT Treatment Interventions Gait training;Stair training;Functional mobility training;Therapeutic activities;Patient/family education    PT Goals (Current goals can be found in the Care Plan section)  Acute Rehab PT Goals Patient Stated Goal: none stated PT Goal Formulation: With patient Time For Goal Achievement: 01/10/20 Potential to Achieve Goals: Good    Frequency Min 3X/week   Barriers to discharge        Co-evaluation               AM-PAC PT "6 Clicks" Mobility  Outcome Measure Help  needed turning from your back to your side while in a flat bed without using bedrails?: A Lot Help needed moving from lying on your back to sitting on the side of a flat bed without using bedrails?: A Lot Help needed moving to and from a bed to a chair (including a wheelchair)?: Total Help needed standing up from a chair using your arms (e.g., wheelchair or bedside chair)?: Total Help needed to walk in hospital room?: Total Help needed climbing 3-5 steps with a railing? : Total 6 Click Score: 8    End of Session   Activity Tolerance: Patient limited by fatigue;Treatment limited secondary to agitation Patient left: in bed;with call bell/phone within reach;with chair alarm set Nurse Communication: Mobility status PT Visit Diagnosis: Unsteadiness on feet (R26.81);Other abnormalities of gait and mobility (R26.89);Muscle weakness (generalized) (M62.81)    Time: 1450-1520 PT Time Calculation (min) (ACUTE ONLY): 30 min   Charges:   PT Evaluation $PT Eval Moderate Complexity: 1 Mod PT Treatments $Therapeutic Activity: 23-37 mins        3:43 PM, 12/27/19 Lonell Grandchild, MPT Physical Therapist with [facility] [phone] office [phone] mobile phone

## 2019-12-28 LAB — GLUCOSE, CAPILLARY
Glucose-Capillary: 170 mg/dL — ABNORMAL HIGH (ref 70–99)
Glucose-Capillary: 141 mg/dL — ABNORMAL HIGH (ref 70–99)
Glucose-Capillary: 110 mg/dL — ABNORMAL HIGH (ref 70–99)
Glucose-Capillary: 148 mg/dL — ABNORMAL HIGH (ref 70–99)

## 2019-12-28 LAB — CULTURE, BLOOD (ROUTINE X 2)
Culture: NO GROWTH
Culture: NO GROWTH
Special Requests: ADEQUATE

## 2019-12-28 LAB — PROTIME-INR
INR: 2.9 — ABNORMAL HIGH (ref 0.8–1.2)
Prothrombin Time: 30 seconds — ABNORMAL HIGH (ref 11.4–15.2)

## 2019-12-28 MED ORDER — WARFARIN SODIUM 1 MG PO TABS
1.0000 mg | ORAL_TABLET | Freq: Once | ORAL | Status: AC
  Administered 2019-12-28: 1 mg via ORAL

## 2019-12-28 MED ORDER — DRONABINOL 2.5 MG PO CAPS
2.5000 mg | ORAL_CAPSULE | Freq: Two times a day (BID) | ORAL | Status: DC
  Administered 2019-12-28 – 2020-01-01 (×8): 2.5 mg via ORAL
  Filled 2019-12-28 (×9): qty 1

## 2019-12-28 NOTE — Progress Notes (Addendum)
Patient Demographics:    [name], is a 86 y.o. female, DOB - 11/25/1927, GNF:[id number]  Admit date - 12/23/2019   Admitting Physician Pria Klosinski Mariea Clonts, MD  Outpatient Primary MD for the patient is Avon Gully, MD  LOS - 5   Chief Complaint  Patient presents with  . Weakness  . Cough        Subjective:    [name] today has no fevers, no emesis,  No chest pain,  - Resting , appears comfortable Appetite is poor -medically stable, awaiting possible transfer to SNF rehab   Assessment  & Plan :    Principal Problem:   Acute respiratory disease due to COVID-19 virus Active Problems:   Atrial fibrillation [facility])   Pacemaker - Medtronic 2007, new generator 2014   Long term current use of anticoagulant therapy   Dementia without behavioral disturbance (HCC)   Acute lower UTI--- H/o E coli ESBL  Brief Summary:- 86 y.o. female with past medical history relevant for atrial fibrillation with status post prior pacemaker placement and history of prior ESBL E. coli UTI admitted on 12/22/2018 with COVID-19 pneumonia and poor oral intake -medically stable, awaiting possible transfer to SNF rehab   A/p 1)Acute hypoxic respiratory failure secondary to COVID-19 infection/pneumonia-- --Patient is positive for COVID-19 infection, chest x-ray with findings of infiltrates/opacities, - -completed 5 days of IV remdesivir on 12/27/2019  -Continue Steroid therapy per protocol -started on 12/23/19--- -Hypoxia has resolved --Attempt to maintain euvolemic state --Zinc and vitamin C as ordered -Albuterol inhaler as needed   2)Presumed UTI--- H/o Prior ESBL E. coli UTI--treated empirically with IV Meropenem , cultures were obtained after patient has been on antibiotics for over 24 hours- Treated with meropenem, through 12/26/2019, received fosfomycin 3 g x 1 dose on 12/26/2019 --cultures without definite  growth at this time -No further antibiotics  3) chronic atrial fibrillation/S/p PPM-- coumadin and metoprolol as ordered, INR is therapeutic above 2  4) social/ethics-- patient is a DNR  5) advanced dementia--- patient with significant cognitive difficulties, no significant agitation --Continue routine cares  6)FEN/Hyperkalemia-- -Oral intake remains challenging, treated with Lokelma for hyperkalemia -Decrease IV fluid to 30 mL an hour -Monitor urine output closely -Try Marinol for appetite stimulation  7) generalized weakness/deconditioning in the setting of advanced dementia with significant cognitive and memory deficits--- physical therapy evaluation appreciated,, spoke with patient's daughter and son-in-law -PTA patient lived alone with caregivers coming for several hours a day -Awaiting possible transfer to SNF rehab  Disposition/Need for in-Hospital Stay- patient unable to be discharged at this time due to ----medically stable, awaiting possible transfer to SNF rehab  Code Status : DNR  Family Communication:    -Called and discussed with patient's daughter and son-in-law  Consults  :  na  DVT Prophylaxis  : Coumadin- SCDs *  Lab Results  Component Value Date   PLT 131 (L) 12/26/2019    Inpatient Medications  Scheduled Meds: . albuterol  2 puff Inhalation TID  . vitamin C  500 mg Oral Daily  . dexamethasone (DECADRON) injection  6 mg Intravenous Q24H  . folic acid  1 mg Oral Daily  . guaiFENesin  600 mg Oral BID  . latanoprost  1 drop Both Eyes QHS  .  metoprolol tartrate  12.5 mg Oral QHS  . metoprolol tartrate  25 mg Oral Daily  . multivitamin with minerals  1 tablet Oral Daily  . pantoprazole  40 mg Oral Daily  . QUEtiapine  25 mg Oral QHS  . simvastatin  10 mg Oral Daily  . sodium chloride flush  3 mL Intravenous Q12H  . thiamine  100 mg Oral Daily  . warfarin  1 mg Oral ONCE-1800  . Warfarin - Pharmacist Dosing Inpatient   Does not apply q1800  . zinc  sulfate  220 mg Oral Daily   Continuous Infusions: . sodium chloride Stopped (12/25/19 0555)  . dextrose 5 % and 0.45% NaCl 75 mL/hr at 12/28/19 0856   PRN Meds:.sodium chloride, acetaminophen **OR** acetaminophen, chlorpheniramine-HYDROcodone, guaiFENesin-dextromethorphan, ondansetron **OR** ondansetron (ZOFRAN) IV, polyethylene glycol, sodium chloride flush, traZODone    Anti-infectives (From admission, onward)   Start     Dose/Rate Route Frequency Ordered Stop   12/26/19 1800  fosfomycin (MONUROL) packet 3 g     3 g Oral  Once 12/26/19 1016 12/26/19 1613   12/24/19 1500  cefTRIAXone (ROCEPHIN) 1 g in sodium chloride 0.9 % 100 mL IVPB  Status:  Discontinued     1 g 200 mL/hr over 30 Minutes Intravenous Every 24 hours 12/23/19 1507 12/23/19 1727   12/24/19 1000  remdesivir 100 mg in sodium chloride 0.9 % 100 mL IVPB     100 mg 200 mL/hr over 30 Minutes Intravenous Daily 12/23/19 1457 12/27/19 1113   12/23/19 2200  meropenem (MERREM) 1 g in sodium chloride 0.9 % 100 mL IVPB  Status:  Discontinued     1 g 200 mL/hr over 30 Minutes Intravenous Every 12 hours 12/23/19 1727 12/26/19 1016   12/23/19 1530  remdesivir 200 mg in sodium chloride 0.9% 250 mL IVPB     200 mg 580 mL/hr over 30 Minutes Intravenous Once 12/23/19 1457 12/23/19 1830   12/23/19 1500  cefTRIAXone (ROCEPHIN) 1 g in sodium chloride 0.9 % 100 mL IVPB     1 g 200 mL/hr over 30 Minutes Intravenous  Once 12/23/19 1434 12/23/19 1650        Objective:   Vitals:   12/28/19 0459 12/28/19 0858 12/28/19 0925 12/28/19 1305  BP: 107/89   (!) 117/56  Pulse: 75 97  82  Resp: 16   18  Temp: 97.7 F (36.5 C)   97.8 F (36.6 C)  TempSrc: Oral   Oral  SpO2: 96%  92% 98%  Weight:      Height:        Wt Readings from Last 3 Encounters:  12/23/19 69.4 kg  01/01/19 69.7 kg  12/29/18 71.7 kg     Intake/Output Summary (Last 24 hours) at 12/28/2019 1310 Last data filed at 12/28/2019 0900 Gross per 24 hour  Intake 1207.94  ml  Output --  Net 1207.94 ml     Physical Exam  Gen:- Awake Alert, resting HEENT:- [place].AT, No sclera icterus Neck-Supple Neck,No JVD,.  Lungs improved air movement,, no wheezing  CV- S1, S2 normal, regular , left subclavian PPM in situ Abd-  +ve B.Sounds, Abd Soft, No tenderness,    Extremity/Skin:- No  edema, pedal pulses present  Psych- pleasantly confused, disoriented, at baseline patient has severe/significant cognitive and memory deficits Neuro-generalized weakness, no new focal deficits, no tremors   Data Review:   Micro Results Recent Results (from the past 240 hour(s))  Blood Culture (routine x 2)     Status:  None   Collection Time: 12/23/19  1:05 PM   Specimen: Right Antecubital; Blood  Result Value Ref Range Status   Specimen Description RIGHT ANTECUBITAL  Final   Special Requests   Final    BOTTLES DRAWN AEROBIC ONLY Blood Culture results may not be optimal due to an inadequate volume of blood received in culture bottles   Culture   Final    NO GROWTH 5 DAYS Performed at [facility], [address] [place], [place] [address]    Report Status 12/28/2019 FINAL  Final  Blood Culture (routine x 2)     Status: None   Collection Time: 12/23/19  1:20 PM   Specimen: Left Antecubital; Blood  Result Value Ref Range Status   Specimen Description LEFT ANTECUBITAL  Final   Special Requests   Final    BOTTLES DRAWN AEROBIC AND ANAEROBIC Blood Culture adequate volume   Culture   Final    NO GROWTH 5 DAYS Performed at [facility], [address], [place], [place] [address]    Report Status 12/28/2019 FINAL  Final  Urine Culture     Status: Abnormal   Collection Time: 12/25/19 12:45 AM   Specimen: Urine, Clean Catch  Result Value Ref Range Status   Specimen Description   Final    URINE, CLEAN CATCH Performed at [facility], [address] [place], [place] [address]    Special Requests   Final    Normal Performed at [facility], [address] [place], [place]  [address]    Culture (A)  Final    <10,000 COLONIES/mL INSIGNIFICANT GROWTH Performed at [facility], [address], [place], [place] [address]    Report Status 12/26/2019 FINAL  Final    Radiology Reports DG Chest Portable 1 View  Result Date: 12/23/2019 CLINICAL DATA:  Cough. EXAM: PORTABLE CHEST 1 VIEW COMPARISON:  11/29/2018 and 11/24/2018 FINDINGS: Chronic cardiomegaly with extensive aortic atherosclerosis. Pulmonary vascularity is within normal limits. Slight new atelectasis at the left base. Single tiny area of atelectasis at the right base. Pacemaker in place. Old compression fracture in the lower thoracic spine. IMPRESSION: New slight atelectasis at the left base. Minimal new atelectasis at the right base. Chronic cardiomegaly.  Aortic Atherosclerosis (ICD10-I70.0). Electronically Signed   By: Francene Boyers M.D.   On: 12/23/2019 13:12     CBC Recent Labs  Lab 12/23/19 1315 12/24/19 0557 12/25/19 1009 12/26/19 0723  WBC 3.6* 2.5* 2.7* 3.4*  HGB 12.8 12.7 14.2 14.5  HCT 42.2 42.4 46.7* 48.8*  PLT 117* 106* 124* 131*  MCV 98.4 99.3 98.1 100.2*  MCH 29.8 29.7 29.8 29.8  MCHC 30.3 30.0 30.4 29.7*  RDW 14.7 14.6 14.1 14.0  LYMPHSABS 0.8 0.8 1.1 1.2  MONOABS 0.3 0.1 0.2 0.4  EOSABS 0.0 0.0 0.0 0.0  BASOSABS 0.0 0.0 0.0 0.0    Chemistries  Recent Labs  Lab 12/23/19 1315 12/24/19 0557 12/25/19 1009 12/26/19 0723 12/27/19 0622  NA 135 139 136 134* 135  K 4.5 4.6 4.7 5.4* 4.0  CL 104 108 104 105 102  CO2 24 24 24 22 25   GLUCOSE 151* 159* 156* 139* 166*  BUN 29* 28* 28* 24* 21  CREATININE 1.16* 0.85 0.84 0.74 0.60  CALCIUM 8.0* 8.0* 8.3* 8.0* 7.9*  MG  --  1.9 2.0 1.9  --   AST 41 34 34 37  --   ALT 18 15 19 18   --   ALKPHOS 60 55 58 51  --  BILITOT 0.5 0.5 0.4 1.2  --    ------------------------------------------------------------------------------------------------------------------ No results for input(s): CHOL, HDL, LDLCALC, TRIG, CHOLHDL, LDLDIRECT  in the last 72 hours.  No results found for: HGBA1C ------------------------------------------------------------------------------------------------------------------ No results for input(s): TSH, T4TOTAL, T3FREE, THYROIDAB in the last 72 hours.  Invalid input(s): FREET3 ------------------------------------------------------------------------------------------------------------------ Recent Labs    12/26/19 0723  FERRITIN 683*    Coagulation profile Recent Labs  Lab 12/24/19 0557 12/25/19 1009 12/26/19 1503 12/27/19 0622 12/28/19 0654  INR 1.9* 2.7* 2.4* 2.7* 2.9*    No results for input(s): DDIMER in the last 72 hours.  Cardiac Enzymes No results for input(s): CKMB, TROPONINI, MYOGLOBIN in the last 168 hours.  Invalid input(s): CK ------------------------------------------------------------------------------------------------------------------ No results found for: BNP   Shon Hale M.D on 12/28/2019 at 1:10 PM  Go to [email or web address] - for contact info  [facility] - Office  [phone]

## 2019-12-28 NOTE — Plan of Care (Signed)
These goals can not be met by patient due to her altered mental status and confusion.  This is patient's baseline.  

## 2019-12-28 NOTE — Progress Notes (Signed)
ANTICOAGULATION CONSULT NOTE -  Pharmacy Consult for warfarin Indication: atrial fibrillation  Allergies  Allergen Reactions  . Contrast Media [Iodinated Diagnostic Agents] Other (See Comments)    "shaking"  . Penicillins Other (See Comments)    Has patient had a PCN reaction causing immediate rash, facial/tongue/throat swelling, SOB or lightheadedness with hypotension: Unknown Has patient had a PCN reaction causing severe rash involving mucus membranes or skin necrosis: Unknown Has patient had a PCN reaction that required hospitalization: Unknown Has patient had a PCN reaction occurring within the last 10 years: Unknown If all of the above answers are "NO", then may proceed with Cephalosporin use.  . Sulfa Antibiotics     Patient Measurements: Height: 5' (152.4 cm) Weight: 153 lb (69.4 kg) IBW/kg (Calculated) : 45.5   Vital Signs: Temp: 97.7 F (36.5 C) (01/30 0459) Temp Source: Oral (01/30 0459) BP: 107/89 (01/30 0459) Pulse Rate: 97 (01/30 0858)  Labs: Recent Labs    12/25/19 1009 12/25/19 1009 12/26/19 0723 12/26/19 1503 12/27/19 0622 12/28/19 0654  HGB 14.2  --  14.5  --   --   --   HCT 46.7*  --  48.8*  --   --   --   PLT 124*  --  131*  --   --   --   LABPROT 28.8*   < >  --  26.0* 28.7* 30.0*  INR 2.7*   < >  --  2.4* 2.7* 2.9*  CREATININE 0.84  --  0.74  --  0.60  --    < > = values in this interval not displayed.    Estimated Creatinine Clearance: 40.7 mL/min (by C-G formula based on SCr of 0.6 mg/dL).   Medical History: Past Medical History:  Diagnosis Date  . Acid reflux   . Atrial fibrillation ([place])   . Atrophic vaginitis   . Chronic cystitis   . Decubitus ulcer of buttock   . Dementia ([place])   . Hyperlipidemia   . Hypertension   . Incomplete bladder emptying   . Microscopic hematuria   . Pacemaker   . Straining on urination   . Symptomatic bradycardia   . Urge incontinence of urine   . UTI (lower urinary tract infection)      Medications:  Medications Prior to Admission  Medication Sig Dispense Refill Last Dose  . cephALEXin (KEFLEX) 250 MG capsule Take 250 mg by mouth at bedtime.   12/22/2019 at Unknown time  . furosemide (LASIX) 40 MG tablet Take 20 mg by mouth daily.    12/23/2019 at Unknown time  . guaifenesin (ROBITUSSIN) 100 MG/5ML syrup Take 200-400 mg by mouth 4 (four) times daily as needed for cough.    12/23/2019 at Unknown time  . loratadine (CLARITIN) 10 MG tablet Take 10 mg by mouth daily as needed for allergies.   12/23/2019 at Unknown time  . metoprolol tartrate (LOPRESSOR) 25 MG tablet Take 1 tablet each morning and 1/2 tablet each evening.  (okay to crush) (Patient taking differently: Take 12.5-25 mg by mouth See admin instructions. Take 1 tablet each morning and 1/2 tablet each evening.  (okay to crush)) 135 tablet 3 12/23/2019 at 1030  . Multiple Vitamins-Minerals (CENTRUM SILVER PO) Take 1 tablet by mouth daily.     12/23/2019 at Unknown time  . omeprazole (PRILOSEC) 20 MG capsule Take 20 mg by mouth every morning.    12/23/2019 at Unknown time  . potassium chloride SA (K-DUR,KLOR-CON) 20 MEQ tablet Take 20 mEq by  mouth every morning.    12/23/2019 at Unknown time  . simvastatin (ZOCOR) 10 MG tablet Take 10 mg by mouth daily.   12/22/2019 at Unknown time  . Travoprost, BAK Free, (TRAVATAN Z) 0.004 % SOLN ophthalmic solution Place 1 drop into both eyes at bedtime.   12/22/2019 at Unknown time  . warfarin (COUMADIN) 3 MG tablet TAKE 1 TABLET DAILY EXCEPT TAKE 1/2 TABLET ON MONDAYS AND THURSDAYS OR AS DIRECTED. (Patient taking differently: Take 1.5-3 mg by mouth See admin instructions. TAKE 1 TABLET DAILY EXCEPT TAKE 1/2 TABLET ON MONDAYS AND THURSDAYS OR AS DIRECTED.) 90 tablet 1 12/22/2019 at 1830  . nitrofurantoin, macrocrystal-monohydrate, (MACROBID) 100 MG capsule Take 1 capsule (100 mg total) by mouth 2 (two) times daily. 14 capsule 0     Assessment: Pharmacy consulted to dose warfarin in patient with  atrial fibrillation.  INR 1.9>> 2.7>2.4>2.7>2.9  Home regimen lsted as 1.5 mg on Mon and Thurs and 3 mg ROW.  Goal of Therapy:  INR 2-3 Monitor platelets by anticoagulation protocol: Yes   Plan:  Warfarin 1mg  x 1 today Monitor labs and s/s of bleeding  , PharmD, MBA, BCGP Clinical Pharmacist  12/28/2019 9:03 AM

## 2019-12-29 LAB — PROTIME-INR
INR: 3.8 — ABNORMAL HIGH (ref 0.8–1.2)
Prothrombin Time: 37.2 seconds — ABNORMAL HIGH (ref 11.4–15.2)

## 2019-12-29 LAB — GLUCOSE, CAPILLARY
Glucose-Capillary: 116 mg/dL — ABNORMAL HIGH (ref 70–99)
Glucose-Capillary: 116 mg/dL — ABNORMAL HIGH (ref 70–99)
Glucose-Capillary: 93 mg/dL (ref 70–99)
Glucose-Capillary: 135 mg/dL — ABNORMAL HIGH (ref 70–99)

## 2019-12-29 NOTE — Progress Notes (Signed)
Lab unable to obtain blood via phlebotomy for updated PT/INR. RN in to assess. IV started left posterior forearm and blood obtained via IV start.

## 2019-12-29 NOTE — Progress Notes (Signed)
Patient Demographics:    [name], is a 86 y.o. female, DOB - 05-24-1927, VXB:[id number]  Admit date - 12/23/2019   Admitting Physician Lance Huaracha Mariea Clonts, MD  Outpatient Primary MD for the patient is Avon Gully, MD  LOS - 6   Chief Complaint  Patient presents with  . Weakness  . Cough        Subjective:    [name] today has no fevers, no emesis,  No chest pain,  - Resting , appears comfortable -Ate a bit better today -medically stable, awaiting possible transfer to SNF rehab   Assessment  & Plan :    Principal Problem:   Acute respiratory disease due to COVID-19 virus Active Problems:   Atrial fibrillation [facility])   Pacemaker - Medtronic 2007, new generator 2014   Long term current use of anticoagulant therapy   Dementia without behavioral disturbance (HCC)   Acute lower UTI--- H/o E coli ESBL  Brief Summary:- 86 y.o. female with past medical history relevant for atrial fibrillation with status post prior pacemaker placement and history of prior ESBL E. coli UTI admitted on 12/22/2018 with COVID-19 pneumonia and poor oral intake -medically stable, awaiting possible transfer to SNF rehab   A/p 1)Acute hypoxic respiratory failure secondary to COVID-19 infection/pneumonia-- --Patient is positive for COVID-19 infection, chest x-ray with findings of infiltrates/opacities, - -completed 5 days of IV remdesivir on 12/27/2019  -Continue Steroid therapy per protocol -started on 12/23/19--- -Hypoxia has resolved --Attempt to maintain euvolemic state --Zinc and vitamin C as ordered -Albuterol inhaler as needed   2)Presumed UTI--- H/o Prior ESBL E. coli UTI--treated empirically with IV Meropenem , cultures were obtained after patient has been on antibiotics for over 24 hours- Treated with meropenem, through 12/26/2019, received fosfomycin 3 g x 1 dose on 12/26/2019 --cultures without  definite growth at this time -No further antibiotics  3) chronic atrial fibrillation/S/p PPM-- coumadin and metoprolol as ordered, INR is  above 3, pharmacy to adjust  4) social/ethics-- patient is a DNR  5) advanced dementia--- patient with significant cognitive difficulties, no significant agitation --Continue routine cares  6)FEN/Hyperkalemia-- -Oral intake remains challenging, treated with Lokelma for hyperkalemia -c/n  Marinol 2.5 mg bid  for appetite stimulation  7) generalized weakness/deconditioning in the setting of advanced dementia with significant cognitive and memory deficits--- physical therapy evaluation appreciated,, spoke with patient's daughter and son-in-law -PTA patient lived alone with caregivers coming for several hours a day -Awaiting possible transfer to SNF rehab  Disposition/Need for in-Hospital Stay- patient unable to be discharged at this time due to ----medically stable, awaiting possible transfer to SNF rehab  Code Status : DNR  Family Communication:    -Called and discussed with patient's daughter and son-in-law  Consults  :  na  DVT Prophylaxis  : Coumadin- SCDs *  Lab Results  Component Value Date   PLT 131 (L) 12/26/2019    Inpatient Medications  Scheduled Meds: . albuterol  2 puff Inhalation TID  . vitamin C  500 mg Oral Daily  . dexamethasone (DECADRON) injection  6 mg Intravenous Q24H  . dronabinol  2.5 mg Oral BID AC  . folic acid  1 mg Oral Daily  . guaiFENesin  600 mg Oral BID  . latanoprost  1 drop Both Eyes QHS  . metoprolol tartrate  12.5 mg Oral QHS  . metoprolol tartrate  25 mg Oral Daily  . multivitamin with minerals  1 tablet Oral Daily  . pantoprazole  40 mg Oral Daily  . QUEtiapine  25 mg Oral QHS  . simvastatin  10 mg Oral Daily  . sodium chloride flush  3 mL Intravenous Q12H  . thiamine  100 mg Oral Daily  . Warfarin - Pharmacist Dosing Inpatient   Does not apply q1800  . zinc sulfate  220 mg Oral Daily    Continuous Infusions: . sodium chloride Stopped (12/25/19 0555)  . dextrose 5 % and 0.45% NaCl 30 mL/hr at 12/29/19 1151   PRN Meds:.sodium chloride, acetaminophen **OR** acetaminophen, chlorpheniramine-HYDROcodone, guaiFENesin-dextromethorphan, ondansetron **OR** ondansetron (ZOFRAN) IV, polyethylene glycol, sodium chloride flush, traZODone    Anti-infectives (From admission, onward)   Start     Dose/Rate Route Frequency Ordered Stop   12/26/19 1800  fosfomycin (MONUROL) packet 3 g     3 g Oral  Once 12/26/19 1016 12/26/19 1613   12/24/19 1500  cefTRIAXone (ROCEPHIN) 1 g in sodium chloride 0.9 % 100 mL IVPB  Status:  Discontinued     1 g 200 mL/hr over 30 Minutes Intravenous Every 24 hours 12/23/19 1507 12/23/19 1727   12/24/19 1000  remdesivir 100 mg in sodium chloride 0.9 % 100 mL IVPB     100 mg 200 mL/hr over 30 Minutes Intravenous Daily 12/23/19 1457 12/27/19 1113   12/23/19 2200  meropenem (MERREM) 1 g in sodium chloride 0.9 % 100 mL IVPB  Status:  Discontinued     1 g 200 mL/hr over 30 Minutes Intravenous Every 12 hours 12/23/19 1727 12/26/19 1016   12/23/19 1530  remdesivir 200 mg in sodium chloride 0.9% 250 mL IVPB     200 mg 580 mL/hr over 30 Minutes Intravenous Once 12/23/19 1457 12/23/19 1830   12/23/19 1500  cefTRIAXone (ROCEPHIN) 1 g in sodium chloride 0.9 % 100 mL IVPB     1 g 200 mL/hr over 30 Minutes Intravenous  Once 12/23/19 1434 12/23/19 1650        Objective:   Vitals:   12/28/19 2100 12/29/19 0539 12/29/19 1015 12/29/19 1359  BP: (!) 151/70 (!) 149/74  135/71  Pulse: 74 69  70  Resp: 18 18  18   Temp: 98.4 F (36.9 C) (!) 97.4 F (36.3 C)  98.1 F (36.7 C)  TempSrc: Axillary Oral  Oral  SpO2: 95% 98% 98% 98%  Weight:      Height:        Wt Readings from Last 3 Encounters:  12/23/19 69.4 kg  01/01/19 69.7 kg  12/29/18 71.7 kg     Intake/Output Summary (Last 24 hours) at 12/29/2019 1755 Last data filed at 12/29/2019 1613 Gross per 24 hour   Intake 1177.18 ml  Output 1300 ml  Net -122.82 ml     Physical Exam  Gen:- Awake Alert, resting HEENT:- [place].AT, No sclera icterus Neck-Supple Neck,No JVD,.  Lungs improved air movement,, no wheezing  CV- S1, S2 normal, regular , left subclavian PPM in situ Abd-  +ve B.Sounds, Abd Soft, No tenderness,    Extremity/Skin:- No  edema, pedal pulses present  Psych- pleasantly confused, disoriented, at baseline patient has severe/significant cognitive and memory deficits Neuro-generalized weakness, no new focal deficits, no tremors   Data Review:   Micro Results Recent Results (from the past 240 hour(s))  Blood Culture (routine x 2)  Status: None   Collection Time: 12/23/19  1:05 PM   Specimen: Right Antecubital; Blood  Result Value Ref Range Status   Specimen Description RIGHT ANTECUBITAL  Final   Special Requests   Final    BOTTLES DRAWN AEROBIC ONLY Blood Culture results may not be optimal due to an inadequate volume of blood received in culture bottles   Culture   Final    NO GROWTH 5 DAYS Performed at [facility], [address], [place], [place] [address]    Report Status 12/28/2019 FINAL  Final  Blood Culture (routine x 2)     Status: None   Collection Time: 12/23/19  1:20 PM   Specimen: Left Antecubital; Blood  Result Value Ref Range Status   Specimen Description LEFT ANTECUBITAL  Final   Special Requests   Final    BOTTLES DRAWN AEROBIC AND ANAEROBIC Blood Culture adequate volume   Culture   Final    NO GROWTH 5 DAYS Performed at [facility], [address] [place], [place] [address]    Report Status 12/28/2019 FINAL  Final  Urine Culture     Status: Abnormal   Collection Time: 12/25/19 12:45 AM   Specimen: Urine, Clean Catch  Result Value Ref Range Status   Specimen Description   Final    URINE, CLEAN CATCH Performed at [facility], [address], [place], [place] [address]    Special Requests   Final    Normal Performed at [facility], [address] [place], [place] [address]    Culture (A)  Final    <10,000 COLONIES/mL INSIGNIFICANT GROWTH Performed at [facility], [address] [place], [place] [address]    Report Status 12/26/2019 FINAL  Final    Radiology Reports DG Chest Portable 1 View  Result Date: 12/23/2019 CLINICAL DATA:  Cough. EXAM: PORTABLE CHEST 1 VIEW COMPARISON:  11/29/2018 and 11/24/2018 FINDINGS: Chronic cardiomegaly with extensive aortic atherosclerosis. Pulmonary vascularity is within normal limits. Slight new atelectasis at the left base. Single tiny area of atelectasis at the right base. Pacemaker in place. Old compression fracture in the lower thoracic spine. IMPRESSION: New slight atelectasis at the left base. Minimal new atelectasis at the right base. Chronic cardiomegaly.  Aortic Atherosclerosis (ICD10-I70.0). Electronically Signed   By: Francene Boyers M.D.   On: 12/23/2019 13:12     CBC Recent Labs  Lab 12/23/19 1315 12/24/19 0557 12/25/19 1009 12/26/19 0723  WBC 3.6* 2.5* 2.7* 3.4*  HGB 12.8 12.7 14.2 14.5  HCT 42.2 42.4 46.7* 48.8*  PLT 117* 106* 124* 131*  MCV 98.4 99.3 98.1 100.2*  MCH 29.8 29.7 29.8 29.8  MCHC 30.3 30.0 30.4 29.7*  RDW 14.7 14.6 14.1 14.0  LYMPHSABS 0.8 0.8 1.1 1.2  MONOABS 0.3 0.1 0.2 0.4  EOSABS 0.0 0.0 0.0 0.0  BASOSABS 0.0 0.0 0.0 0.0    Chemistries  Recent Labs  Lab 12/23/19 1315 12/24/19 0557 12/25/19 1009 12/26/19 0723 12/27/19 0622  NA 135 139 136 134* 135  K 4.5 4.6 4.7 5.4* 4.0  CL 104 108 104 105 102  CO2 24 24 24 22 25   GLUCOSE 151* 159* 156* 139* 166*  BUN 29* 28* 28* 24* 21  CREATININE 1.16* 0.85 0.84 0.74 0.60  CALCIUM 8.0* 8.0* 8.3* 8.0* 7.9*  MG  --  1.9 2.0 1.9  --   AST 41 34 34 37  --   ALT 18 15 19 18   --   ALKPHOS 60 55 58  51  --   BILITOT 0.5 0.5 0.4 1.2  --    ------------------------------------------------------------------------------------------------------------------ No results for input(s): CHOL, HDL, LDLCALC,  TRIG, CHOLHDL, LDLDIRECT in the last 72 hours.  No results found for: HGBA1C ------------------------------------------------------------------------------------------------------------------ No results for input(s): TSH, T4TOTAL, T3FREE, THYROIDAB in the last 72 hours.  Invalid input(s): FREET3 ------------------------------------------------------------------------------------------------------------------ No results for input(s): VITAMINB12, FOLATE, FERRITIN, TIBC, IRON, RETICCTPCT in the last 72 hours.  Coagulation profile Recent Labs  Lab 12/25/19 1009 12/26/19 1503 12/27/19 0622 12/28/19 0654 12/29/19 1231  INR 2.7* 2.4* 2.7* 2.9* 3.8*    No results for input(s): DDIMER in the last 72 hours.  Cardiac Enzymes No results for input(s): CKMB, TROPONINI, MYOGLOBIN in the last 168 hours.  Invalid input(s): CK ------------------------------------------------------------------------------------------------------------------ No results found for: BNP   Roxan Hockey M.D on 12/29/2019 at 5:55 PM  Go to [email or web address] - for contact info  [facility] - Office  [phone]

## 2019-12-29 NOTE — Progress Notes (Signed)
ANTICOAGULATION CONSULT NOTE -  Pharmacy Consult for warfarin Indication: atrial fibrillation  Allergies  Allergen Reactions  . Contrast Media [Iodinated Diagnostic Agents] Other (See Comments)    "shaking"  . Penicillins Other (See Comments)    Has patient had a PCN reaction causing immediate rash, facial/tongue/throat swelling, SOB or lightheadedness with hypotension: Unknown Has patient had a PCN reaction causing severe rash involving mucus membranes or skin necrosis: Unknown Has patient had a PCN reaction that required hospitalization: Unknown Has patient had a PCN reaction occurring within the last 10 years: Unknown If all of the above answers are "NO", then may proceed with Cephalosporin use.  . Sulfa Antibiotics     Patient Measurements: Height: 5' (152.4 cm) Weight: 153 lb (69.4 kg) IBW/kg (Calculated) : 45.5   Vital Signs: Temp: 98.1 F (36.7 C) (01/31 1359) Temp Source: Oral (01/31 1359) BP: 135/71 (01/31 1359) Pulse Rate: 70 (01/31 1359)  Labs: Recent Labs    12/27/19 0622 12/28/19 0654 12/29/19 1231  LABPROT 28.7* 30.0* 37.2*  INR 2.7* 2.9* 3.8*  CREATININE 0.60  --   --     Estimated Creatinine Clearance: 40.7 mL/min (by C-G formula based on SCr of 0.6 mg/dL).   Medical History: Past Medical History:  Diagnosis Date  . Acid reflux   . Atrial fibrillation ([place])   . Atrophic vaginitis   . Chronic cystitis   . Decubitus ulcer of buttock   . Dementia ([place])   . Hyperlipidemia   . Hypertension   . Incomplete bladder emptying   . Microscopic hematuria   . Pacemaker   . Straining on urination   . Symptomatic bradycardia   . Urge incontinence of urine   . UTI (lower urinary tract infection)     Medications:  Medications Prior to Admission  Medication Sig Dispense Refill Last Dose  . cephALEXin (KEFLEX) 250 MG capsule Take 250 mg by mouth at bedtime.   12/22/2019 at Unknown time  . furosemide (LASIX) 40 MG tablet Take 20 mg by mouth daily.     12/23/2019 at Unknown time  . guaifenesin (ROBITUSSIN) 100 MG/5ML syrup Take 200-400 mg by mouth 4 (four) times daily as needed for cough.    12/23/2019 at Unknown time  . loratadine (CLARITIN) 10 MG tablet Take 10 mg by mouth daily as needed for allergies.   12/23/2019 at Unknown time  . metoprolol tartrate (LOPRESSOR) 25 MG tablet Take 1 tablet each morning and 1/2 tablet each evening.  (okay to crush) (Patient taking differently: Take 12.5-25 mg by mouth See admin instructions. Take 1 tablet each morning and 1/2 tablet each evening.  (okay to crush)) 135 tablet 3 12/23/2019 at 1030  . Multiple Vitamins-Minerals (CENTRUM SILVER PO) Take 1 tablet by mouth daily.     12/23/2019 at Unknown time  . omeprazole (PRILOSEC) 20 MG capsule Take 20 mg by mouth every morning.    12/23/2019 at Unknown time  . potassium chloride SA (K-DUR,KLOR-CON) 20 MEQ tablet Take 20 mEq by mouth every morning.    12/23/2019 at Unknown time  . simvastatin (ZOCOR) 10 MG tablet Take 10 mg by mouth daily.   12/22/2019 at Unknown time  . Travoprost, BAK Free, (TRAVATAN Z) 0.004 % SOLN ophthalmic solution Place 1 drop into both eyes at bedtime.   12/22/2019 at Unknown time  . warfarin (COUMADIN) 3 MG tablet TAKE 1 TABLET DAILY EXCEPT TAKE 1/2 TABLET ON MONDAYS AND THURSDAYS OR AS DIRECTED. (Patient taking differently: Take 1.5-3 mg by mouth See  admin instructions. TAKE 1 TABLET DAILY EXCEPT TAKE 1/2 TABLET ON MONDAYS AND THURSDAYS OR AS DIRECTED.) 90 tablet 1 12/22/2019 at 1830  . nitrofurantoin, macrocrystal-monohydrate, (MACROBID) 100 MG capsule Take 1 capsule (100 mg total) by mouth 2 (two) times daily. 14 capsule 0     Assessment: Pharmacy consulted to dose warfarin in patient with atrial fibrillation.  INR 1.9>> 2.7>2.4>2.7>2.9> 3.8  Home regimen lsted as 1.5 mg on Mon and Thurs and 3 mg ROW.  Goal of Therapy:  INR 2-3 Monitor platelets by anticoagulation protocol: Yes   Plan:  Hold warfarin today Monitor labs and s/s of  bleeding  Luan Pulling, PharmD, MBA, BCGP Clinical Pharmacist  12/29/2019 4:50 PM

## 2019-12-29 NOTE — Progress Notes (Signed)
Patient refusing to try to use MDI. She seems unable to follow the instructions for proper use of MDI's.

## 2019-12-29 NOTE — Progress Notes (Signed)
Patient would not take inhaler at this time do to her dementia or confusion . Saturation on room air 94 , f20 lung sounds diminished.

## 2019-12-30 LAB — CBC
HCT: 42.1 % (ref 36.0–46.0)
Hemoglobin: 13.4 g/dL (ref 12.0–15.0)
MCH: 29.3 pg (ref 26.0–34.0)
MCHC: 31.8 g/dL (ref 30.0–36.0)
MCV: 91.9 fL (ref 80.0–100.0)
Platelets: 179 10*3/uL (ref 150–400)
RBC: 4.58 MIL/uL (ref 3.87–5.11)
RDW: 14.2 % (ref 11.5–15.5)
WBC: 3.8 10*3/uL — ABNORMAL LOW (ref 4.0–10.5)
nRBC: 0 % (ref 0.0–0.2)

## 2019-12-30 LAB — BASIC METABOLIC PANEL
Anion gap: 7 (ref 5–15)
BUN: 22 mg/dL (ref 8–23)
CO2: 24 mmol/L (ref 22–32)
Calcium: 8.3 mg/dL — ABNORMAL LOW (ref 8.9–10.3)
Chloride: 104 mmol/L (ref 98–111)
Creatinine, Ser: 0.65 mg/dL (ref 0.44–1.00)
GFR calc Af Amer: >60 mL/min (ref >60)
GFR calc non Af Amer: >60 mL/min (ref >60)
Glucose, Bld: 142 mg/dL — ABNORMAL HIGH (ref 70–99)
Potassium: 4.2 mmol/L (ref 3.5–5.1)
Sodium: 135 mmol/L (ref 135–145)

## 2019-12-30 LAB — PROTIME-INR
INR: 3.7 — ABNORMAL HIGH (ref 0.8–1.2)
Prothrombin Time: 36.6 seconds — ABNORMAL HIGH (ref 11.4–15.2)

## 2019-12-30 LAB — GLUCOSE, CAPILLARY
Glucose-Capillary: 114 mg/dL — ABNORMAL HIGH (ref 70–99)
Glucose-Capillary: 118 mg/dL — ABNORMAL HIGH (ref 70–99)
Glucose-Capillary: 132 mg/dL — ABNORMAL HIGH (ref 70–99)
Glucose-Capillary: 130 mg/dL — ABNORMAL HIGH (ref 70–99)

## 2019-12-30 MED ORDER — ALBUTEROL SULFATE HFA 108 (90 BASE) MCG/ACT IN AERS: 2.0000 | INHALATION_SPRAY | Freq: Four times a day (QID) | RESPIRATORY_TRACT | Status: DC | PRN

## 2019-12-30 NOTE — Progress Notes (Signed)
Patient Demographics:    [name], is a 86 y.o. female, DOB - [birth year], PZW:[id number]  Admit date - 12/23/2019   Admitting Physician Mann Skaggs Mariea Clonts, MD  Outpatient Primary MD for the patient is Avon Gully, MD  LOS - 7   Chief Complaint  Patient presents with  . Weakness  . Cough        Subjective:    [name] today has no fevers, no emesis,  No chest pain,  - Resting , appears comfortable -appetite is improving -medically stable, awaiting possible transfer to SNF rehab   Assessment  & Plan :    Principal Problem:   Acute respiratory disease due to COVID-19 virus Active Problems:   Atrial fibrillation [facility])   Pacemaker - Medtronic 2007, new generator 2014   Long term current use of anticoagulant therapy   Dementia without behavioral disturbance (HCC)   Acute lower UTI--- H/o E coli ESBL  Brief Summary:- 86 y.o. female with past medical history relevant for atrial fibrillation with status post prior pacemaker placement and history of prior ESBL E. coli UTI admitted on 12/22/2018 with COVID-19 pneumonia and poor oral intake -medically stable, awaiting possible transfer to SNF rehab   A/p 1)Acute hypoxic respiratory failure secondary to COVID-19 infection/pneumonia-- --Patient is positive for COVID-19 infection, chest x-ray with findings of infiltrates/opacities, - -completed 5 days of IV remdesivir on 12/27/2019  -Continue Steroid therapy per protocol -started on 12/23/19---for total of 10 days -Hypoxia has resolved --Attempt to maintain euvolemic state --Zinc and vitamin C as ordered -Albuterol inhaler as needed   2)Presumed UTI--- H/o Prior ESBL E. coli UTI--treated empirically with IV Meropenem , cultures were obtained after patient has been on antibiotics for over 24 hours- Treated with meropenem, through 12/26/2019, received fosfomycin 3 g x 1 dose on 12/26/2019  --cultures without definite growth at this time -No further antibiotics  3) chronic atrial fibrillation/S/p PPM-- coumadin and metoprolol as ordered, INR is  above 3, pharmacy to adjust  4) social/ethics-- patient is a DNR  5) advanced dementia--- patient with significant cognitive difficulties, no significant agitation --Continue routine cares  6)FEN/Hyperkalemia-- -Oral intake remains challenging, treated with Lokelma for hyperkalemia -Potassium has normalized at 4.2, -Appetite is improving on Marinol 2.5 mg bid    7) generalized weakness/deconditioning in the setting of advanced dementia with significant cognitive and memory deficits--- physical therapy evaluation appreciated,, spoke with patient's daughter and son-in-law -PTA patient lived alone with caregivers coming for several hours a day -Awaiting  transfer to SNF rehab  Disposition/Need for in-Hospital Stay- patient unable to be discharged at this time due to ----medically stable, awaiting possible transfer to SNF rehab -Apparently SNF bed not available until 12/31/2019  Code Status : DNR  Family Communication:    -Called and discussed with patient's daughter and son-in-law again on 12/30/2019  Consults  :  na  DVT Prophylaxis  : Coumadin- SCDs *  Lab Results  Component Value Date   PLT 179 12/30/2019    Inpatient Medications  Scheduled Meds: . vitamin C  500 mg Oral Daily  . dexamethasone (DECADRON) injection  6 mg Intravenous Q24H  . dronabinol  2.5 mg Oral BID AC  . folic acid  1 mg Oral Daily  . guaiFENesin  600 mg Oral BID  . latanoprost  1 drop Both Eyes QHS  . metoprolol tartrate  12.5 mg Oral QHS  . metoprolol tartrate  25 mg Oral Daily  . multivitamin with minerals  1 tablet Oral Daily  . pantoprazole  40 mg Oral Daily  . QUEtiapine  25 mg Oral QHS  . simvastatin  10 mg Oral Daily  . sodium chloride flush  3 mL Intravenous Q12H  . thiamine  100 mg Oral Daily  . Warfarin - Pharmacist Dosing Inpatient    Does not apply q1800  . zinc sulfate  220 mg Oral Daily   Continuous Infusions: . sodium chloride Stopped (12/25/19 0555)  . dextrose 5 % and 0.45% NaCl 10 mL/hr (12/29/19 2130)   PRN Meds:.sodium chloride, acetaminophen **OR** acetaminophen, albuterol, chlorpheniramine-HYDROcodone, guaiFENesin-dextromethorphan, ondansetron **OR** ondansetron (ZOFRAN) IV, polyethylene glycol, sodium chloride flush, traZODone    Anti-infectives (From admission, onward)   Start     Dose/Rate Route Frequency Ordered Stop   12/26/19 1800  fosfomycin (MONUROL) packet 3 g     3 g Oral  Once 12/26/19 1016 12/26/19 1613   12/24/19 1500  cefTRIAXone (ROCEPHIN) 1 g in sodium chloride 0.9 % 100 mL IVPB  Status:  Discontinued     1 g 200 mL/hr over 30 Minutes Intravenous Every 24 hours 12/23/19 1507 12/23/19 1727   12/24/19 1000  remdesivir 100 mg in sodium chloride 0.9 % 100 mL IVPB     100 mg 200 mL/hr over 30 Minutes Intravenous Daily 12/23/19 1457 12/27/19 1113   12/23/19 2200  meropenem (MERREM) 1 g in sodium chloride 0.9 % 100 mL IVPB  Status:  Discontinued     1 g 200 mL/hr over 30 Minutes Intravenous Every 12 hours 12/23/19 1727 12/26/19 1016   12/23/19 1530  remdesivir 200 mg in sodium chloride 0.9% 250 mL IVPB     200 mg 580 mL/hr over 30 Minutes Intravenous Once 12/23/19 1457 12/23/19 1830   12/23/19 1500  cefTRIAXone (ROCEPHIN) 1 g in sodium chloride 0.9 % 100 mL IVPB     1 g 200 mL/hr over 30 Minutes Intravenous  Once 12/23/19 1434 12/23/19 1650        Objective:   Vitals:   12/29/19 2002 12/29/19 2128 12/30/19 0607 12/30/19 1502  BP:  (!) 150/58 (!) 155/62 (!) 132/42  Pulse:  68 69 62  Resp:  18 20 16   Temp:  98.4 F (36.9 C) 97.8 F (36.6 C) 97.7 F (36.5 C)  TempSrc:  Oral Oral Oral  SpO2: 95% 96% 99% 96%  Weight:      Height:        Wt Readings from Last 3 Encounters:  12/23/19 69.4 kg  01/01/19 69.7 kg  12/29/18 71.7 kg     Intake/Output Summary (Last 24 hours) at  12/30/2019 1713 Last data filed at 12/30/2019 1200 Gross per 24 hour  Intake 765.57 ml  Output 950 ml  Net -184.43 ml     Physical Exam  Gen:- Awake Alert, resting HEENT:- [place].AT, No sclera icterus Neck-Supple Neck,No JVD,.  Lungs improved air movement,, no wheezing  CV- S1, S2 normal, regular , left subclavian PPM in situ Abd-  +ve B.Sounds, Abd Soft, No tenderness,    Extremity/Skin:- No  edema, pedal pulses present  Psych- pleasantly confused, disoriented, at baseline patient has severe/significant cognitive and memory deficits -Cooperative Neuro-generalized weakness, no new focal deficits, no tremors   Data Review:   Micro Results Recent Results (from the  past 240 hour(s))  Blood Culture (routine x 2)     Status: None   Collection Time: 12/23/19  1:05 PM   Specimen: Right Antecubital; Blood  Result Value Ref Range Status   Specimen Description RIGHT ANTECUBITAL  Final   Special Requests   Final    BOTTLES DRAWN AEROBIC ONLY Blood Culture results may not be optimal due to an inadequate volume of blood received in culture bottles   Culture   Final    NO GROWTH 5 DAYS Performed at [facility], [address] [place], [place] [address]    Report Status 12/28/2019 FINAL  Final  Blood Culture (routine x 2)     Status: None   Collection Time: 12/23/19  1:20 PM   Specimen: Left Antecubital; Blood  Result Value Ref Range Status   Specimen Description LEFT ANTECUBITAL  Final   Special Requests   Final    BOTTLES DRAWN AEROBIC AND ANAEROBIC Blood Culture adequate volume   Culture   Final    NO GROWTH 5 DAYS Performed at [facility], [address], [place], [place] [address]    Report Status 12/28/2019 FINAL  Final  Urine Culture     Status: Abnormal   Collection Time: 12/25/19 12:45 AM   Specimen: Urine, Clean Catch  Result Value Ref Range Status   Specimen Description   Final    URINE, CLEAN CATCH Performed at [facility], [address], [place], [place] [address]     Special Requests   Final    Normal Performed at [facility], [address] [place], [place] [address]    Culture (A)  Final    <10,000 COLONIES/mL INSIGNIFICANT GROWTH Performed at [facility], [address], [place], [place] [address]    Report Status 12/26/2019 FINAL  Final    Radiology Reports DG Chest Portable 1 View  Result Date: 12/23/2019 CLINICAL DATA:  Cough. EXAM: PORTABLE CHEST 1 VIEW COMPARISON:  11/29/2018 and 11/24/2018 FINDINGS: Chronic cardiomegaly with extensive aortic atherosclerosis. Pulmonary vascularity is within normal limits. Slight new atelectasis at the left base. Single tiny area of atelectasis at the right base. Pacemaker in place. Old compression fracture in the lower thoracic spine. IMPRESSION: New slight atelectasis at the left base. Minimal new atelectasis at the right base. Chronic cardiomegaly.  Aortic Atherosclerosis (ICD10-I70.0). Electronically Signed   By: Francene Boyers M.D.   On: 12/23/2019 13:12     CBC Recent Labs  Lab 12/24/19 0557 12/25/19 1009 12/26/19 0723 12/30/19 0508  WBC 2.5* 2.7* 3.4* 3.8*  HGB 12.7 14.2 14.5 13.4  HCT 42.4 46.7* 48.8* 42.1  PLT 106* 124* 131* 179  MCV 99.3 98.1 100.2* 91.9  MCH 29.7 29.8 29.8 29.3  MCHC 30.0 30.4 29.7* 31.8  RDW 14.6 14.1 14.0 14.2  LYMPHSABS 0.8 1.1 1.2  --   MONOABS 0.1 0.2 0.4  --   EOSABS 0.0 0.0 0.0  --   BASOSABS 0.0 0.0 0.0  --     Chemistries  Recent Labs  Lab 12/24/19 0557 12/25/19 1009 12/26/19 0723 12/27/19 0622 12/30/19 0508  NA 139 136 134* 135 135  K 4.6 4.7 5.4* 4.0 4.2  CL 108 104 105 102 104  CO2 24 24 22 25 24   GLUCOSE 159* 156* 139* 166* 142*  BUN 28* 28* 24* 21 22  CREATININE 0.85 0.84 0.74 0.60 0.65  CALCIUM 8.0* 8.3* 8.0* 7.9* 8.3*  MG 1.9 2.0 1.9  --   --   AST  34 34 37  --   --   ALT 15 19 18   --   --   ALKPHOS 55 58 51  --   --   BILITOT 0.5 0.4 1.2  --   --     ------------------------------------------------------------------------------------------------------------------ No results for input(s): CHOL, HDL, LDLCALC, TRIG, CHOLHDL, LDLDIRECT in the last 72 hours.  No results found for: HGBA1C ------------------------------------------------------------------------------------------------------------------ No results for input(s): TSH, T4TOTAL, T3FREE, THYROIDAB in the last 72 hours.  Invalid input(s): FREET3 ------------------------------------------------------------------------------------------------------------------ No results for input(s): VITAMINB12, FOLATE, FERRITIN, TIBC, IRON, RETICCTPCT in the last 72 hours.  Coagulation profile Recent Labs  Lab 12/26/19 1503 12/27/19 0622 12/28/19 0654 12/29/19 1231 12/30/19 0508  INR 2.4* 2.7* 2.9* 3.8* 3.7*    No results for input(s): DDIMER in the last 72 hours.  Cardiac Enzymes No results for input(s): CKMB, TROPONINI, MYOGLOBIN in the last 168 hours.  Invalid input(s): CK ------------------------------------------------------------------------------------------------------------------ No results found for: BNP   02/27/20 M.D on 12/30/2019 at 5:13 PM  Go to [email or web address] - for contact info  [facility] - Office  [phone]

## 2019-12-30 NOTE — Progress Notes (Signed)
ANTICOAGULATION CONSULT NOTE -  Pharmacy Consult for warfarin Indication: atrial fibrillation  Allergies  Allergen Reactions  . Contrast Media [Iodinated Diagnostic Agents] Other (See Comments)    "shaking"  . Penicillins Other (See Comments)    Has patient had a PCN reaction causing immediate rash, facial/tongue/throat swelling, SOB or lightheadedness with hypotension: Unknown Has patient had a PCN reaction causing severe rash involving mucus membranes or skin necrosis: Unknown Has patient had a PCN reaction that required hospitalization: Unknown Has patient had a PCN reaction occurring within the last 10 years: Unknown If all of the above answers are "NO", then may proceed with Cephalosporin use.  . Sulfa Antibiotics     Patient Measurements: Height: 5' (152.4 cm) Weight: 153 lb (69.4 kg) IBW/kg (Calculated) : 45.5   Vital Signs: Temp: 97.8 F (36.6 C) (02/01 0607) Temp Source: Oral (02/01 0607) BP: 155/62 (02/01 0607) Pulse Rate: 69 (02/01 0607)  Labs: Recent Labs    12/28/19 0654 12/29/19 1231 12/30/19 0508  HGB  --   --  13.4  HCT  --   --  42.1  PLT  --   --  179  LABPROT 30.0* 37.2* 36.6*  INR 2.9* 3.8* 3.7*  CREATININE  --   --  0.65    Estimated Creatinine Clearance: 40.7 mL/min (by C-G formula based on SCr of 0.65 mg/dL).   Medical History: Past Medical History:  Diagnosis Date  . Acid reflux   . Atrial fibrillation (HCC)   . Atrophic vaginitis   . Chronic cystitis   . Decubitus ulcer of buttock   . Dementia (HCC)   . Hyperlipidemia   . Hypertension   . Incomplete bladder emptying   . Microscopic hematuria   . Pacemaker   . Straining on urination   . Symptomatic bradycardia   . Urge incontinence of urine   . UTI (lower urinary tract infection)     Medications:  Medications Prior to Admission  Medication Sig Dispense Refill Last Dose  . cephALEXin (KEFLEX) 250 MG capsule Take 250 mg by mouth at bedtime.   12/22/2019 at Unknown time  .  furosemide (LASIX) 40 MG tablet Take 20 mg by mouth daily.    12/23/2019 at Unknown time  . guaifenesin (ROBITUSSIN) 100 MG/5ML syrup Take 200-400 mg by mouth 4 (four) times daily as needed for cough.    12/23/2019 at Unknown time  . loratadine (CLARITIN) 10 MG tablet Take 10 mg by mouth daily as needed for allergies.   12/23/2019 at Unknown time  . metoprolol tartrate (LOPRESSOR) 25 MG tablet Take 1 tablet each morning and 1/2 tablet each evening.  (okay to crush) (Patient taking differently: Take 12.5-25 mg by mouth See admin instructions. Take 1 tablet each morning and 1/2 tablet each evening.  (okay to crush)) 135 tablet 3 12/23/2019 at 1030  . Multiple Vitamins-Minerals (CENTRUM SILVER PO) Take 1 tablet by mouth daily.     12/23/2019 at Unknown time  . omeprazole (PRILOSEC) 20 MG capsule Take 20 mg by mouth every morning.    12/23/2019 at Unknown time  . potassium chloride SA (K-DUR,KLOR-CON) 20 MEQ tablet Take 20 mEq by mouth every morning.    12/23/2019 at Unknown time  . simvastatin (ZOCOR) 10 MG tablet Take 10 mg by mouth daily.   12/22/2019 at Unknown time  . Travoprost, BAK Free, (TRAVATAN Z) 0.004 % SOLN ophthalmic solution Place 1 drop into both eyes at bedtime.   12/22/2019 at Unknown time  . warfarin (COUMADIN)  3 MG tablet TAKE 1 TABLET DAILY EXCEPT TAKE 1/2 TABLET ON MONDAYS AND THURSDAYS OR AS DIRECTED. (Patient taking differently: Take 1.5-3 mg by mouth See admin instructions. TAKE 1 TABLET DAILY EXCEPT TAKE 1/2 TABLET ON MONDAYS AND THURSDAYS OR AS DIRECTED.) 90 tablet 1 12/22/2019 at 1830  . nitrofurantoin, macrocrystal-monohydrate, (MACROBID) 100 MG capsule Take 1 capsule (100 mg total) by mouth 2 (two) times daily. 14 capsule 0     Assessment: Pharmacy consulted to dose warfarin in patient with atrial fibrillation.  INR 1.9>> 2.7>2.4>2.7>2.9> 3.8>3.7  Home regimen lsted as 1.5 mg on Mon and Thurs and 3 mg ROW.  Dosing will likely need to lower than indicated home dose due to  supratherapeutic INR's  Goal of Therapy:  INR 2-3 Monitor platelets by anticoagulation protocol: Yes   Plan:  Hold warfarin today Monitor labs and s/s of bleeding  Margot Ables, PharmD Clinical Pharmacist 12/30/2019 8:31 AM

## 2019-12-30 NOTE — Clinical Social Work Note (Addendum)
[name] with [facility], confirms bed offer and can admit patient on 12/31/19. They have no bed availability today. [facility] is no longer taking COVID+ patients as of today. [facility] made offer but did not have bed available today and did not express when bed would be available.  Insurance Berkley Harvey is waived through 01/26/20, per [employer].  Discussed bed offer with daughter, [name], reviewed Medicare rating from [facility] website. [name] will consider this bed offer and return call to LCSW.   Bethani Brugger, Juleen China, LCSW

## 2019-12-30 NOTE — Progress Notes (Signed)
Physical Therapy Treatment Patient Details Name: [name] MRN: [id number] DOB: 07-May-1927 Today's Date: 12/30/2019    History of Present Illness [name]  is a 86 y.o. female with past medical history relevant for atrial fibrillation with status post prior pacemaker placement and history of prior ESBL E. coli UTI on underlying dementia presents to the ED after a fall    PT Comments    Patient requires much verbal/tactile cueing to participate with functional activity due to extreme fear of falling.  Patient slightly more cooperative when redirected to tasks, completed 1 partial sit to stand with Min/mod assist, but refused to completely stand and later required 2 person hand held assist and use of RW for patient to transfer to chair.  Patient tolerated sitting up in chair with NT in room to help feed patient.  Patient will benefit from continued physical therapy in hospital and recommended venue below to increase strength, balance, endurance for safe ADLs and gait.   Follow Up Recommendations  SNF;Supervision for mobility/OOB;Supervision/Assistance - 24 hour     Equipment Recommendations  None recommended by PT    Recommendations for Other Services       Precautions / Restrictions Precautions Precautions: Fall Restrictions Weight Bearing Restrictions: No    Mobility  Bed Mobility Overal bed mobility: Needs Assistance Bed Mobility: Supine to Sit     Supine to sit: Mod assist;Max assist     General bed mobility comments: slow labored movement  Transfers Overall transfer level: Needs assistance Equipment used: Rolling walker (2 wheeled);2 person hand held assist Transfers: Sit to/from Stand;Stand Pivot Transfers Sit to Stand: Max assist Stand pivot transfers: Max assist;+2 safety/equipment       General transfer comment: Patient requried 2 person hand held assist by supporting under arms while patient holding onto RW, very slow labored  movement  Ambulation/Gait Ambulation/Gait assistance: Max assist;+2 safety/equipment Gait Distance (Feet): 3 Feet Assistive device: Rolling walker (2 wheeled) Gait Pattern/deviations: Decreased step length - right;Decreased step length - left;Decreased stride length Gait velocity: slow   General Gait Details: limited to 3-4 very short unsteady shuffling steps using RW, but supported mostly by helpers   Social research officer, government [employer] (Stroke Patients Only)       Balance Overall balance assessment: Needs assistance Sitting-balance support: Feet supported;No upper extremity supported Sitting balance-Leahy Scale: Fair Sitting balance - Comments: fair/good seated at EOB   Standing balance support: During functional activity;Bilateral upper extremity supported Standing balance-Leahy Scale: Poor Standing balance comment: using RW and 2 person hand held assistance                            Cognition Arousal/Alertness: Awake/alert Behavior During Therapy: Anxious Overall Cognitive Status: History of cognitive impairments - at baseline                                        Exercises      General Comments        Pertinent Vitals/Pain Pain Assessment: No/denies pain    Home Living                      Prior Function            PT Goals (current goals can  now be found in the care plan section) Acute Rehab PT Goals Patient Stated Goal: return home PT Goal Formulation: With patient Time For Goal Achievement: 01/10/20 Potential to Achieve Goals: Good Progress towards PT goals: Progressing toward goals    Frequency    Min 3X/week      PT Plan Current plan remains appropriate    Co-evaluation              AM-PAC PT "6 Clicks" Mobility   Outcome Measure  Help needed turning from your back to your side while in a flat bed without using bedrails?: A Lot Help needed moving from lying  on your back to sitting on the side of a flat bed without using bedrails?: A Lot Help needed moving to and from a bed to a chair (including a wheelchair)?: A Lot Help needed standing up from a chair using your arms (e.g., wheelchair or bedside chair)?: A Lot Help needed to walk in hospital room?: A Lot Help needed climbing 3-5 steps with a railing? : Total 6 Click Score: 11    End of Session Equipment Utilized During Treatment: Gait belt Activity Tolerance: Patient tolerated treatment well;Patient limited by fatigue;Treatment limited secondary to agitation Patient left: in chair;with call bell/phone within reach;with chair alarm set;with nursing/sitter in room Nurse Communication: Mobility status PT Visit Diagnosis: Unsteadiness on feet (R26.81);Other abnormalities of gait and mobility (R26.89);Muscle weakness (generalized) (M62.81)     Time: 2831-5176 PT Time Calculation (min) (ACUTE ONLY): 22 min  Charges:  $Therapeutic Activity: 8-22 mins                     2:10 PM, 12/30/19 Lonell Grandchild, MPT Physical Therapist with [facility] [phone] office [phone] mobile phone

## 2019-12-31 LAB — GLUCOSE, CAPILLARY: Glucose-Capillary: 96 mg/dL (ref 70–99)

## 2019-12-31 LAB — PROTIME-INR
INR: 3.2 — ABNORMAL HIGH (ref 0.8–1.2)
Prothrombin Time: 32.5 seconds — ABNORMAL HIGH (ref 11.4–15.2)

## 2019-12-31 MED ORDER — DEXAMETHASONE 4 MG PO TABS
6.0000 mg | ORAL_TABLET | Freq: Every day | ORAL | Status: AC
  Administered 2020-01-01 – 2020-01-02 (×2): 6 mg via ORAL
  Filled 2019-12-31 (×2): qty 2

## 2019-12-31 MED ORDER — DRONABINOL 2.5 MG PO CAPS: 2.5000 mg | ORAL_CAPSULE | Freq: Two times a day (BID) | ORAL | 0 refills | Status: AC

## 2019-12-31 MED ORDER — GUAIFENESIN ER 600 MG PO TB12: 600.0000 mg | ORAL_TABLET | Freq: Two times a day (BID) | ORAL | 0 refills | Status: AC

## 2019-12-31 MED ORDER — THIAMINE HCL 100 MG PO TABS: 100.0000 mg | ORAL_TABLET | Freq: Every day | ORAL | 1 refills | Status: AC

## 2019-12-31 MED ORDER — ALBUTEROL SULFATE HFA 108 (90 BASE) MCG/ACT IN AERS: 2.0000 | INHALATION_SPRAY | RESPIRATORY_TRACT | 0 refills | Status: AC | PRN

## 2019-12-31 MED ORDER — METOPROLOL TARTRATE 25 MG PO TABS: 12.5000 mg | ORAL_TABLET | Freq: Two times a day (BID) | ORAL | 3 refills | Status: AC

## 2019-12-31 MED ORDER — ASCORBIC ACID 500 MG PO TABS: 500.0000 mg | ORAL_TABLET | Freq: Every day | ORAL | 0 refills | Status: AC

## 2019-12-31 MED ORDER — ACETAMINOPHEN 325 MG PO TABS: 650.0000 mg | ORAL_TABLET | Freq: Four times a day (QID) | ORAL | 2 refills | Status: AC | PRN

## 2019-12-31 MED ORDER — FOLIC ACID 1 MG PO TABS: 1.0000 mg | ORAL_TABLET | Freq: Every day | ORAL | 0 refills | Status: AC

## 2019-12-31 MED ORDER — ZINC SULFATE 220 (50 ZN) MG PO CAPS: 220.0000 mg | ORAL_CAPSULE | Freq: Every day | ORAL | 1 refills | Status: AC

## 2019-12-31 MED ORDER — QUETIAPINE FUMARATE 25 MG PO TABS: 25.0000 mg | ORAL_TABLET | Freq: Every day | ORAL | 0 refills | Status: AC

## 2019-12-31 NOTE — Progress Notes (Signed)
Patient Demographics:    [name], is a 86 y.o. female, DOB - 1927-06-21, VVO:[id number]  Admit date - 12/23/2019   Admitting Physician Dakhari Zuver Mariea Clonts, MD  Outpatient Primary MD for the patient is Avon Gully, MD  LOS - 8   Chief Complaint  Patient presents with  . Weakness  . Cough        Subjective:    [name] today has no fevers, no emesis,  No chest pain,  - Resting , appears comfortable -appetite has improved -No new medical concerns -medically stable, patient's daughter refusing to allow patient to go to SNF rehab . -Patient's daughter also refusing to take patient home with home health services  - Patient remains medically stable -Patient is not requiring any hospital/inpatient level care at this time   Assessment  & Plan :    Principal Problem:   Acute respiratory disease due to COVID-19 virus Active Problems:   Atrial fibrillation [facility])   Pacemaker - Medtronic 2007, new generator 2014   Long term current use of anticoagulant therapy   Dementia without behavioral disturbance (HCC)   Acute lower UTI--- H/o E coli ESBL  Brief Summary:- 86 y.o. female with past medical history relevant for atrial fibrillation with status post prior pacemaker placement and history of prior ESBL E. coli UTI admitted on 12/22/2018 with COVID-19 pneumonia and poor oral intake medically stable, patient's daughter refusing to allow patient to go to SNF rehab . -Patient's daughter also refusing to take patient home with home health services    A/p 1)Acute hypoxic respiratory failure secondary to COVID-19 infection/pneumonia-- --Patient is positive for COVID-19 infection, chest x-ray with findings of infiltrates/opacities, - -completed 5 days of IV remdesivir on 12/27/2019  -Okay to complete steroid therapy per protocol -started on 12/23/19---for total of 10 days -Hypoxia has resolved --Attempt  to maintain euvolemic state --Zinc and vitamin C as ordered -Albuterol inhaler as needed -   2)Presumed UTI--- H/o Prior ESBL E. coli UTI--treated empirically with IV Meropenem , cultures were obtained after patient has been on antibiotics for over 24 hours- Treated with meropenem, through 12/26/2019, received fosfomycin 3 g x 1 dose on 12/26/2019 --cultures without definite growth at this time -No further antibiotics  3) chronic atrial fibrillation/S/p PPM-- coumadin and metoprolol as ordered, INR is  above 3, pharmacy to adjust  4) social/ethics-- patient is a DNR  5) advanced dementia--- patient with significant cognitive difficulties, no significant agitation --Continue routine cares  6)FEN/Hyperkalemia-- -Oral intake has improved -Previously treated with Lokelma for hyperkalemia -Potassium has normalized at 4.2, -Appetite is improving on Marinol 2.5 mg bid    7) generalized weakness/deconditioning in the setting of advanced dementia with significant cognitive and memory deficits--- physical therapy evaluation appreciated,, spoke with patient's daughter and son-in-law -PTA patient lived alone with caregivers coming for several hours a day -medically stable, patient's daughter refusing to allow patient to go to SNF rehab . -Patient's daughter also refusing to take patient home with home health services   Disposition/Need for in-Hospital Stay- patient unable to be discharged at this time due to ----Resting , appears comfortable -appetite has improved -No new medical concerns -medically stable, patient's daughter refusing to allow patient to go to SNF rehab . -Patient's daughter  also refusing to take patient home with home health services  - Patient remains medically stable -Patient is not requiring any hospital/inpatient level care at this time Code Status : DNR  Family Communication:    -Called and discussed with patient's daughter and son-in-law again  Consults  :   na  DVT Prophylaxis  : Coumadin- SCDs *  Lab Results  Component Value Date   PLT 179 12/30/2019    Inpatient Medications  Scheduled Meds: . vitamin C  500 mg Oral Daily  . [START ON 01/01/2020] dexamethasone  6 mg Oral Daily  . dronabinol  2.5 mg Oral BID AC  . folic acid  1 mg Oral Daily  . guaiFENesin  600 mg Oral BID  . latanoprost  1 drop Both Eyes QHS  . metoprolol tartrate  12.5 mg Oral QHS  . metoprolol tartrate  25 mg Oral Daily  . multivitamin with minerals  1 tablet Oral Daily  . pantoprazole  40 mg Oral Daily  . QUEtiapine  25 mg Oral QHS  . simvastatin  10 mg Oral Daily  . sodium chloride flush  3 mL Intravenous Q12H  . thiamine  100 mg Oral Daily  . Warfarin - Pharmacist Dosing Inpatient   Does not apply q1800  . zinc sulfate  220 mg Oral Daily   Continuous Infusions: . sodium chloride Stopped (12/25/19 0555)  . dextrose 5 % and 0.45% NaCl 10 mL/hr (12/29/19 2130)   PRN Meds:.sodium chloride, acetaminophen **OR** acetaminophen, albuterol, chlorpheniramine-HYDROcodone, guaiFENesin-dextromethorphan, ondansetron **OR** ondansetron (ZOFRAN) IV, polyethylene glycol, sodium chloride flush, traZODone    Anti-infectives (From admission, onward)   Start     Dose/Rate Route Frequency Ordered Stop   12/26/19 1800  fosfomycin (MONUROL) packet 3 g     3 g Oral  Once 12/26/19 1016 12/26/19 1613   12/24/19 1500  cefTRIAXone (ROCEPHIN) 1 g in sodium chloride 0.9 % 100 mL IVPB  Status:  Discontinued     1 g 200 mL/hr over 30 Minutes Intravenous Every 24 hours 12/23/19 1507 12/23/19 1727   12/24/19 1000  remdesivir 100 mg in sodium chloride 0.9 % 100 mL IVPB     100 mg 200 mL/hr over 30 Minutes Intravenous Daily 12/23/19 1457 12/27/19 1113   12/23/19 2200  meropenem (MERREM) 1 g in sodium chloride 0.9 % 100 mL IVPB  Status:  Discontinued     1 g 200 mL/hr over 30 Minutes Intravenous Every 12 hours 12/23/19 1727 12/26/19 1016   12/23/19 1530  remdesivir 200 mg in sodium  chloride 0.9% 250 mL IVPB     200 mg 580 mL/hr over 30 Minutes Intravenous Once 12/23/19 1457 12/23/19 1830   12/23/19 1500  cefTRIAXone (ROCEPHIN) 1 g in sodium chloride 0.9 % 100 mL IVPB     1 g 200 mL/hr over 30 Minutes Intravenous  Once 12/23/19 1434 12/23/19 1650        Objective:   Vitals:   12/30/19 0607 12/30/19 1502 12/30/19 2054 12/31/19 0400  BP: (!) 155/62 (!) 132/42 (!) 143/48 (!) 157/58  Pulse: 69 62 (!) 45 81  Resp: 20 16 20 18   Temp: 97.8 F (36.6 C) 97.7 F (36.5 C) 97.6 F (36.4 C) 98.7 F (37.1 C)  TempSrc: Oral Oral Oral Oral  SpO2: 99% 96% 95%   Weight:      Height:        Wt Readings from Last 3 Encounters:  12/23/19 69.4 kg  01/01/19 69.7 kg  12/29/18 71.7  kg     Intake/Output Summary (Last 24 hours) at 12/31/2019 1058 Last data filed at 12/31/2019 0400 Gross per 24 hour  Intake 392.88 ml  Output 1000 ml  Net -607.12 ml     Physical Exam  Gen:- Awake Alert, resting comfortably HEENT:- [place].AT, No sclera icterus Neck-Supple Neck,No JVD,.  Lungs improved air movement,, no wheezing  CV- S1, S2 normal, regular , left subclavian PPM in situ Abd-  +ve B.Sounds, Abd Soft, No tenderness,    Extremity/Skin:- No  edema, pedal pulses present  Psych- pleasantly confused, disoriented, at baseline patient has severe/significant cognitive and memory deficits -Cooperative Neuro-generalized weakness, no new focal deficits, no tremors   Data Review:   Micro Results Recent Results (from the past 240 hour(s))  Blood Culture (routine x 2)     Status: None   Collection Time: 12/23/19  1:05 PM   Specimen: Right Antecubital; Blood  Result Value Ref Range Status   Specimen Description RIGHT ANTECUBITAL  Final   Special Requests   Final    BOTTLES DRAWN AEROBIC ONLY Blood Culture results may not be optimal due to an inadequate volume of blood received in culture bottles   Culture   Final    NO GROWTH 5 DAYS Performed at [facility], [address],  [place], [place] [address]    Report Status 12/28/2019 FINAL  Final  Blood Culture (routine x 2)     Status: None   Collection Time: 12/23/19  1:20 PM   Specimen: Left Antecubital; Blood  Result Value Ref Range Status   Specimen Description LEFT ANTECUBITAL  Final   Special Requests   Final    BOTTLES DRAWN AEROBIC AND ANAEROBIC Blood Culture adequate volume   Culture   Final    NO GROWTH 5 DAYS Performed at [facility], [address] [place], [place] [address]    Report Status 12/28/2019 FINAL  Final  Urine Culture     Status: Abnormal   Collection Time: 12/25/19 12:45 AM   Specimen: Urine, Clean Catch  Result Value Ref Range Status   Specimen Description   Final    URINE, CLEAN CATCH Performed at [facility], [address] [place], [place] [address]    Special Requests   Final    Normal Performed at [facility], [address], [place], [place] [address]    Culture (A)  Final    <10,000 COLONIES/mL INSIGNIFICANT GROWTH Performed at [facility], [place] [address] [place],  [address]    Report Status 12/26/2019 FINAL  Final    Radiology Reports DG Chest Portable 1 View  Result Date: 12/23/2019 CLINICAL DATA:  Cough. EXAM: PORTABLE CHEST 1 VIEW COMPARISON:  11/29/2018 and 11/24/2018 FINDINGS: Chronic cardiomegaly with extensive aortic atherosclerosis. Pulmonary vascularity is within normal limits. Slight new atelectasis at the left base. Single tiny area of atelectasis at the right base. Pacemaker in place. Old compression fracture in the lower thoracic spine. IMPRESSION: New slight atelectasis at the left base. Minimal new atelectasis at the right base. Chronic cardiomegaly.  Aortic Atherosclerosis (ICD10-I70.0). Electronically Signed   By: Lorriane Shire M.D.   On: 12/23/2019 13:12     CBC Recent Labs  Lab 12/25/19 1009 12/26/19 0723 12/30/19 0508  WBC 2.7* 3.4* 3.8*  HGB 14.2 14.5 13.4  HCT 46.7* 48.8* 42.1  PLT 124* 131* 179  MCV 98.1 100.2* 91.9  MCH  29.8 29.8 29.3  MCHC 30.4 29.7* 31.8  RDW 14.1 14.0 14.2  LYMPHSABS  1.1 1.2  --   MONOABS 0.2 0.4  --   EOSABS 0.0 0.0  --   BASOSABS 0.0 0.0  --     Chemistries  Recent Labs  Lab 12/25/19 1009 12/26/19 0723 12/27/19 0622 12/30/19 0508  NA 136 134* 135 135  K 4.7 5.4* 4.0 4.2  CL 104 105 102 104  CO2 24 22 25 24   GLUCOSE 156* 139* 166* 142*  BUN 28* 24* 21 22  CREATININE 0.84 0.74 0.60 0.65  CALCIUM 8.3* 8.0* 7.9* 8.3*  MG 2.0 1.9  --   --   AST 34 37  --   --   ALT 19 18  --   --   ALKPHOS 58 51  --   --   BILITOT 0.4 1.2  --   --    ------------------------------------------------------------------------------------------------------------------ No results for input(s): CHOL, HDL, LDLCALC, TRIG, CHOLHDL, LDLDIRECT in the last 72 hours.  No results found for: HGBA1C ------------------------------------------------------------------------------------------------------------------ No results for input(s): TSH, T4TOTAL, T3FREE, THYROIDAB in the last 72 hours.  Invalid input(s): FREET3 ------------------------------------------------------------------------------------------------------------------ No results for input(s): VITAMINB12, FOLATE, FERRITIN, TIBC, IRON, RETICCTPCT in the last 72 hours.  Coagulation profile Recent Labs  Lab 12/27/19 0622 12/28/19 0654 12/29/19 1231 12/30/19 0508 12/31/19 0656  INR 2.7* 2.9* 3.8* 3.7* 3.2*    No results for input(s): DDIMER in the last 72 hours.  Cardiac Enzymes No results for input(s): CKMB, TROPONINI, MYOGLOBIN in the last 168 hours.  Invalid input(s): CK ------------------------------------------------------------------------------------------------------------------ No results found for: BNP  Resting , appears comfortable -appetite has improved -No new medical concerns -medically stable, patient's daughter refusing to allow patient to go to SNF rehab . -Patient's daughter also refusing to take patient home  with home health services  - Patient remains medically stable -Patient is not requiring any hospital/inpatient level care at this time  02/28/20 M.D on 12/31/2019 at 10:58 AM  Go to [email or web address] - for contact info  [facility] - Office  [phone]

## 2019-12-31 NOTE — Progress Notes (Signed)
ANTICOAGULATION CONSULT NOTE -  Pharmacy Consult for warfarin Indication: atrial fibrillation  Allergies  Allergen Reactions  . Contrast Media [Iodinated Diagnostic Agents] Other (See Comments)    "shaking"  . Penicillins Other (See Comments)    Has patient had a PCN reaction causing immediate rash, facial/tongue/throat swelling, SOB or lightheadedness with hypotension: Unknown Has patient had a PCN reaction causing severe rash involving mucus membranes or skin necrosis: Unknown Has patient had a PCN reaction that required hospitalization: Unknown Has patient had a PCN reaction occurring within the last 10 years: Unknown If all of the above answers are "NO", then may proceed with Cephalosporin use.  . Sulfa Antibiotics     Patient Measurements: Height: 5' (152.4 cm) Weight: 153 lb (69.4 kg) IBW/kg (Calculated) : 45.5   Vital Signs: Temp: 98.7 F (37.1 C) (02/02 0400) Temp Source: Oral (02/02 0400) BP: 157/58 (02/02 0400) Pulse Rate: 81 (02/02 0400)  Labs: Recent Labs    12/29/19 1231 12/30/19 0508 12/31/19 0656  HGB  --  13.4  --   HCT  --  42.1  --   PLT  --  179  --   LABPROT 37.2* 36.6* 32.5*  INR 3.8* 3.7* 3.2*  CREATININE  --  0.65  --     Estimated Creatinine Clearance: 40.7 mL/min (by C-G formula based on SCr of 0.65 mg/dL).   Medical History: Past Medical History:  Diagnosis Date  . Acid reflux   . Atrial fibrillation (HCC)   . Atrophic vaginitis   . Chronic cystitis   . Decubitus ulcer of buttock   . Dementia (HCC)   . Hyperlipidemia   . Hypertension   . Incomplete bladder emptying   . Microscopic hematuria   . Pacemaker   . Straining on urination   . Symptomatic bradycardia   . Urge incontinence of urine   . UTI (lower urinary tract infection)     Medications:  Medications Prior to Admission  Medication Sig Dispense Refill Last Dose  . cephALEXin (KEFLEX) 250 MG capsule Take 250 mg by mouth at bedtime.   12/22/2019 at Unknown time  .  furosemide (LASIX) 40 MG tablet Take 20 mg by mouth daily.    12/23/2019 at Unknown time  . guaifenesin (ROBITUSSIN) 100 MG/5ML syrup Take 200-400 mg by mouth 4 (four) times daily as needed for cough.    12/23/2019 at Unknown time  . loratadine (CLARITIN) 10 MG tablet Take 10 mg by mouth daily as needed for allergies.   12/23/2019 at Unknown time  . metoprolol tartrate (LOPRESSOR) 25 MG tablet Take 1 tablet each morning and 1/2 tablet each evening.  (okay to crush) (Patient taking differently: Take 12.5-25 mg by mouth See admin instructions. Take 1 tablet each morning and 1/2 tablet each evening.  (okay to crush)) 135 tablet 3 12/23/2019 at 1030  . Multiple Vitamins-Minerals (CENTRUM SILVER PO) Take 1 tablet by mouth daily.     12/23/2019 at Unknown time  . omeprazole (PRILOSEC) 20 MG capsule Take 20 mg by mouth every morning.    12/23/2019 at Unknown time  . potassium chloride SA (K-DUR,KLOR-CON) 20 MEQ tablet Take 20 mEq by mouth every morning.    12/23/2019 at Unknown time  . simvastatin (ZOCOR) 10 MG tablet Take 10 mg by mouth daily.   12/22/2019 at Unknown time  . Travoprost, BAK Free, (TRAVATAN Z) 0.004 % SOLN ophthalmic solution Place 1 drop into both eyes at bedtime.   12/22/2019 at Unknown time  . warfarin (COUMADIN)  3 MG tablet TAKE 1 TABLET DAILY EXCEPT TAKE 1/2 TABLET ON MONDAYS AND THURSDAYS OR AS DIRECTED. (Patient taking differently: Take 1.5-3 mg by mouth See admin instructions. TAKE 1 TABLET DAILY EXCEPT TAKE 1/2 TABLET ON MONDAYS AND THURSDAYS OR AS DIRECTED.) 90 tablet 1 12/22/2019 at 1830  . nitrofurantoin, macrocrystal-monohydrate, (MACROBID) 100 MG capsule Take 1 capsule (100 mg total) by mouth 2 (two) times daily. 14 capsule 0     Assessment: Pharmacy consulted to dose warfarin in patient with atrial fibrillation.  INR 1.9>> 2.7>2.4>2.7>2.9> 3.8>3.7>3.2  Home regimen lsted as 1.5 mg on Mon and Thurs and 3 mg ROW.  Dosing will likely need to lower than indicated home dose due to  supratherapeutic INR's  Goal of Therapy:  INR 2-3 Monitor platelets by anticoagulation protocol: Yes   Plan:  Hold warfarin today Monitor labs and s/s of bleeding  Margot Ables, PharmD Clinical Pharmacist 12/31/2019 8:10 AM

## 2019-12-31 NOTE — Progress Notes (Signed)
[employer] Appeal Detailed Notice of Discharge letter created and saved: Yes Detailed Notice of Discharge Document Given to Pateint: Yes(daughter was notified telephonically, copy was mailed) to [facility]  [address], [place], [place] [address]   [facility] appeal documents uploaded to [employer] stite: Yes

## 2019-12-31 NOTE — Discharge Summary (Addendum)
[name], is a 86 y.o. female  DOB 11/08/1927  MRN [id number].  Admission date:  12/23/2019  Admitting Physician  Shon Hale, MD  Discharge Date:  01/02/2020   Primary MD  Avon Gully, MD  Recommendations for primary care physician for things to follow:   1) you are taking Coumadin for blood thinner so Avoid ibuprofen/Advil/Aleve/Motrin/Goody Powders/Naproxen/BC powders/Meloxicam/Diclofenac/Indomethacin and other Nonsteroidal anti-inflammatory medications as these will make you more likely to bleed and can cause stomach ulcers, can also cause Kidney problems.   2)Recheck PT/INR and CBC 3 days post discharge--PCP to adjust Coumadin after INR recheck  3) You are strongly advised to  to isolate/quarantine for at least 21 days from the date of your diagnosis  with COVID-19 infection--Isolate from 12/23/19 thru 01/12/2020, Please always wear a mask if you have to go outside the house  4) continue to encourage adequate oral intake especially fluid intake  5) please note that there has been several changes to your medications---  6)Decrease Coumadin to 1mg  daily---- restart Warfarin/Coumadin at 1mg  daily on 01/03/2020  Admission Diagnosis  Encephalopathy acute [G93.40] Acute cystitis without hematuria [N30.00] Acute respiratory disease due to COVID-19 virus [U07.1, J06.9] COVID-19 [U07.1]   Discharge Diagnosis  Encephalopathy acute [G93.40] Acute cystitis without hematuria [N30.00] Acute respiratory disease due to COVID-19 virus [U07.1, J06.9] COVID-19 [U07.1]    Principal Problem:   Acute respiratory disease due to COVID-19 virus Active Problems:   Atrial fibrillation [facility])   Pacemaker - Medtronic 2007, new generator 2014   Long term current use of anticoagulant therapy   Dementia without behavioral disturbance (HCC)   Acute lower UTI--- H/o E coli ESBL      Past Medical History:    Diagnosis Date  . Acid reflux   . Atrial fibrillation (HCC)   . Atrophic vaginitis   . Chronic cystitis   . Decubitus ulcer of buttock   . Dementia (HCC)   . Hyperlipidemia   . Hypertension   . Incomplete bladder emptying   . Microscopic hematuria   . Pacemaker   . Straining on urination   . Symptomatic bradycardia   . Urge incontinence of urine   . UTI (lower urinary tract infection)     Past Surgical History:  Procedure Laterality Date  . CARDIAC CATHETERIZATION  04/2004  . PACEMAKER GENERATOR CHANGE N/A 07/05/2013   Procedure: PACEMAKER GENERATOR CHANGE;  Surgeon: 05/2004, MD;  Location: [facility] CATH LAB;  Service: Cardiovascular;  Laterality: N/A;  . PACEMAKER INSERTION       HPI  from the history and physical done on the day of admission:    HPI on Admission as documented by Admitting Physician on day of admission:-   [name]  is a 86 y.o. female with past medical history relevant for atrial fibrillation with status post prior pacemaker placement and history of prior ESBL E. coli UTI on underlying dementia presents to the ED after a fall  -Patient also had exposure to COVID-19 COVID-19 testing is positive in the  ED patient is found to be hypoxic requiring 2 L of oxygen  History is very limited due to advanced dementia with significant cognitive deficits  -Chest x-ray with possible atelectasis versus pneumonia  -CRP is elevated at 2.2, D-dimer is 2.41 patient is on Coumadin -White count of 3.6 -UA suspicious for UTI -Blood cultures pending -Fevers above 102 noted     Hospital Course:     Brief Summary:- 86 y.o.femalewith past medical history relevant for atrial fibrillation with status post prior pacemaker placement and history of prior ESBL E. coli UTI admitted on 12/22/2018 with COVID-19 pneumonia and poor oral intake medically stable, patient's daughter refusing to allow patient to go to SNF rehab . -Patient's daughter also refusing to take  patient home with home health services    A/p 1)Acute hypoxic respiratory failure secondary to COVID-19 infection/pneumonia-- --Patient is positive for COVID-19 infection, chest x-ray with findings of infiltrates/opacities, - -completed 5 days of IV remdesivir on 12/27/2019  -Okay to complete steroid therapy per protocol -started on 12/23/19---for total of 10 days -Hypoxia has resolved --Attempt to maintain euvolemic state --Zinc and vitamin C as ordered -Albuterol inhaler as needed -  2)Presumed UTI--- H/o Prior ESBL E. coli UTI--treated empirically with IVMeropenem , cultures were obtained after patient has been on antibiotics for over 24 hours- Treated with meropenem, through 12/26/2019, received fosfomycin 3 g x 1 dose on 12/26/2019 --cultures without definite growth at this time -No further antibiotics  3)chronic atrial fibrillation/S/p PPM-- coumadin and metoprolol as ordered, INR is  above 3, pharmacy to adjust  4)social/ethics-- patient is a DNR  5) advanced dementia--- patient with significant cognitive difficulties, no significant agitation --Continue routine cares  6)FEN/Hyperkalemia-- -Oral intake has improved -Previously treated with Lokelma for hyperkalemia -Potassium has normalized at 4.2, -Appetite is improving on Marinol 2.5 mg bid    7) generalized weakness/deconditioning in the setting of advanced dementia with significant cognitive and memory deficits--- physical therapy evaluation appreciated,, spoke with patient's daughter and son-in-law -PTA patient lived alone with caregivers coming for several hours a day -medically stable, patient's daughter refusing to allow patient to go to SNF rehab . -Patient's daughter also refusing to take patient home with home health services   Disposition/Need for in-Hospital Stay- patient unable to be discharged at this time due to ----Resting , appears comfortable -appetite has improved -No new medical  concerns -medically stable, patient's daughter refusing to allow patient to go to SNF rehab . -Patient's daughter also refusing to take patient home with home health services  - Patient remains medically stable -Patient is not requiring any hospital/inpatient level care at this time Code Status : DNR  Family Communication:    -Called and discussed with patient's daughter and son-in-law again  Consults  :  na   Discharge Condition: -Stable, Resting , appears comfortable -appetite has improved -No new medical concerns -medically stable, patient's daughter refusing to allow patient to go to SNF rehab . -Patient's daughter also refusing to take patient home with home health services  - Patient remains medically stable -Patient is not requiring any hospital/inpatient level care at this time  Follow UP  Follow-up Information    Avon GullyFanta, Tesfaye, MD. Call in 3 day(s).   Specialty: Internal Medicine Why: You need repeat CBC and PT/INR within 3 days of discharge Contact information: [address] [place] [address] [phone]           Consults obtained - na  Diet and Activity recommendation:  As advised  Discharge Instructions    Discharge Instructions    Call MD for:  difficulty breathing, headache or visual disturbances   Complete by: As directed    Call MD for:  extreme fatigue   Complete by: As directed    Call MD for:  persistant dizziness or light-headedness   Complete by: As directed    Call MD for:  persistant nausea and vomiting   Complete by: As directed    Call MD for:  severe uncontrolled pain   Complete by: As directed    Call MD for:  temperature >100.4   Complete by: As directed    Diet - low sodium heart healthy   Complete by: As directed    Discharge instructions   Complete by: As directed    1) you are taking Coumadin for blood thinner so Avoid ibuprofen/Advil/Aleve/Motrin/Goody Powders/Naproxen/BC  powders/Meloxicam/Diclofenac/Indomethacin and other Nonsteroidal anti-inflammatory medications as these will make you more likely to bleed and can cause stomach ulcers, can also cause Kidney problems.   2)Recheck PT/INR and CBC 3 days post discharge--PCP to adjust Coumadin after INR recheck  3) You are strongly advised to  to isolate/quarantine for at least 21 days from the date of your diagnosis  with COVID-19 infection--Isolate from 12/23/19 thru 01/12/2020, Please always wear a mask if you have to go outside the house  4) continue to encourage adequate oral intake especially fluid intake  5) please note that there has been several changes to your medications---  6)Decrease Coumadin to 1mg  daily---- restart Warfarin/Coumadin at 1mg  daily on 01/03/2020   Increase activity slowly   Complete by: As directed       Discharge Medications     Allergies as of 01/02/2020      Reactions   Contrast Media [iodinated Diagnostic Agents] Other (See Comments)   "shaking"   Penicillins Other (See Comments)   Has patient had a PCN reaction causing immediate rash, facial/tongue/throat swelling, SOB or lightheadedness with hypotension: Unknown Has patient had a PCN reaction causing severe rash involving mucus membranes or skin necrosis: Unknown Has patient had a PCN reaction that required hospitalization: Unknown Has patient had a PCN reaction occurring within the last 10 years: Unknown If all of the above answers are "NO", then may proceed with Cephalosporin use.   Sulfa Antibiotics       Medication List    STOP taking these medications   cephALEXin 250 MG capsule Commonly known as: KEFLEX   furosemide 40 MG tablet Commonly known as: LASIX   nitrofurantoin (macrocrystal-monohydrate) 100 MG capsule Commonly known as: MACROBID   potassium chloride SA 20 MEQ tablet Commonly known as: KLOR-CON     TAKE these medications   acetaminophen 325 MG tablet Commonly known as: TYLENOL Take 2 tablets  (650 mg total) by mouth every 6 (six) hours as needed for mild pain (or Fever >/= 101).   albuterol 108 (90 Base) MCG/ACT inhaler Commonly known as: VENTOLIN HFA Inhale 2 puffs into the lungs every 4 (four) hours as needed for wheezing or shortness of breath.   ascorbic acid 500 MG tablet Commonly known as: VITAMIN C Take 1 tablet (500 mg total) by mouth daily.   CENTRUM SILVER PO Take 1 tablet by mouth daily.   dronabinol 2.5 MG capsule Commonly known as: MARINOL Take 1 capsule (2.5 mg total) by mouth 2 (two) times daily before lunch and supper.   folic acid 1 MG tablet Commonly known as: FOLVITE Take 1 tablet (1 mg total) by  mouth daily.   guaifenesin 100 MG/5ML syrup Commonly known as: ROBITUSSIN Take 200-400 mg by mouth 4 (four) times daily as needed for cough. What changed: Another medication with the same name was added. Make sure you understand how and when to take each.   guaiFENesin 600 MG 12 hr tablet Commonly known as: MUCINEX Take 1 tablet (600 mg total) by mouth 2 (two) times daily. What changed: You were already taking a medication with the same name, and this prescription was added. Make sure you understand how and when to take each.   loratadine 10 MG tablet Commonly known as: CLARITIN Take 10 mg by mouth daily as needed for allergies.   metoprolol tartrate 25 MG tablet Commonly known as: LOPRESSOR Take 0.5 tablets (12.5 mg total) by mouth 2 (two) times daily. What changed:   how much to take  how to take this  when to take this  additional instructions   omeprazole 20 MG capsule Commonly known as: PRILOSEC Take 20 mg by mouth every morning.   QUEtiapine 25 MG tablet Commonly known as: SEROQUEL Take 1 tablet (25 mg total) by mouth at bedtime.   simvastatin 10 MG tablet Commonly known as: ZOCOR Take 10 mg by mouth daily.   thiamine 100 MG tablet Take 1 tablet (100 mg total) by mouth daily.   Travatan Z 0.004 % Soln ophthalmic  solution Generic drug: Travoprost (BAK Free) Place 1 drop into both eyes at bedtime.   warfarin 1 MG tablet Commonly known as: Coumadin Take as directed. If you are unsure how to take this medication, talk to your nurse or doctor. Original instructions: Take 1 tablet (1 mg total) by mouth daily. Start taking on: January 03, 2020 What changed:   medication strength  See the new instructions.   zinc sulfate 220 (50 Zn) MG capsule Take 1 capsule (220 mg total) by mouth daily.      Major procedures and Radiology Reports - PLEASE review detailed and final reports for all details, in brief -    DG Chest Portable 1 View  Result Date: 12/23/2019 CLINICAL DATA:  Cough. EXAM: PORTABLE CHEST 1 VIEW COMPARISON:  11/29/2018 and 11/24/2018 FINDINGS: Chronic cardiomegaly with extensive aortic atherosclerosis. Pulmonary vascularity is within normal limits. Slight new atelectasis at the left base. Single tiny area of atelectasis at the right base. Pacemaker in place. Old compression fracture in the lower thoracic spine. IMPRESSION: New slight atelectasis at the left base. Minimal new atelectasis at the right base. Chronic cardiomegaly.  Aortic Atherosclerosis (ICD10-I70.0). Electronically Signed   By: Francene Boyers M.D.   On: 12/23/2019 13:12   Micro Results   Recent Results (from the past 240 hour(s))  Blood Culture (routine x 2)     Status: None   Collection Time: 12/23/19  1:05 PM   Specimen: Right Antecubital; Blood  Result Value Ref Range Status   Specimen Description RIGHT ANTECUBITAL  Final   Special Requests   Final    BOTTLES DRAWN AEROBIC ONLY Blood Culture results may not be optimal due to an inadequate volume of blood received in culture bottles   Culture   Final    NO GROWTH 5 DAYS Performed at [facility], [address] [place], [place] [address]    Report Status 12/28/2019 FINAL  Final  Blood Culture (routine x 2)     Status: None   Collection Time: 12/23/19  1:20 PM    Specimen: Left Antecubital; Blood  Result Value Ref Range Status  Specimen Description LEFT ANTECUBITAL  Final   Special Requests   Final    BOTTLES DRAWN AEROBIC AND ANAEROBIC Blood Culture adequate volume   Culture   Final    NO GROWTH 5 DAYS Performed at [facility], [address], [place], [place] [address]    Report Status 12/28/2019 FINAL  Final  Urine Culture     Status: Abnormal   Collection Time: 12/25/19 12:45 AM   Specimen: Urine, Clean Catch  Result Value Ref Range Status   Specimen Description   Final    URINE, CLEAN CATCH Performed at [facility], [address] [place], [place] [address]    Special Requests   Final    Normal Performed at [facility], [address] [place], [place] [address]    Culture (A)  Final    <10,000 COLONIES/mL INSIGNIFICANT GROWTH Performed at [facility] [address], [place], [place] [address]    Report Status 12/26/2019 FINAL  Final    Today   Subjective    [name] today has no new complaints -Eating and drinking -Resting , appears comfortable -appetite has improved -No new medical concerns -medically stable, patient's daughter refusing to allow patient to go to SNF rehab . -Patient's daughter also refusing to take patient home with home health services  - Patient remains medically stable -Patient is not requiring any hospital/inpatient level care at this time   Patient has been seen and examined prior to discharge   Objective   Blood pressure 140/77, pulse 98, temperature 98.5 F (36.9 C), temperature source Axillary, resp. rate 17, height 5' (1.524 m), weight 69.4 kg, SpO2 95 %.   Intake/Output Summary (Last 24 hours) at 01/02/2020 1147 Last data filed at 01/02/2020 0900 Gross per 24 hour  Intake 890 ml  Output 700 ml  Net 190 ml    Exam Gen:- Awake Alert, resting comfortably HEENT:- [place].AT, No sclera icterus Neck-Supple Neck,No JVD,.  Lungs improved air movement,, no wheezing  CV- S1, S2  normal, regular , left subclavian PPM in situ Abd-  +ve B.Sounds, Abd Soft, No tenderness,    Extremity/Skin:- No  edema, pedal pulses present  Psych- pleasantly confused, disoriented, at baseline patient has severe/significant cognitive and memory deficits due to underlying dementia -Cooperative Neuro-generalized weakness, no new focal deficits, no tremors   Data Review   CBC w Diff:  Lab Results  Component Value Date   WBC 3.8 (L) 12/30/2019   HGB 13.4 12/30/2019   HCT 42.1 12/30/2019   PLT 179 12/30/2019   LYMPHOPCT 36 12/26/2019   MONOPCT 13 12/26/2019   EOSPCT 0 12/26/2019   BASOPCT 0 12/26/2019    CMP:  Lab Results  Component Value Date   NA 135 12/30/2019   K 4.2 12/30/2019   CL 104 12/30/2019   CO2 24 12/30/2019   BUN 22 12/30/2019   CREATININE 0.65 12/30/2019   CREATININE 0.93 08/13/2013   PROT 5.6 (L) 12/26/2019   ALBUMIN 2.4 (L) 12/26/2019   BILITOT 1.2 12/26/2019   ALKPHOS 51 12/26/2019   AST 37 12/26/2019   ALT 18 12/26/2019  .   Total Discharge time is about 33 minutes  Roxan Hockey M.D on 01/02/2020 at 11:47 AM  Go to [email or web address] -  for contact info  [facility] - Office  [phone]
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
## 2020-01-01 LAB — PROTIME-INR
INR: 3.1 — ABNORMAL HIGH (ref 0.8–1.2)
Prothrombin Time: 32.1 seconds — ABNORMAL HIGH (ref 11.4–15.2)

## 2020-01-01 LAB — GLUCOSE, CAPILLARY
Glucose-Capillary: 77 mg/dL (ref 70–99)
Glucose-Capillary: 87 mg/dL (ref 70–99)
Glucose-Capillary: 143 mg/dL — ABNORMAL HIGH (ref 70–99)
Glucose-Capillary: 151 mg/dL — ABNORMAL HIGH (ref 70–99)

## 2020-01-01 NOTE — Progress Notes (Signed)
ANTICOAGULATION CONSULT NOTE -  Pharmacy Consult for warfarin Indication: atrial fibrillation  Allergies  Allergen Reactions  . Contrast Media [Iodinated Diagnostic Agents] Other (See Comments)    "shaking"  . Penicillins Other (See Comments)    Has patient had a PCN reaction causing immediate rash, facial/tongue/throat swelling, SOB or lightheadedness with hypotension: Unknown Has patient had a PCN reaction causing severe rash involving mucus membranes or skin necrosis: Unknown Has patient had a PCN reaction that required hospitalization: Unknown Has patient had a PCN reaction occurring within the last 10 years: Unknown If all of the above answers are "NO", then may proceed with Cephalosporin use.  . Sulfa Antibiotics     Patient Measurements: Height: 5' (152.4 cm) Weight: 153 lb (69.4 kg) IBW/kg (Calculated) : 45.5   Vital Signs: Temp: 97.8 F (36.6 C) (02/03 0523) Temp Source: Oral (02/03 0523) BP: 137/55 (02/03 0523) Pulse Rate: 76 (02/03 0523)  Labs: Recent Labs    12/30/19 0508 12/31/19 0656 01/01/20 0355  HGB 13.4  --   --   HCT 42.1  --   --   PLT 179  --   --   LABPROT 36.6* 32.5* 32.1*  INR 3.7* 3.2* 3.1*  CREATININE 0.65  --   --     Estimated Creatinine Clearance: 40.7 mL/min (by C-G formula based on SCr of 0.65 mg/dL).   Medical History: Past Medical History:  Diagnosis Date  . Acid reflux   . Atrial fibrillation ([place])   . Atrophic vaginitis   . Chronic cystitis   . Decubitus ulcer of buttock   . Dementia ([place])   . Hyperlipidemia   . Hypertension   . Incomplete bladder emptying   . Microscopic hematuria   . Pacemaker   . Straining on urination   . Symptomatic bradycardia   . Urge incontinence of urine   . UTI (lower urinary tract infection)     Medications:  Medications Prior to Admission  Medication Sig Dispense Refill Last Dose  . cephALEXin (KEFLEX) 250 MG capsule Take 250 mg by mouth at bedtime.   12/22/2019 at Unknown time  .  furosemide (LASIX) 40 MG tablet Take 20 mg by mouth daily.    12/23/2019 at Unknown time  . guaifenesin (ROBITUSSIN) 100 MG/5ML syrup Take 200-400 mg by mouth 4 (four) times daily as needed for cough.    12/23/2019 at Unknown time  . loratadine (CLARITIN) 10 MG tablet Take 10 mg by mouth daily as needed for allergies.   12/23/2019 at Unknown time  . metoprolol tartrate (LOPRESSOR) 25 MG tablet Take 1 tablet each morning and 1/2 tablet each evening.  (okay to crush) (Patient taking differently: Take 12.5-25 mg by mouth See admin instructions. Take 1 tablet each morning and 1/2 tablet each evening.  (okay to crush)) 135 tablet 3 12/23/2019 at 1030  . Multiple Vitamins-Minerals (CENTRUM SILVER PO) Take 1 tablet by mouth daily.     12/23/2019 at Unknown time  . omeprazole (PRILOSEC) 20 MG capsule Take 20 mg by mouth every morning.    12/23/2019 at Unknown time  . potassium chloride SA (K-DUR,KLOR-CON) 20 MEQ tablet Take 20 mEq by mouth every morning.    12/23/2019 at Unknown time  . simvastatin (ZOCOR) 10 MG tablet Take 10 mg by mouth daily.   12/22/2019 at Unknown time  . Travoprost, BAK Free, (TRAVATAN Z) 0.004 % SOLN ophthalmic solution Place 1 drop into both eyes at bedtime.   12/22/2019 at Unknown time  . warfarin (COUMADIN)  3 MG tablet TAKE 1 TABLET DAILY EXCEPT TAKE 1/2 TABLET ON MONDAYS AND THURSDAYS OR AS DIRECTED. (Patient taking differently: Take 1.5-3 mg by mouth See admin instructions. TAKE 1 TABLET DAILY EXCEPT TAKE 1/2 TABLET ON MONDAYS AND THURSDAYS OR AS DIRECTED.) 90 tablet 1 12/22/2019 at 1830  . nitrofurantoin, macrocrystal-monohydrate, (MACROBID) 100 MG capsule Take 1 capsule (100 mg total) by mouth 2 (two) times daily. 14 capsule 0     Assessment: Pharmacy consulted to dose warfarin in patient with atrial fibrillation.  INR 1.9>> 2.7>2.4>2.7>2.9> 3.8>3.7>3.2>3.1   Home regimen lsted as 1.5 mg on Mon and Thurs and 3 mg ROW.  Dosing will likely need to lower than indicated home dose due to  supratherapeutic INR's  Goal of Therapy:  INR 2-3 Monitor platelets by anticoagulation protocol: Yes   Plan:  Hold warfarin today Monitor labs and s/s of bleeding  Judeth Cornfield, PharmD Clinical Pharmacist 01/01/2020 8:06 AM

## 2020-01-01 NOTE — Progress Notes (Signed)
Physical Therapy Treatment Patient Details Name: [name] MRN: [id number] DOB: [birth year] Today's Date: 01/01/2020    History of Present Illness [name]  is a 86 y.o. female with past medical history relevant for atrial fibrillation with status post prior pacemaker placement and history of prior ESBL E. coli UTI on underlying dementia presents to the ED after a fall    PT Comments    Patient initially more agreeable for functional activity and demonstrated improvement for sitting up at bedside with less assistance, but after a few minutes became more apprehensive and required Max verbal/tactile cueing to participate.  Patient able to complete ankle pumps while seated at bedside, required much encouragement to attempt sit to stand and completed 1 partial sit to stand before requesting to lie down and refused to transfer to chair due to c/o fatigue.  Patient will benefit from continued physical therapy in hospital and recommended venue below to increase strength, balance, endurance for safe ADLs and gait.    Follow Up Recommendations  SNF;Supervision for mobility/OOB;Supervision/Assistance - 24 hour     Equipment Recommendations  None recommended by PT    Recommendations for Other Services       Precautions / Restrictions Precautions Precautions: Fall Restrictions Weight Bearing Restrictions: No    Mobility  Bed Mobility Overal bed mobility: Needs Assistance Bed Mobility: Supine to Sit;Sit to Supine     Supine to sit: Mod assist Sit to supine: Mod assist;Max assist   General bed mobility comments: slow labored movement  Transfers Overall transfer level: Needs assistance Equipment used: Rolling walker (2 wheeled);1 person hand held assist Transfers: Sit to/from Stand Sit to Stand: Max assist         General transfer comment: limited to partial standing using RW with Max assist  Ambulation/Gait                 Stairs             Wheelchair  Mobility    Modified Rankin (Stroke Patients Only)       Balance Overall balance assessment: Needs assistance Sitting-balance support: Feet supported;No upper extremity supported Sitting balance-Leahy Scale: Fair Sitting balance - Comments: fair/good seated at EOB   Standing balance support: During functional activity;Bilateral upper extremity supported Standing balance-Leahy Scale: Poor Standing balance comment: using RW                            Cognition   Behavior During Therapy: Anxious Overall Cognitive Status: History of cognitive impairments - at baseline                                        Exercises General Exercises - Lower Extremity Ankle Circles/Pumps: Seated;AROM;Strengthening;Both;10 reps    General Comments        Pertinent Vitals/Pain Pain Assessment: Faces Faces Pain Scale: Hurts little more Pain Location: dorsal suface of left hand Pain Descriptors / Indicators: Sore Pain Intervention(s): Limited activity within patient's tolerance;Monitored during session    Home Living                      Prior Function            PT Goals (current goals can now be found in the care plan section) Acute Rehab PT Goals Patient Stated Goal: return home PT Goal Formulation:  With patient Time For Goal Achievement: 01/10/20 Potential to Achieve Goals: Good Progress towards PT goals: Progressing toward goals    Frequency    Min 3X/week      PT Plan Current plan remains appropriate    Co-evaluation              AM-PAC PT "6 Clicks" Mobility   Outcome Measure  Help needed turning from your back to your side while in a flat bed without using bedrails?: A Little Help needed moving from lying on your back to sitting on the side of a flat bed without using bedrails?: A Little Help needed moving to and from a bed to a chair (including a wheelchair)?: Total Help needed standing up from a chair using your arms  (e.g., wheelchair or bedside chair)?: A Lot Help needed to walk in hospital room?: Total Help needed climbing 3-5 steps with a railing? : Total 6 Click Score: 11    End of Session   Activity Tolerance: Patient tolerated treatment well;Patient limited by fatigue;Treatment limited secondary to agitation Patient left: in bed;with call bell/phone within reach;with bed alarm set Nurse Communication: Mobility status PT Visit Diagnosis: Unsteadiness on feet (R26.81);Other abnormalities of gait and mobility (R26.89);Muscle weakness (generalized) (M62.81)     Time: 7829-5621 PT Time Calculation (min) (ACUTE ONLY): 25 min  Charges:  $Therapeutic Activity: 23-37 mins                     4:14 PM, 01/01/20 Ocie Bob, MPT Physical Therapist with [facility] 336 [phone] office [phone] mobile phone

## 2020-01-01 NOTE — TOC Progression Note (Signed)
Transition of Care [facility]) - Progression Note    Patient Details  Name: [name] MRN: [id number] Date of Birth: 02/17/1927  Transition of Care [facility]) CM/SW Contact  Annice Needy, LCSW Phone Number: 01/01/2020, 4:58 PM  Clinical Narrative:    Spoke with daughter, [name]. [name] has spoken to [facility] and has been advised that patient will begin incurring charges 01/02/20 @ noon. [name] is agreeable to HHPT. Discussed services with [facility] as they accept her insurance. Referral made to [facility] with [facility] or HHPT.  Patient will discharge via [facility] on 01/02/20 if she remains medically stable. [name] advises that she will be at patient's home. Patient's caregivers return on 01/06/20.    Expected Discharge Plan: Home w Home Health Services Barriers to Discharge: Family Issues, Other (comment)(Daughter appealing discharge)  Expected Discharge Plan and Services Expected Discharge Plan: Home w Home Health Services         Expected Discharge Date: 12/31/19                                     Social Determinants of Health (SDOH) Interventions    Readmission Risk Interventions No flowsheet data found.

## 2020-01-01 NOTE — Progress Notes (Signed)
Patient Demographics:    [name], is a 86 y.o. female, DOB - [birth year], PTW:[id number]  Admit date - 12/23/2019   Admitting Physician Baraka Klatt Mariea Clonts, MD  Outpatient Primary MD for the patient is Avon Gully, MD  LOS - 9   Chief Complaint  Patient presents with  . Weakness  . Cough        Subjective:    [name] today has no fevers, no emesis,  No chest pain,  - Resting , appears comfortable -appetite has improved -No new medical concerns  -Patient is medically stable, patient's daughter refusing to allow patient to go to SNF rehab . -Patient's daughter also refusing to take patient home with home health services  - Patient remains medically stable for discharge -Patient is not requiring any hospital/inpatient level care at this time   Assessment  & Plan :    Principal Problem:   Acute respiratory disease due to COVID-19 virus Active Problems:   Atrial fibrillation [facility])   Pacemaker - Med[employer]onic 2007, new generator 2014   Long term current use of anticoagulant therapy   Dementia without behavioral disturbance (HCC)   Acute lower UTI--- H/o E coli ESBL  Brief Summary:- 86 y.o. female with past medical history relevant for atrial fibrillation with status post prior pacemaker placement and history of prior ESBL E. coli UTI admitted on 12/22/2018 with COVID-19 pneumonia and poor oral intake Pt is medically stable, patient's daughter refusing to allow patient to go to SNF rehab . -Patient's daughter also refusing to take patient home with home health services    A/p 1)Acute hypoxic respiratory failure secondary to COVID-19 infection/pneumonia-- --Patient is positive for COVID-19 infection, chest x-ray with findings of infiltrates/opacities, - -completed 5 days of IV remdesivir on 12/27/2019  -Okay to complete steroid therapy per protocol -started on 12/23/19---for total of 10  days -Hypoxia has resolved --Attempt to maintain euvolemic state --Zinc and vitamin C as ordered -Albuterol inhaler as needed -Not requiring oxygen   2)Presumed UTI--- H/o Prior ESBL E. coli UTI--treated empirically with IV Meropenem , cultures were obtained after patient has been on antibiotics for over 24 hours- Treated with meropenem, through 12/26/2019, received fosfomycin 3 g x 1 dose on 12/26/2019 --cultures without definite growth at this time -No further antibiotics  3) chronic atrial fibrillation/S/p PPM-- coumadin and metoprolol as ordered, INR is  above 3, pharmacy to adjust warfarin  4)Social/Ethics-- patient is a DNR, -PTA patient lived alone with paid caregivers and family members coming for several hours a day  5)Advanced dementia--- patient with significant cognitive difficulties, no significant agitation --Continue routine cares  6)FEN/Hyperkalemia-- -Oral intake has improved -Previously treated with Lokelma for hyperkalemia -Potassium has normalized -Appetite is improving on Marinol 2.5 mg bid    7) generalized weakness/deconditioning in the setting of advanced dementia with significant cognitive and memory deficits--- physical therapy evaluation appreciated,, spoke with patient's daughter and son-in-law -PTA patient lived alone with caregivers coming for several hours a day -medically stable, patient's daughter refusing to allow patient to go to SNF rehab . -Patient's daughter also refusing to take patient home with home health services   Disposition/Need for in-Hospital Stay- patient unable to be discharged at this time due to ----Resting , appears comfortable -appetite  has improved -No new medical concerns -medically stable, patient's daughter refusing to allow patient to go to SNF rehab . -Patient's daughter also refusing to take patient home with home health services  - Patient remains medically stable -Patient is not requiring any hospital/inpatient level  care at this time Code Status : DNR  Family Communication:    -Called and discussed with patient's daughter and son-in-law numerous times during the hospital stay Consults  :  na  DVT Prophylaxis  : Coumadin- SCDs *  Lab Results  Component Value Date   PLT 179 12/30/2019    Inpatient Medications  Scheduled Meds: . vitamin C  500 mg Oral Daily  . dexamethasone  6 mg Oral Daily  . dronabinol  2.5 mg Oral BID AC  . folic acid  1 mg Oral Daily  . guaiFENesin  600 mg Oral BID  . latanoprost  1 drop Both Eyes QHS  . metoprolol tartrate  12.5 mg Oral QHS  . metoprolol tartrate  25 mg Oral Daily  . multivitamin with minerals  1 tablet Oral Daily  . pantoprazole  40 mg Oral Daily  . QUEtiapine  25 mg Oral QHS  . simvastatin  10 mg Oral Daily  . sodium chloride flush  3 mL Intravenous Q12H  . thiamine  100 mg Oral Daily  . Warfarin - Pharmacist Dosing Inpatient   Does not apply q1800  . zinc sulfate  220 mg Oral Daily   Continuous Infusions: . sodium chloride Stopped (12/25/19 0555)  . dextrose 5 % and 0.45% NaCl 10 mL/hr (12/29/19 2130)   PRN Meds:.sodium chloride, acetaminophen **OR** acetaminophen, albuterol, chlorpheniramine-HYDROcodone, guaiFENesin-dextromethorphan, ondansetron **OR** ondansetron (ZOFRAN) IV, polyethylene glycol, sodium chloride flush, traZODone    Anti-infectives (From admission, onward)   Start     Dose/Rate Route Frequency Ordered Stop   12/26/19 1800  fosfomycin (MONUROL) packet 3 g     3 g Oral  Once 12/26/19 1016 12/26/19 1613   12/24/19 1500  cefTRIAXone (ROCEPHIN) 1 g in sodium chloride 0.9 % 100 mL IVPB  Status:  Discontinued     1 g 200 mL/hr over 30 Minutes Intravenous Every 24 hours 12/23/19 1507 12/23/19 1727   12/24/19 1000  remdesivir 100 mg in sodium chloride 0.9 % 100 mL IVPB     100 mg 200 mL/hr over 30 Minutes Intravenous Daily 12/23/19 1457 12/27/19 1113   12/23/19 2200  meropenem (MERREM) 1 g in sodium chloride 0.9 % 100 mL IVPB   Status:  Discontinued     1 g 200 mL/hr over 30 Minutes Intravenous Every 12 hours 12/23/19 1727 12/26/19 1016   12/23/19 1530  remdesivir 200 mg in sodium chloride 0.9% 250 mL IVPB     200 mg 580 mL/hr over 30 Minutes Intravenous Once 12/23/19 1457 12/23/19 1830   12/23/19 1500  cefTRIAXone (ROCEPHIN) 1 g in sodium chloride 0.9 % 100 mL IVPB     1 g 200 mL/hr over 30 Minutes Intravenous  Once 12/23/19 1434 12/23/19 1650        Objective:   Vitals:   12/31/19 1121 12/31/19 2130 01/01/20 0523 01/01/20 1117  BP: (!) 144/77 (!) 133/49 (!) 137/55 (!) 122/56  Pulse: 90 63 76 (!) 57  Resp:  20 20   Temp:  98.4 F (36.9 C) 97.8 F (36.6 C)   TempSrc:  Oral Oral   SpO2:  96% 96%   Weight:      Height:  Wt Readings from Last 3 Encounters:  12/23/19 69.4 kg  01/01/19 69.7 kg  12/29/18 71.7 kg     Intake/Output Summary (Last 24 hours) at 01/01/2020 1155 Last data filed at 01/01/2020 0900 Gross per 24 hour  Intake 657.43 ml  Output 1150 ml  Net -492.57 ml     Physical Exam  Gen:- Awake Alert, resting comfortably HEENT:- [place].AT, No sclera icterus Neck-Supple Neck,No JVD,.  Lungs improved air movement,, no wheezing  CV- S1, S2 normal, regular , left subclavian PPM in situ Abd-  +ve B.Sounds, Abd Soft, No tenderness,    Extremity/Skin:- No  edema, pedal pulses present  Psych- pleasantly confused, disoriented, at baseline patient has severe/significant cognitive and memory deficits -Cooperative Neuro-generalized weakness, no new focal deficits, no tremors   Data Review:   Micro Results Recent Results (from the past 240 hour(s))  Blood Culture (routine x 2)     Status: None   Collection Time: 12/23/19  1:05 PM   Specimen: Right Antecubital; Blood  Result Value Ref Range Status   Specimen Description RIGHT ANTECUBITAL  Final   Special Requests   Final    BOTTLES DRAWN AEROBIC ONLY Blood Culture results may not be optimal due to an inadequate volume of blood received  in culture bottles   Culture   Final    NO GROWTH 5 DAYS Performed at [facility], [address] [place], [place] [address]    Report Status 12/28/2019 FINAL  Final  Blood Culture (routine x 2)     Status: None   Collection Time: 12/23/19  1:20 PM   Specimen: Left Antecubital; Blood  Result Value Ref Range Status   Specimen Description LEFT ANTECUBITAL  Final   Special Requests   Final    BOTTLES DRAWN AEROBIC AND ANAEROBIC Blood Culture adequate volume   Culture   Final    NO GROWTH 5 DAYS Performed at [facility], [address] [place], [place] [address]    Report Status 12/28/2019 FINAL  Final  Urine Culture     Status: Abnormal   Collection Time: 12/25/19 12:45 AM   Specimen: Urine, Clean Catch  Result Value Ref Range Status   Specimen Description   Final    URINE, CLEAN CATCH Performed at [facility], [address] [place], [place] [address]    Special Requests   Final    Normal Performed at [facility], [address] [place], [place] [address]    Culture (A)  Final    <10,000 COLONIES/mL INSIGNIFICANT GROWTH Performed at [facility], [place] [address] [place], [place] [address]    Report Status 12/26/2019 FINAL  Final    Radiology Reports DG Chest Portable 1 View  Result Date: 12/23/2019 CLINICAL DATA:  Cough. EXAM: PORTABLE CHEST 1 VIEW COMPARISON:  11/29/2018 and 11/24/2018 FINDINGS: Chronic cardiomegaly with extensive aortic atherosclerosis. Pulmonary vascularity is within normal limits. Slight new atelectasis at the left base. Single tiny area of atelectasis at the right base. Pacemaker in place. Old compression fracture in the lower thoracic spine. IMPRESSION: New slight atelectasis at the left base. Minimal new atelectasis at the right base. Chronic cardiomegaly.  Aortic Atherosclerosis (ICD10-I70.0). Electronically Signed   By: Lorriane Shire M.D.   On: 12/23/2019 13:12     CBC Recent Labs  Lab 12/26/19 0723 12/30/19 0508  WBC 3.4* 3.8*  HGB  14.5 13.4  HCT 48.8* 42.1  PLT 131* 179  MCV 100.2* 91.9  MCH 29.8 29.3  MCHC 29.7*  31.8  RDW 14.0 14.2  LYMPHSABS 1.2  --   MONOABS 0.4  --   EOSABS 0.0  --   BASOSABS 0.0  --     Chemistries  Recent Labs  Lab 12/26/19 0723 12/27/19 0622 12/30/19 0508  NA 134* 135 135  K 5.4* 4.0 4.2  CL 105 102 104  CO2 22 25 24   GLUCOSE 139* 166* 142*  BUN 24* 21 22  CREATININE 0.74 0.60 0.65  CALCIUM 8.0* 7.9* 8.3*  MG 1.9  --   --   AST 37  --   --   ALT 18  --   --   ALKPHOS 51  --   --   BILITOT 1.2  --   --    ------------------------------------------------------------------------------------------------------------------ No results for input(s): CHOL, HDL, LDLCALC, TRIG, CHOLHDL, LDLDIRECT in the last 72 hours.  No results found for: HGBA1C ------------------------------------------------------------------------------------------------------------------ No results for input(s): TSH, T4TOTAL, T3FREE, THYROIDAB in the last 72 hours.  Invalid input(s): FREET3 ------------------------------------------------------------------------------------------------------------------ No results for input(s): VITAMINB12, FOLATE, FERRITIN, TIBC, IRON, RETICCTPCT in the last 72 hours.  Coagulation profile Recent Labs  Lab 12/28/19 0654 12/29/19 1231 12/30/19 0508 12/31/19 0656 01/01/20 0355  INR 2.9* 3.8* 3.7* 3.2* 3.1*    No results for input(s): DDIMER in the last 72 hours.  Cardiac Enzymes No results for input(s): CKMB, TROPONINI, MYOGLOBIN in the last 168 hours.  Invalid input(s): CK ------------------------------------------------------------------------------------------------------------------ No results found for: BNP  Resting , appears comfortable -appetite has improved -No new medical concerns -medically stable, patient's daughter refusing to allow patient to go to SNF rehab . -Patient's daughter also refusing to take patient home with home health services   - Patient remains medically stable -Patient is not requiring any hospital/inpatient level care at this time  02/29/20 M.D on 01/01/2020 at 11:55 AM  Go to [email or web address] - for contact info  [facility] - Office  [phone]

## 2020-01-01 NOTE — Plan of Care (Signed)
  Problem: Education: Goal: Knowledge of General Education information will improve Description: Including pain rating scale, medication(s)/side effects and non-pharmacologic comfort measures Outcome: Not Met (add Reason)   Problem: Health Behavior/Discharge Planning: Goal: Ability to manage health-related needs will improve Outcome: Not Met (add Reason)   Problem: Clinical Measurements: Goal: Ability to maintain clinical measurements within normal limits will improve Outcome: Not Met (add Reason) Goal: Will remain free from infection Outcome: Not Met (add Reason) Goal: Diagnostic test results will improve Outcome: Not Met (add Reason) Goal: Respiratory complications will improve Outcome: Not Met (add Reason) Goal: Cardiovascular complication will be avoided Outcome: Not Met (add Reason)   Problem: Activity: Goal: Risk for activity intolerance will decrease Outcome: Not Met (add Reason)   Problem: Nutrition: Goal: Adequate nutrition will be maintained Outcome: Not Met (add Reason)   Problem: Coping: Goal: Level of anxiety will decrease Outcome: Not Met (add Reason)   Problem: Elimination: Goal: Will not experience complications related to bowel motility Outcome: Not Met (add Reason) Goal: Will not experience complications related to urinary retention Outcome: Not Met (add Reason)   Problem: Pain Managment: Goal: General experience of comfort will improve Outcome: Not Met (add Reason)   Problem: Safety: Goal: Ability to remain free from injury will improve Outcome: Not Met (add Reason)   Problem: Skin Integrity: Goal: Risk for impaired skin integrity will decrease Outcome: Not Met (add Reason)   Patient medically cleared for d/c-- d/c appealed by daughter.

## 2020-01-02 LAB — GLUCOSE, CAPILLARY: Glucose-Capillary: 104 mg/dL — ABNORMAL HIGH (ref 70–99)

## 2020-01-02 MED ORDER — WARFARIN SODIUM 1 MG PO TABS: 1.0000 mg | ORAL_TABLET | Freq: Every day | ORAL | 1 refills | Status: AC

## 2020-01-02 NOTE — Discharge Instructions (Signed)
1) you are taking Coumadin for blood thinner so Avoid ibuprofen/Advil/Aleve/Motrin/Goody Powders/Naproxen/BC powders/Meloxicam/Diclofenac/Indomethacin and other Nonsteroidal anti-inflammatory medications as these will make you more likely to bleed and can cause stomach ulcers, can also cause Kidney problems.   2)Recheck PT/INR and CBC 3 days post discharge--PCP to adjust Coumadin after INR recheck  3) You are strongly advised to  to isolate/quarantine for at least 21 days from the date of your diagnosis  with COVID-19 infection--Isolate from 12/23/19 thru 01/12/2020, Please always wear a mask if you have to go outside the house  4) continue to encourage adequate oral intake especially fluid intake  5) please note that there has been several changes to your medications---  6)Decrease Coumadin to 1mg  daily---- restart Warfarin/Coumadin at 1mg  daily on 01/03/2020

## 2020-01-02 NOTE — Plan of Care (Signed)

## 2020-01-02 NOTE — Progress Notes (Signed)
Patient Demographics:    [name], is a 86 y.o. female, DOB - 02-01-1927, VQQ:[id number]  Admit date - 12/23/2019   Admitting Physician Davarion Cuffee Denton Brick, MD  Outpatient Primary MD for the patient is [name], MD  LOS - 10   Chief Complaint  Patient presents with  . Weakness  . Cough        Subjective:    [name] today has no fevers, no emesis,  No chest pain,  - Resting , appears comfortable -appetite has improved -No new medical concerns  -Patient is medically stable, patient's daughter refusing to allow patient to go to SNF rehab . -- Patient remains medically stable for discharge -Patient is not requiring any hospital/inpatient level care at this time  -Apparently family discharge appeal was denied -Daughter [name] [phone]--- is agreeable to take patient home with home health services  --I called and discussed care plan with patient's daughter at length, questions answered -Discharge medications including Coumadin dosage which have been adjusted to 1 mg daily and the need for repeat INR discussed at length with patient's daughter -Patient's daughter verbalized understanding of discharge plan     Assessment  & Plan :    Principal Problem:   Acute respiratory disease due to COVID-19 virus Active Problems:   Atrial fibrillation [facility])   Pacemaker - [employer] 2007, new generator 2014   Long term current use of anticoagulant therapy   Dementia without behavioral disturbance ([place])   Acute lower UTI--- H/o E coli ESBL  Brief Summary:- 86 y.o. female with past medical history relevant for atrial fibrillation with status post prior pacemaker placement and history of prior ESBL E. coli UTI admitted on 12/22/2018 with COVID-19 pneumonia and poor oral intake Pt is medically stable, patient's daughter refusing to allow patient to go to SNF rehab . -Patient's daughter  also refusing to take patient home with home health services    A/p 1)Acute hypoxic respiratory failure secondary to COVID-19 infection/pneumonia-- --Patient is positive for COVID-19 infection, chest x-ray with findings of infiltrates/opacities, - -completed 5 days of IV remdesivir on 12/27/2019  -Okay to complete steroid therapy per protocol -started on 12/23/19---for total of 10 days -Hypoxia has resolved --Attempt to maintain euvolemic state --Zinc and vitamin C as ordered -Albuterol inhaler as needed -Not requiring oxygen   2)Presumed UTI--- H/o Prior ESBL E. coli UTI--treated empirically with IV Meropenem , cultures were obtained after patient has been on antibiotics for over 24 hours- Treated with meropenem, through 12/26/2019, received fosfomycin 3 g x 1 dose on 12/26/2019 --cultures without definite growth at this time -No further antibiotics  3) chronic atrial fibrillation/S/p PPM-- coumadin and metoprolol as ordered, INR is  above 3, pharmacy to adjust warfarin  4)Social/Ethics-- patient is a DNR, -PTA patient lived alone with paid caregivers and family members coming for several hours a day  5)Advanced dementia--- patient with significant cognitive difficulties, no significant agitation --Continue routine cares  6)FEN/Hyperkalemia-- -Oral intake has improved -Previously treated with Lokelma for hyperkalemia -Potassium has normalized -Appetite is improving on Marinol 2.5 mg bid    7)Generalized Weakness/Deconditioning in the setting of advanced dementia with significant cognitive and memory deficits--- physical therapy evaluation appreciated,, spoke with patient's daughter and son-in-law -PTA patient lived  alone with caregivers coming for several hours a day -medically stable, patient's daughter refusing to allow patient to go to SNF rehab .    Disposition--medically stable, patient's daughter refusing to allow patient to go to SNF rehab . -Discharge home with home health  services - Patient remains medically stable -Patient is not requiring any hospital/inpatient level care at this time Code Status : DNR  Family Communication:    -Called and discussed with patient's daughter and son-in-law numerous times during the hospital stay including on the day of discharge Consults  :  na  DVT Prophylaxis  : Coumadin- SCDs *  Lab Results  Component Value Date   PLT 179 12/30/2019    Inpatient Medications  Scheduled Meds: . vitamin C  500 mg Oral Daily  . dronabinol  2.5 mg Oral BID AC  . folic acid  1 mg Oral Daily  . guaiFENesin  600 mg Oral BID  . latanoprost  1 drop Both Eyes QHS  . metoprolol tartrate  12.5 mg Oral QHS  . metoprolol tartrate  25 mg Oral Daily  . multivitamin with minerals  1 tablet Oral Daily  . pantoprazole  40 mg Oral Daily  . QUEtiapine  25 mg Oral QHS  . simvastatin  10 mg Oral Daily  . sodium chloride flush  3 mL Intravenous Q12H  . thiamine  100 mg Oral Daily  . Warfarin - Pharmacist Dosing Inpatient   Does not apply q1800  . zinc sulfate  220 mg Oral Daily   Continuous Infusions: . sodium chloride Stopped (12/25/19 0555)  . dextrose 5 % and 0.45% NaCl 10 mL/hr (12/29/19 2130)   PRN Meds:.sodium chloride, acetaminophen **OR** acetaminophen, albuterol, chlorpheniramine-HYDROcodone, guaiFENesin-dextromethorphan, ondansetron **OR** ondansetron (ZOFRAN) IV, polyethylene glycol, sodium chloride flush, traZODone    Anti-infectives (From admission, onward)   Start     Dose/Rate Route Frequency Ordered Stop   12/26/19 1800  fosfomycin (MONUROL) packet 3 g     3 g Oral  Once 12/26/19 1016 12/26/19 1613   12/24/19 1500  cefTRIAXone (ROCEPHIN) 1 g in sodium chloride 0.9 % 100 mL IVPB  Status:  Discontinued     1 g 200 mL/hr over 30 Minutes Intravenous Every 24 hours 12/23/19 1507 12/23/19 1727   12/24/19 1000  remdesivir 100 mg in sodium chloride 0.9 % 100 mL IVPB     100 mg 200 mL/hr over 30 Minutes Intravenous Daily 12/23/19  1457 12/27/19 1113   12/23/19 2200  meropenem (MERREM) 1 g in sodium chloride 0.9 % 100 mL IVPB  Status:  Discontinued     1 g 200 mL/hr over 30 Minutes Intravenous Every 12 hours 12/23/19 1727 12/26/19 1016   12/23/19 1530  remdesivir 200 mg in sodium chloride 0.9% 250 mL IVPB     200 mg 580 mL/hr over 30 Minutes Intravenous Once 12/23/19 1457 12/23/19 1830   12/23/19 1500  cefTRIAXone (ROCEPHIN) 1 g in sodium chloride 0.9 % 100 mL IVPB     1 g 200 mL/hr over 30 Minutes Intravenous  Once 12/23/19 1434 12/23/19 1650        Objective:   Vitals:   01/01/20 1315 01/01/20 2255 01/02/20 0605 01/02/20 0900  BP: (!) 138/52 (!) 146/57 140/77   Pulse: 60 73 98   Resp: 18 20 17    Temp: 98.9 F (37.2 C) 97.8 F (36.6 C) 98.5 F (36.9 C)   TempSrc: Oral Oral Axillary   SpO2: 95% 95% 96% 95%  Weight:  Height:        Wt Readings from Last 3 Encounters:  12/23/19 69.4 kg  01/01/19 69.7 kg  12/29/18 71.7 kg     Intake/Output Summary (Last 24 hours) at 01/02/2020 1335 Last data filed at 01/02/2020 0900 Gross per 24 hour  Intake 690 ml  Output 500 ml  Net 190 ml     Physical Exam  Gen:- Awake Alert, resting comfortably HEENT:- [place].AT, No sclera icterus Neck-Supple Neck,No JVD,.  Lungs improved air movement,, no wheezing  CV- S1, S2 normal, regular , left subclavian PPM in situ Abd-  +ve B.Sounds, Abd Soft, No tenderness,    Extremity/Skin:- No  edema, pedal pulses present  Psych- pleasantly confused, disoriented, at baseline patient has severe/significant cognitive and memory deficits -Cooperative Neuro-generalized weakness, no new focal deficits, no tremors   Data Review:   Micro Results Recent Results (from the past 240 hour(s))  Urine Culture     Status: Abnormal   Collection Time: 12/25/19 12:45 AM   Specimen: Urine, Clean Catch  Result Value Ref Range Status   Specimen Description   Final    URINE, CLEAN CATCH Performed at [facility], [address]  [place], [place] [address]    Special Requests   Final    Normal Performed at [facility], [address], [place], [place] [address]    Culture (A)  Final    <10,000 COLONIES/mL INSIGNIFICANT GROWTH Performed at [facility], [address], [place], [place] [address]    Report Status 12/26/2019 FINAL  Final    Radiology Reports DG Chest Portable 1 View  Result Date: 12/23/2019 CLINICAL DATA:  Cough. EXAM: PORTABLE CHEST 1 VIEW COMPARISON:  11/29/2018 and 11/24/2018 FINDINGS: Chronic cardiomegaly with extensive aortic atherosclerosis. Pulmonary vascularity is within normal limits. Slight new atelectasis at the left base. Single tiny area of atelectasis at the right base. Pacemaker in place. Old compression fracture in the lower thoracic spine. IMPRESSION: New slight atelectasis at the left base. Minimal new atelectasis at the right base. Chronic cardiomegaly.  Aortic Atherosclerosis (ICD10-I70.0). Electronically Signed   By: Francene Boyers M.D.   On: 12/23/2019 13:12     CBC Recent Labs  Lab 12/30/19 0508  WBC 3.8*  HGB 13.4  HCT 42.1  PLT 179  MCV 91.9  MCH 29.3  MCHC 31.8  RDW 14.2    Chemistries  Recent Labs  Lab 12/27/19 0622 12/30/19 0508  NA 135 135  K 4.0 4.2  CL 102 104  CO2 25 24  GLUCOSE 166* 142*  BUN 21 22  CREATININE 0.60 0.65  CALCIUM 7.9* 8.3*   ------------------------------------------------------------------------------------------------------------------ No results for input(s): CHOL, HDL, LDLCALC, TRIG, CHOLHDL, LDLDIRECT in the last 72 hours.  No results found for: HGBA1C ------------------------------------------------------------------------------------------------------------------ No results for input(s): TSH, T4TOTAL, T3FREE, THYROIDAB in the last 72 hours.  Invalid input(s): FREET3 ------------------------------------------------------------------------------------------------------------------ No results for input(s): VITAMINB12,  FOLATE, FERRITIN, TIBC, IRON, RETICCTPCT in the last 72 hours.  Coagulation profile Recent Labs  Lab 12/28/19 0654 12/29/19 1231 12/30/19 0508 12/31/19 0656 01/01/20 0355  INR 2.9* 3.8* 3.7* 3.2* 3.1*    No results for input(s): DDIMER in the last 72 hours.  Cardiac Enzymes No results for input(s): CKMB, TROPONINI, MYOGLOBIN in the last 168 hours.  Invalid input(s): CK ------------------------------------------------------------------------------------------------------------------ No results found for: BNP  Resting , appears comfortable -appetite has improved -No new medical concerns  -Patient is medically stable, patient's daughter refusing to allow patient to go to SNF rehab . --  Patient remains medically stable for discharge -Patient is not requiring any hospital/inpatient level care at this time  -Apparently family discharge appeal was denied -Daughter Ms Jaleeyah Munce [phone]--- is agreeable to take patient home with home health services  --I called and discussed care plan with patient's daughter at length, questions answered -Discharge medications including Coumadin dosage which have been adjusted to 1 mg daily and the need for repeat INR discussed at length with patient's daughter -Patient's daughter verbalized understanding of discharge plan Shon Hale M.D on 01/02/2020 at 1:35 PM  Go to [email or web address] - for contact info  [facility] - Office  [phone]

## 2020-01-02 NOTE — TOC Transition Note (Signed)
Transition of Care [facility]) - CM/SW Discharge Note   Patient Details  Name: [name] MRN: [id number] Date of Birth: 10/05/1927  Transition of Care [facility]) CM/SW Contact:  Annice Needy, LCSW Phone Number: 01/02/2020, 10:41 AM   Clinical Narrative:    Spoke with son in law, [name], advised of discharge. Patient's daughter, [name], will arrive at patient's home for patient to be transported to the home by [facility] at 11:30.  Cory with [facility] notified that patient would discharge today on yesterday.  TOC signing off.    Final next level of care: Home w Home Health Services Barriers to Discharge: Family Issues, Other (comment)(Daughter appealing discharge)   Patient Goals and CMS Choice        Discharge Placement                       Discharge Plan and Services                          HH Arranged: PT HH Agency: [facility] Date [facility] Agency Contacted: 01/01/20 Time HH Agency Contacted: 1700 Representative spoke with at [facility] Agency: Kandee Keen  Social Determinants of Health (SDOH) Interventions     Readmission Risk Interventions No flowsheet data found.

## 2020-01-03 DIAGNOSIS — U071 COVID-19: Secondary | ICD-10-CM | POA: Diagnosis not present

## 2020-01-03 DIAGNOSIS — N390 Urinary tract infection, site not specified: Secondary | ICD-10-CM | POA: Diagnosis not present

## 2020-01-03 DIAGNOSIS — I480 Paroxysmal atrial fibrillation: Secondary | ICD-10-CM | POA: Diagnosis not present

## 2020-01-03 DIAGNOSIS — J9601 Acute respiratory failure with hypoxia: Secondary | ICD-10-CM | POA: Diagnosis not present

## 2020-01-04 DIAGNOSIS — J9601 Acute respiratory failure with hypoxia: Secondary | ICD-10-CM | POA: Diagnosis not present

## 2020-01-04 DIAGNOSIS — N3000 Acute cystitis without hematuria: Secondary | ICD-10-CM | POA: Diagnosis not present

## 2020-01-04 DIAGNOSIS — F0390 Unspecified dementia without behavioral disturbance: Secondary | ICD-10-CM | POA: Diagnosis not present

## 2020-01-04 DIAGNOSIS — J1282 Pneumonia due to coronavirus disease 2019: Secondary | ICD-10-CM | POA: Diagnosis not present

## 2020-01-04 DIAGNOSIS — I10 Essential (primary) hypertension: Secondary | ICD-10-CM | POA: Diagnosis not present

## 2020-01-04 DIAGNOSIS — N3941 Urge incontinence: Secondary | ICD-10-CM | POA: Diagnosis not present

## 2020-01-04 DIAGNOSIS — U071 COVID-19: Secondary | ICD-10-CM | POA: Diagnosis not present

## 2020-01-04 DIAGNOSIS — I700 Atherosclerosis of aorta: Secondary | ICD-10-CM | POA: Diagnosis not present

## 2020-01-04 DIAGNOSIS — I482 Chronic atrial fibrillation, unspecified: Secondary | ICD-10-CM | POA: Diagnosis not present

## 2020-01-07 DIAGNOSIS — U071 COVID-19: Secondary | ICD-10-CM | POA: Diagnosis not present

## 2020-01-07 DIAGNOSIS — F0390 Unspecified dementia without behavioral disturbance: Secondary | ICD-10-CM | POA: Diagnosis not present

## 2020-01-07 DIAGNOSIS — N3000 Acute cystitis without hematuria: Secondary | ICD-10-CM | POA: Diagnosis not present

## 2020-01-07 DIAGNOSIS — N3941 Urge incontinence: Secondary | ICD-10-CM | POA: Diagnosis not present

## 2020-01-07 DIAGNOSIS — I10 Essential (primary) hypertension: Secondary | ICD-10-CM | POA: Diagnosis not present

## 2020-01-07 DIAGNOSIS — J9601 Acute respiratory failure with hypoxia: Secondary | ICD-10-CM | POA: Diagnosis not present

## 2020-01-07 DIAGNOSIS — I700 Atherosclerosis of aorta: Secondary | ICD-10-CM | POA: Diagnosis not present

## 2020-01-07 DIAGNOSIS — I482 Chronic atrial fibrillation, unspecified: Secondary | ICD-10-CM | POA: Diagnosis not present

## 2020-01-07 DIAGNOSIS — J1282 Pneumonia due to coronavirus disease 2019: Secondary | ICD-10-CM | POA: Diagnosis not present

## 2020-01-09 DIAGNOSIS — U071 COVID-19: Secondary | ICD-10-CM | POA: Diagnosis not present

## 2020-01-09 DIAGNOSIS — I700 Atherosclerosis of aorta: Secondary | ICD-10-CM | POA: Diagnosis not present

## 2020-01-09 DIAGNOSIS — I482 Chronic atrial fibrillation, unspecified: Secondary | ICD-10-CM | POA: Diagnosis not present

## 2020-01-09 DIAGNOSIS — N3000 Acute cystitis without hematuria: Secondary | ICD-10-CM | POA: Diagnosis not present

## 2020-01-09 DIAGNOSIS — I10 Essential (primary) hypertension: Secondary | ICD-10-CM | POA: Diagnosis not present

## 2020-01-09 DIAGNOSIS — J1282 Pneumonia due to coronavirus disease 2019: Secondary | ICD-10-CM | POA: Diagnosis not present

## 2020-01-09 DIAGNOSIS — N3941 Urge incontinence: Secondary | ICD-10-CM | POA: Diagnosis not present

## 2020-01-09 DIAGNOSIS — F0390 Unspecified dementia without behavioral disturbance: Secondary | ICD-10-CM | POA: Diagnosis not present

## 2020-01-09 DIAGNOSIS — J9601 Acute respiratory failure with hypoxia: Secondary | ICD-10-CM | POA: Diagnosis not present

## 2020-01-10 ENCOUNTER — Ambulatory Visit: Payer: Medicare HMO | Admitting: Urology

## 2020-01-10 ENCOUNTER — Telehealth: Payer: Self-pay

## 2020-01-10 ENCOUNTER — Telehealth: Payer: Self-pay | Admitting: Cardiovascular Disease

## 2020-01-10 ENCOUNTER — Ambulatory Visit (INDEPENDENT_AMBULATORY_CARE_PROVIDER_SITE_OTHER): Payer: Medicare HMO | Admitting: Pharmacist Clinician (PhC)/ Clinical Pharmacy Specialist

## 2020-01-10 DIAGNOSIS — Z7901 Long term (current) use of anticoagulants: Secondary | ICD-10-CM | POA: Diagnosis not present

## 2020-01-10 DIAGNOSIS — I480 Paroxysmal atrial fibrillation: Secondary | ICD-10-CM | POA: Diagnosis not present

## 2020-01-10 DIAGNOSIS — I4821 Permanent atrial fibrillation: Secondary | ICD-10-CM | POA: Diagnosis not present

## 2020-01-10 LAB — POCT INR: INR: 1.1 — AB (ref 2.0–3.0)

## 2020-01-10 NOTE — Telephone Encounter (Signed)
Spoke with [name] - reviewed meds, see anticoag

## 2020-01-10 NOTE — Telephone Encounter (Signed)
PCP office contacted. Verbal order received for hospice eval and confirmed Dr. Felecia Shelling will remain attending. Jenn in Hospice referrals updated.

## 2020-01-10 NOTE — Telephone Encounter (Signed)
New Message   Patients daughter is calling because the patient was recently in the hospital and a lot of changes were made to her medication. She states that she checked the INR this morning and it was 1.1. She is just very concerned. I did try to reach out to the coumadin clinic with no success and the daughter hung up. I just decided to send a message due to her overall concern

## 2020-01-10 NOTE — Telephone Encounter (Signed)
Received phone call from [place] with [facility]. Patient has been declining and believes patient is ready for hospice care. Debbie shared that family is in agreement with this. Will speak with Hospice AV team.

## 2020-01-13 ENCOUNTER — Ambulatory Visit (INDEPENDENT_AMBULATORY_CARE_PROVIDER_SITE_OTHER): Admitting: *Deleted

## 2020-01-13 DIAGNOSIS — I480 Paroxysmal atrial fibrillation: Secondary | ICD-10-CM

## 2020-01-13 LAB — CUP PACEART REMOTE DEVICE CHECK
Battery Impedance: 880 Ohm
Battery Remaining Longevity: 73 mo
Battery Voltage: 2.79 V
Brady Statistic RV Percent Paced: 40 %
Date Time Interrogation Session: 20210215132852
Implantable Lead Implant Date: 20071023
Implantable Lead Implant Date: 20071023
Implantable Lead Location: 753859
Implantable Lead Location: 753860
Implantable Lead Model: 4092
Implantable Lead Model: 5594
Implantable Pulse Generator Implant Date: 20140808
Lead Channel Impedance Value: 67 Ohm
Lead Channel Impedance Value: 675 Ohm
Lead Channel Pacing Threshold Amplitude: 0.625 V
Lead Channel Pacing Threshold Pulse Width: 0.4 ms
Lead Channel Setting Pacing Amplitude: 2 V
Lead Channel Setting Pacing Pulse Width: 0.4 ms
Lead Channel Setting Sensing Sensitivity: 5.6 mV

## 2020-01-14 DIAGNOSIS — N3000 Acute cystitis without hematuria: Secondary | ICD-10-CM | POA: Diagnosis not present

## 2020-01-14 DIAGNOSIS — J9601 Acute respiratory failure with hypoxia: Secondary | ICD-10-CM | POA: Diagnosis not present

## 2020-01-14 DIAGNOSIS — I10 Essential (primary) hypertension: Secondary | ICD-10-CM | POA: Diagnosis not present

## 2020-01-14 DIAGNOSIS — J1282 Pneumonia due to coronavirus disease 2019: Secondary | ICD-10-CM | POA: Diagnosis not present

## 2020-01-14 DIAGNOSIS — U071 COVID-19: Secondary | ICD-10-CM | POA: Diagnosis not present

## 2020-01-14 DIAGNOSIS — I700 Atherosclerosis of aorta: Secondary | ICD-10-CM | POA: Diagnosis not present

## 2020-01-14 DIAGNOSIS — F0390 Unspecified dementia without behavioral disturbance: Secondary | ICD-10-CM | POA: Diagnosis not present

## 2020-01-14 DIAGNOSIS — N3941 Urge incontinence: Secondary | ICD-10-CM | POA: Diagnosis not present

## 2020-01-14 DIAGNOSIS — I482 Chronic atrial fibrillation, unspecified: Secondary | ICD-10-CM | POA: Diagnosis not present

## 2020-01-14 NOTE — Progress Notes (Signed)
PPM Remote  

## 2020-01-15 DIAGNOSIS — R404 Transient alteration of awareness: Secondary | ICD-10-CM | POA: Diagnosis not present

## 2020-01-15 DIAGNOSIS — R58 Hemorrhage, not elsewhere classified: Secondary | ICD-10-CM | POA: Diagnosis not present

## 2020-01-15 DIAGNOSIS — R5381 Other malaise: Secondary | ICD-10-CM | POA: Diagnosis not present

## 2020-01-15 DIAGNOSIS — R1111 Vomiting without nausea: Secondary | ICD-10-CM | POA: Diagnosis not present

## 2020-01-27 DIAGNOSIS — 419620001 Death: Secondary | SNOMED CT | POA: Diagnosis not present

## 2020-01-27 DEATH — deceased

## 2020-01-31 ENCOUNTER — Ambulatory Visit: Payer: Medicare HMO | Admitting: Cardiovascular Disease

## 2020-03-06 ENCOUNTER — Ambulatory Visit: Payer: Medicare HMO | Admitting: Urology
